# Patient Record
Sex: Female | Born: 1950 | Race: Black or African American | Hispanic: No | Marital: Single | Smoking: Never smoker
Health system: Southern US, Community
[De-identification: ages and names within clinical notes are randomized; demographics above are authoritative.]

## PROBLEM LIST (undated history)

## (undated) DIAGNOSIS — E785 Hyperlipidemia, unspecified: Secondary | ICD-10-CM

## (undated) DIAGNOSIS — M6281 Muscle weakness (generalized): Secondary | ICD-10-CM

## (undated) DIAGNOSIS — I69991 Dysphagia following unspecified cerebrovascular disease: Secondary | ICD-10-CM

## (undated) DIAGNOSIS — R1312 Dysphagia, oropharyngeal phase: Secondary | ICD-10-CM

## (undated) DIAGNOSIS — I10 Essential (primary) hypertension: Secondary | ICD-10-CM

## (undated) DIAGNOSIS — R41841 Cognitive communication deficit: Secondary | ICD-10-CM

## (undated) DIAGNOSIS — G8191 Hemiplegia, unspecified affecting right dominant side: Secondary | ICD-10-CM

## (undated) DIAGNOSIS — Z853 Personal history of malignant neoplasm of breast: Secondary | ICD-10-CM

## (undated) DIAGNOSIS — R2689 Other abnormalities of gait and mobility: Secondary | ICD-10-CM

## (undated) DIAGNOSIS — I639 Cerebral infarction, unspecified: Secondary | ICD-10-CM

---

## 2021-05-25 ENCOUNTER — Other Ambulatory Visit: Payer: Self-pay

## 2021-05-25 ENCOUNTER — Inpatient Hospital Stay (HOSPITAL_COMMUNITY): Payer: Medicare HMO

## 2021-05-25 ENCOUNTER — Inpatient Hospital Stay (HOSPITAL_COMMUNITY)
Admission: EM | Admit: 2021-05-25 | Discharge: 2021-05-27 | DRG: 065 | Disposition: A | Discharge status: Skilled Nursing Facility | Payer: Medicare HMO | Attending: Internal Medicine | Admitting: Internal Medicine

## 2021-05-25 ENCOUNTER — Emergency Department (HOSPITAL_COMMUNITY): Payer: Medicare HMO

## 2021-05-25 ENCOUNTER — Other Ambulatory Visit (HOSPITAL_COMMUNITY): Payer: Medicare HMO

## 2021-05-25 ENCOUNTER — Encounter: Payer: Self-pay | Admitting: Internal Medicine

## 2021-05-25 ENCOUNTER — Encounter (HOSPITAL_COMMUNITY): Payer: Self-pay | Admitting: Internal Medicine

## 2021-05-25 DIAGNOSIS — R29713 NIHSS score 13: Secondary | ICD-10-CM | POA: Diagnosis present

## 2021-05-25 DIAGNOSIS — Z20822 Contact with and (suspected) exposure to covid-19: Secondary | ICD-10-CM | POA: Diagnosis present

## 2021-05-25 DIAGNOSIS — E785 Hyperlipidemia, unspecified: Secondary | ICD-10-CM | POA: Diagnosis present

## 2021-05-25 DIAGNOSIS — R471 Dysarthria and anarthria: Secondary | ICD-10-CM | POA: Diagnosis present

## 2021-05-25 DIAGNOSIS — I69351 Hemiplegia and hemiparesis following cerebral infarction affecting right dominant side: Secondary | ICD-10-CM

## 2021-05-25 DIAGNOSIS — I69320 Aphasia following cerebral infarction: Secondary | ICD-10-CM

## 2021-05-25 DIAGNOSIS — Z79899 Other long term (current) drug therapy: Secondary | ICD-10-CM | POA: Diagnosis not present

## 2021-05-25 DIAGNOSIS — Z853 Personal history of malignant neoplasm of breast: Secondary | ICD-10-CM

## 2021-05-25 DIAGNOSIS — R29810 Facial weakness: Secondary | ICD-10-CM | POA: Diagnosis present

## 2021-05-25 DIAGNOSIS — R1310 Dysphagia, unspecified: Secondary | ICD-10-CM | POA: Diagnosis present

## 2021-05-25 DIAGNOSIS — G9340 Encephalopathy, unspecified: Secondary | ICD-10-CM | POA: Diagnosis present

## 2021-05-25 DIAGNOSIS — I639 Cerebral infarction, unspecified: Secondary | ICD-10-CM | POA: Diagnosis present

## 2021-05-25 DIAGNOSIS — I63511 Cerebral infarction due to unspecified occlusion or stenosis of right middle cerebral artery: Secondary | ICD-10-CM | POA: Diagnosis present

## 2021-05-25 DIAGNOSIS — Z7902 Long term (current) use of antithrombotics/antiplatelets: Secondary | ICD-10-CM

## 2021-05-25 DIAGNOSIS — I6389 Other cerebral infarction: Secondary | ICD-10-CM | POA: Diagnosis not present

## 2021-05-25 DIAGNOSIS — R569 Unspecified convulsions: Secondary | ICD-10-CM | POA: Diagnosis present

## 2021-05-25 DIAGNOSIS — R258 Other abnormal involuntary movements: Secondary | ICD-10-CM | POA: Diagnosis present

## 2021-05-25 DIAGNOSIS — I10 Essential (primary) hypertension: Secondary | ICD-10-CM | POA: Insufficient documentation

## 2021-05-25 DIAGNOSIS — Z888 Allergy status to other drugs, medicaments and biological substances status: Secondary | ICD-10-CM | POA: Diagnosis not present

## 2021-05-25 HISTORY — DX: Hyperlipidemia, unspecified: E78.5

## 2021-05-25 HISTORY — DX: Cerebral infarction, unspecified: I63.9

## 2021-05-25 HISTORY — DX: Essential (primary) hypertension: I10

## 2021-05-25 LAB — POCT I-STAT, CHEM 8
BUN: 11 mg/dL (ref 8–23)
Calcium, Ion: 1.17 mmol/L (ref 1.15–1.40)
Chloride: 108 mmol/L (ref 98–111)
Creatinine, Ser: 0.7 mg/dL (ref 0.44–1.00)
Glucose, Bld: 117 mg/dL — ABNORMAL HIGH (ref 70–99)
HCT: 43 % (ref 36.0–46.0)
Hemoglobin: 14.6 g/dL (ref 12.0–15.0)
Potassium: 4 mmol/L (ref 3.5–5.1)
Sodium: 144 mmol/L (ref 135–145)
TCO2: 26 mmol/L (ref 22–32)

## 2021-05-25 LAB — COMPREHENSIVE METABOLIC PANEL
ALT: 20 U/L (ref 0–44)
AST: 22 U/L (ref 15–41)
Albumin: 3.4 g/dL — ABNORMAL LOW (ref 3.5–5.0)
Alkaline Phosphatase: 89 U/L (ref 38–126)
Anion gap: 10 (ref 5–15)
BUN: 10 mg/dL (ref 8–23)
CO2: 24 mmol/L (ref 22–32)
Calcium: 9.5 mg/dL (ref 8.9–10.3)
Chloride: 109 mmol/L (ref 98–111)
Creatinine, Ser: 0.77 mg/dL (ref 0.44–1.00)
GFR, Estimated: >60 mL/min (ref >60)
Glucose, Bld: 116 mg/dL — ABNORMAL HIGH (ref 70–99)
Potassium: 4.1 mmol/L (ref 3.5–5.1)
Sodium: 143 mmol/L (ref 135–145)
Total Bilirubin: 0.3 mg/dL (ref 0.3–1.2)
Total Protein: 6.8 g/dL (ref 6.5–8.1)

## 2021-05-25 LAB — CBC
HCT: 42.4 % (ref 36.0–46.0)
Hemoglobin: 12.7 g/dL (ref 12.0–15.0)
MCH: 28 pg (ref 26.0–34.0)
MCHC: 30 g/dL (ref 30.0–36.0)
MCV: 93.6 fL (ref 80.0–100.0)
Platelets: 280 10*3/uL (ref 150–400)
RBC: 4.53 MIL/uL (ref 3.87–5.11)
RDW: 14.4 % (ref 11.5–15.5)
WBC: 6.6 10*3/uL (ref 4.0–10.5)
nRBC: 0 % (ref 0.0–0.2)

## 2021-05-25 LAB — DIFFERENTIAL
Abs Immature Granulocytes: 0.01 10*3/uL (ref 0.00–0.07)
Basophils Absolute: 0 10*3/uL (ref 0.0–0.1)
Basophils Relative: 1 %
Eosinophils Absolute: 0.1 10*3/uL (ref 0.0–0.5)
Eosinophils Relative: 2 %
Immature Granulocytes: 0 %
Lymphocytes Relative: 36 %
Lymphs Abs: 2.4 10*3/uL (ref 0.7–4.0)
Monocytes Absolute: 0.5 10*3/uL (ref 0.1–1.0)
Monocytes Relative: 7 %
Neutro Abs: 3.6 10*3/uL (ref 1.7–7.7)
Neutrophils Relative %: 54 %

## 2021-05-25 LAB — PROTIME-INR
INR: 1.1 (ref 0.8–1.2)
Prothrombin Time: 13.8 seconds (ref 11.4–15.2)

## 2021-05-25 LAB — RESP PANEL BY RT-PCR (FLU A&B, COVID) ARPGX2
Influenza A by PCR: NEGATIVE
Influenza B by PCR: NEGATIVE
SARS Coronavirus 2 by RT PCR: NEGATIVE

## 2021-05-25 LAB — ETHANOL: Alcohol, Ethyl (B): <10 mg/dL (ref <10)

## 2021-05-25 LAB — CBG MONITORING, ED: Glucose-Capillary: 121 mg/dL — ABNORMAL HIGH (ref 70–99)

## 2021-05-25 LAB — APTT: aPTT: 26 seconds (ref 24–36)

## 2021-05-25 IMAGING — MR MR HEAD W/O CM
4 series · 48 of 48 positions shown · non-contrast
Comparison: Noncontrast head CT performed earlier today [DATE].
Brain MRI [DATE]. CT angiogram head/neck [DATE].

CLINICAL DATA: Neuro deficit, acute, stroke suspected. Additional
history provided: Patient reports worsening right-sided weakness and
aphasia.

EXAM:
MRI HEAD WITHOUT CONTRAST
TECHNIQUE: Multiplanar, multiecho pulse sequences of the brain and surrounding
structures were obtained without intravenous contrast.

[Series 4: DWI · axial · 3.0mm · 1.09mm/px · z∈[-71,+78]mm · 18 of 102 slices shown (1 of 4)]
[im 1/102]
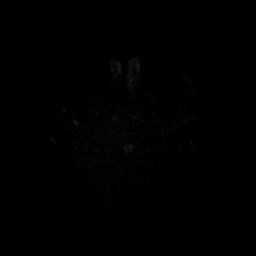
[im 6/102]
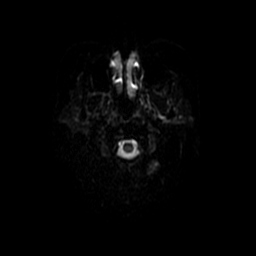
[im 12/102]
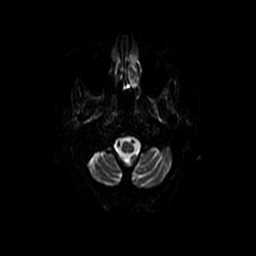
[im 18/102]
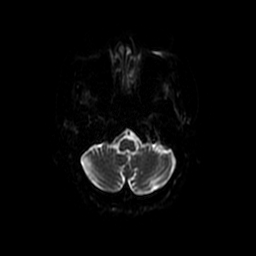
[im 24/102]
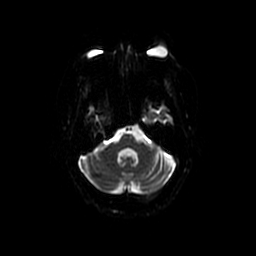
[im 30/102]
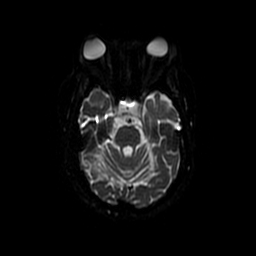
[im 36/102]
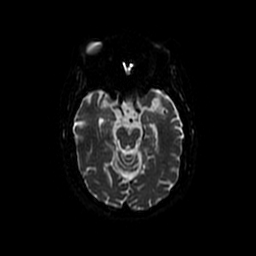
[im 42/102]
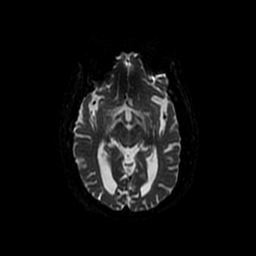
[im 48/102]
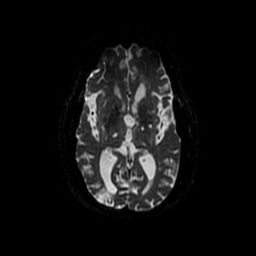
[im 54/102]
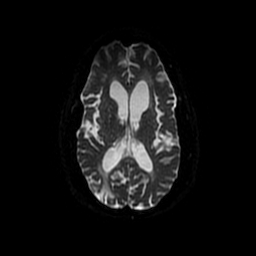
[im 60/102]
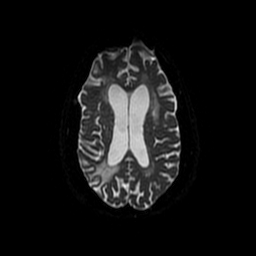
[im 66/102]
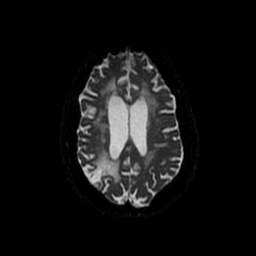
[im 72/102]
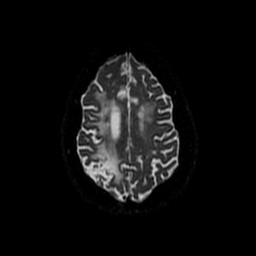
[im 78/102]
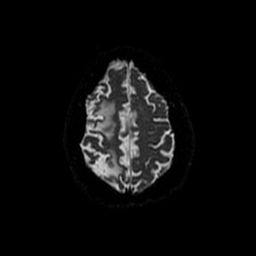
[im 84/102]
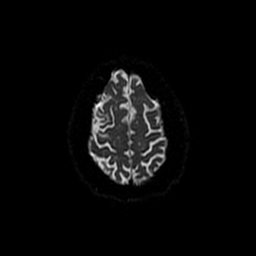
[im 90/102]
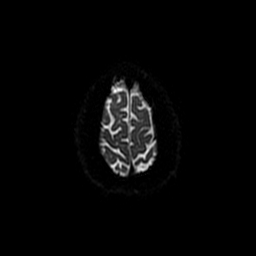
[im 96/102]
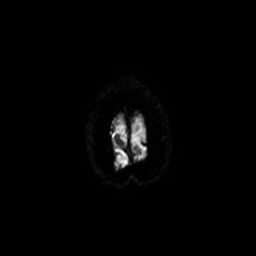
[im 102/102]
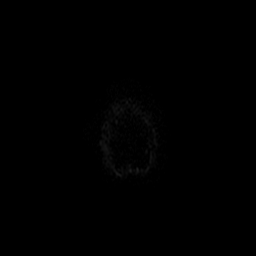

[Series 10: DWI · coronal · 5.0mm · 1.09mm/px · 14 of 80 slices shown (2 of 4)]
[im 1/80]
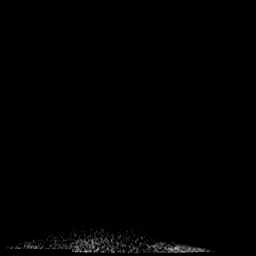
[im 7/80]
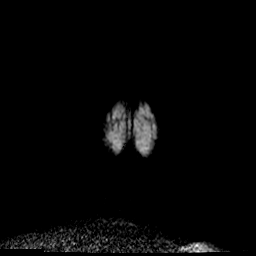
[im 13/80]
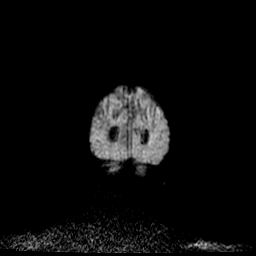
[im 19/80]
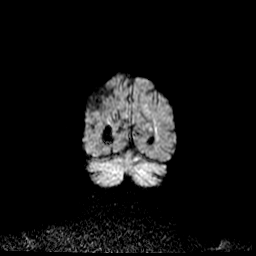
[im 25/80]
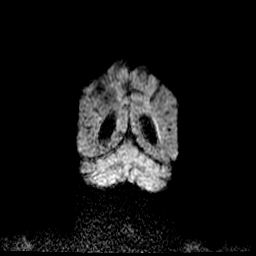
[im 31/80]
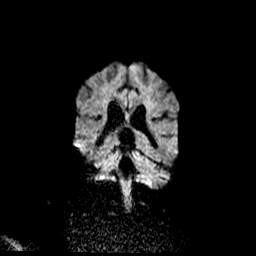
[im 37/80]
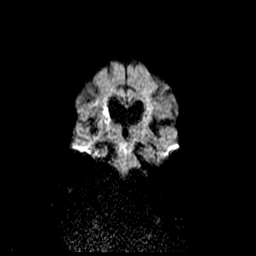
[im 43/80]
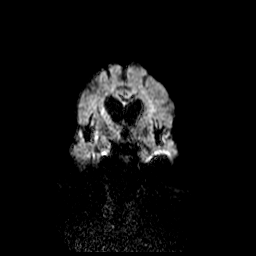
[im 49/80]
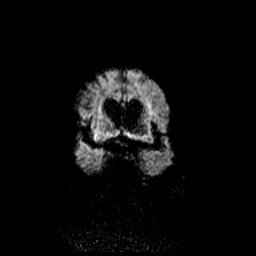
[im 55/80]
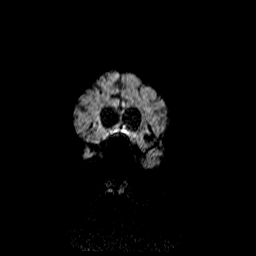
[im 61/80]
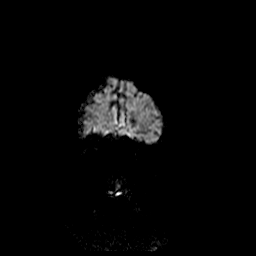
[im 67/80]
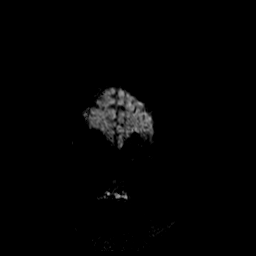
[im 73/80]
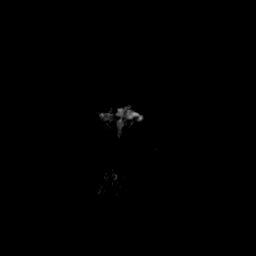
[im 80/80]
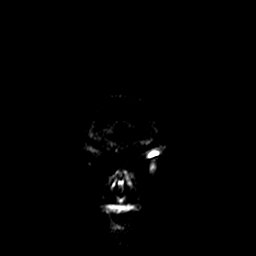

[Series 400: DWI · axial · 3.0mm · 1.09mm/px · z∈[-71,+78]mm · 9 of 51 slices shown (3 of 4)]
[im 1/51]
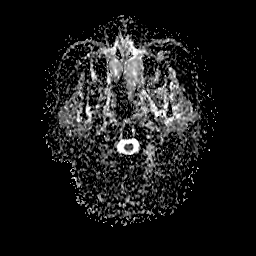
[im 7/51]
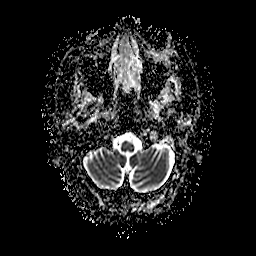
[im 13/51]
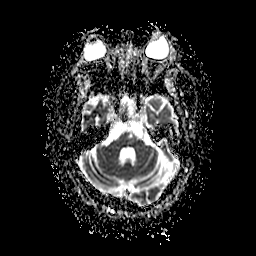
[im 19/51]
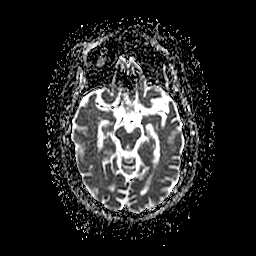
[im 26/51]
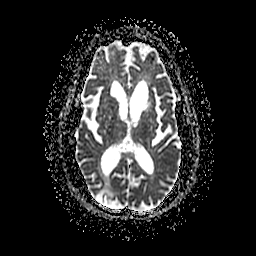
[im 32/51]
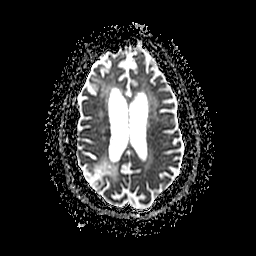
[im 38/51]
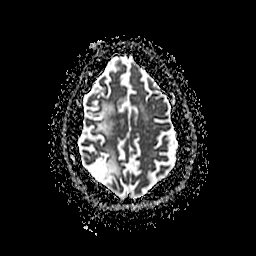
[im 44/51]
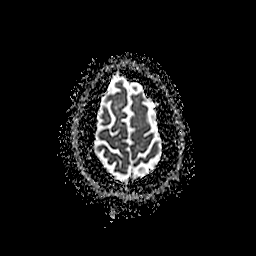
[im 51/51]
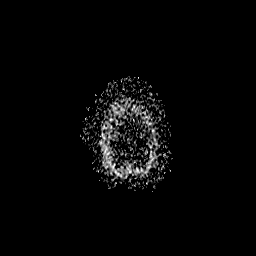

[Series 1000: DWI · coronal · 5.0mm · 1.09mm/px · 7 of 40 slices shown (4 of 4)]
[im 1/40]
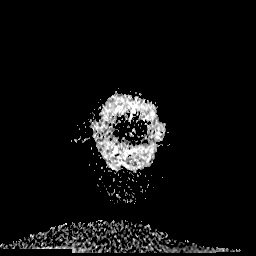
[im 7/40]
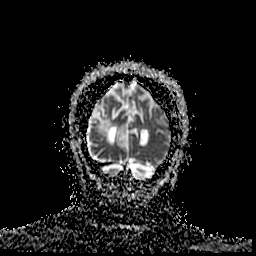
[im 14/40]
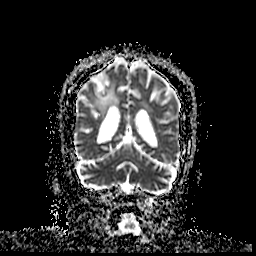
[im 20/40]
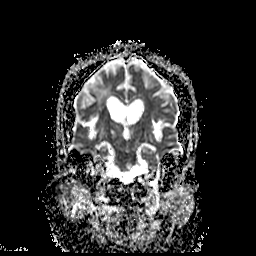
[im 27/40]
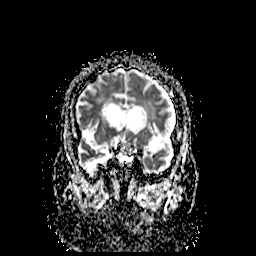
[im 33/40]
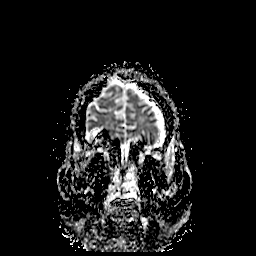
[im 40/40]
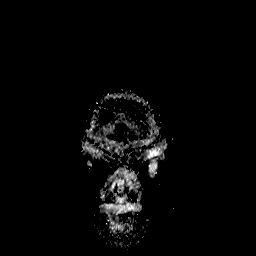

[48 of 48 positions shown; findings below may reference images not displayed]

FINDINGS: The patient was unable to tolerate the full examination. As a
result, only axial and coronal diffusion-weighted sequences (and the
corresponding ADC sequences) could be obtained. There is moderate
motion degradation of the coronal diffusion-weighted sequence. 2.0 x
0.8 x 1.8 cm focus of restricted diffusion within the right corona
radiata compatible with acute infarction.

No acute infarct is identified elsewhere within the brain.
IMPRESSION: The patient was unable to tolerate the full examination. As a
result, only diffusion-weighted imaging could be obtained. The
acquired coronal diffusion-weighted sequence is moderately motion
degraded.

2.0 x 0.8 x 1.8 cm acute infarct within the right corona radiata.

No acute infarct identified elsewhere within the brain.

## 2021-05-25 IMAGING — MR MR MRA NECK W/O CM
3 series · 19 of 48 positions shown · non-contrast
Comparison: CT angiogram neck [DATE].

EXAM:
MRA NECK WITHOUT CONTRAST
TECHNIQUE: Multiplanar and multiecho pulse sequences of the neck were obtained
without intravenous contrast. Angiographic images of the neck were
obtained using MRA technique without and with intravenous contrast.

[Series 8: ax (id) · axial · 2.8mm · 0.47mm/px · z∈[-258,-74]mm · 12 of 140 slices shown]
[im 1/140]
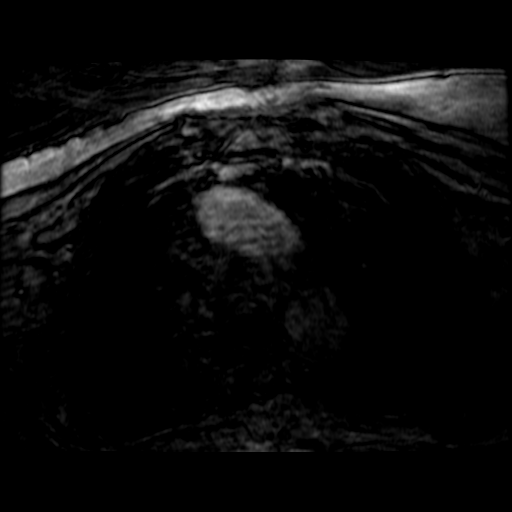
[im 7/140]
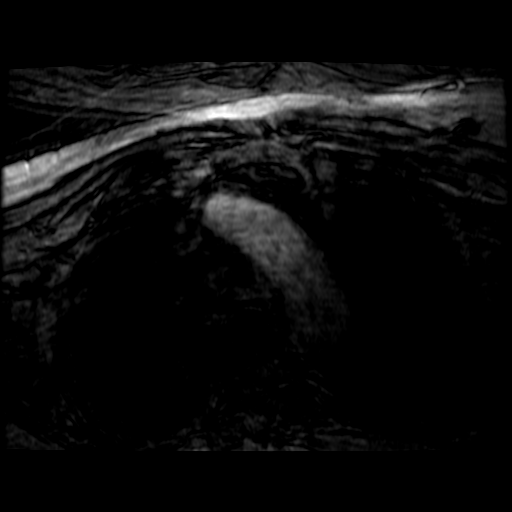
[im 21/140]
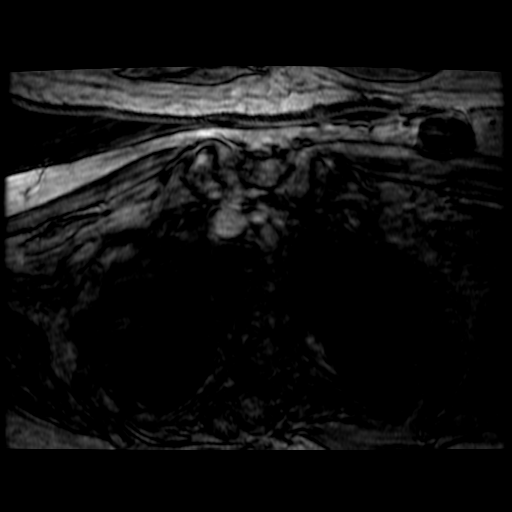
[im 25/140]
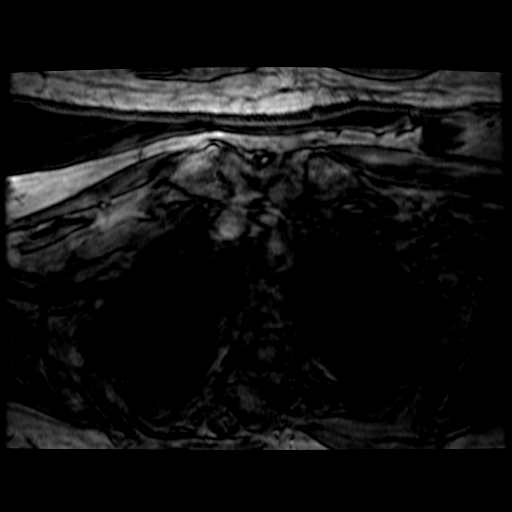
[im 42/140]
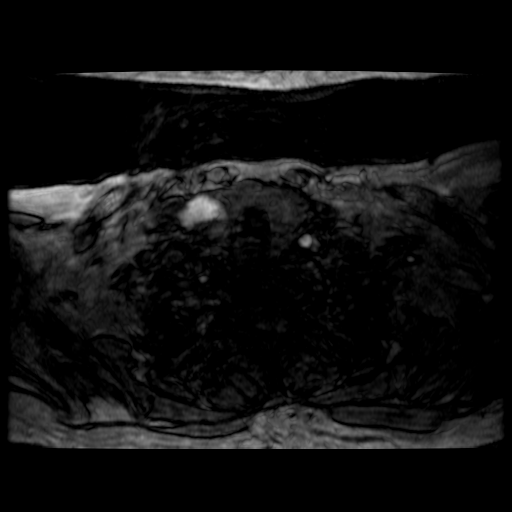
[im 60/140]
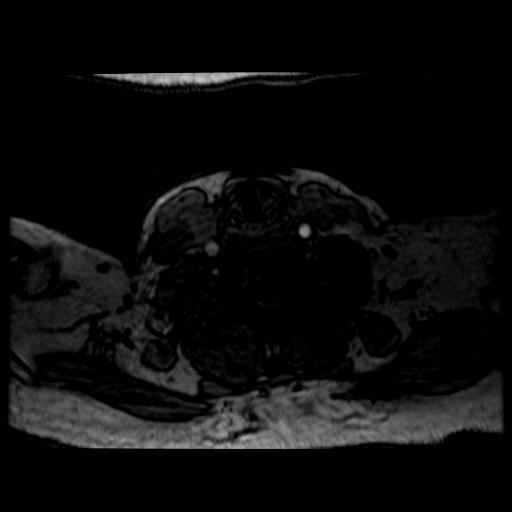
[im 70/140]
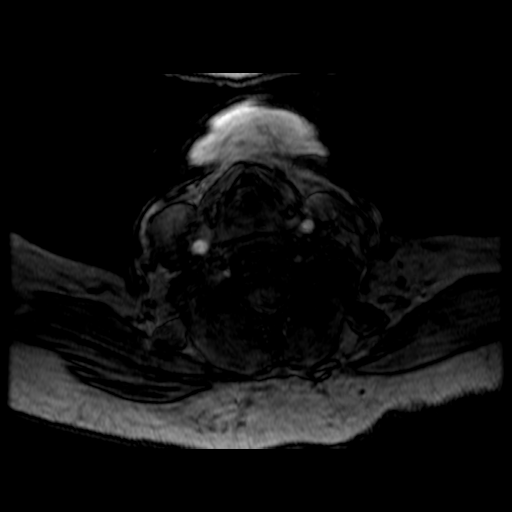
[im 80/140]
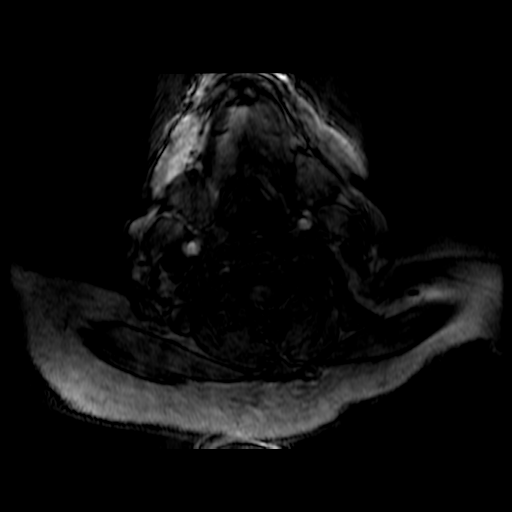
[im 98/140]
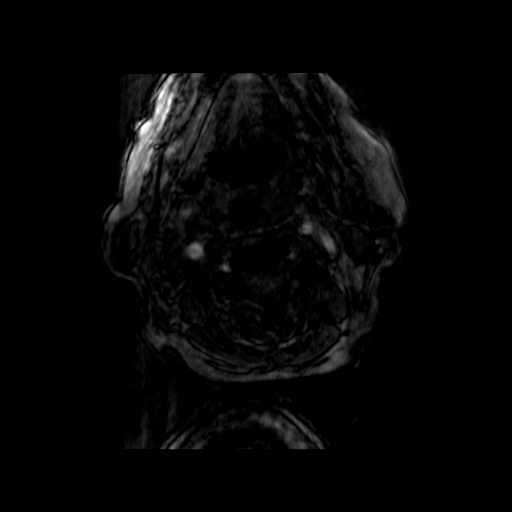
[im 115/140]
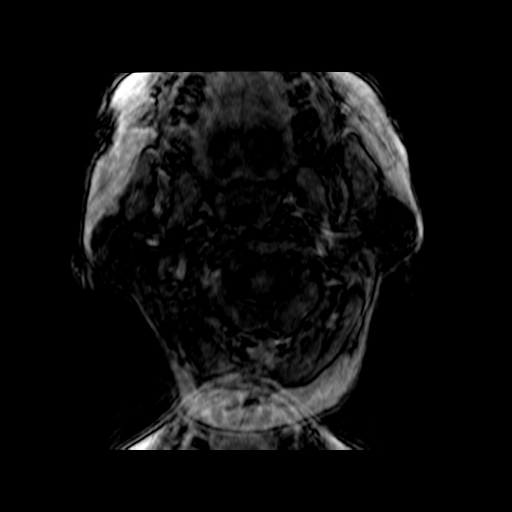
[im 119/140]
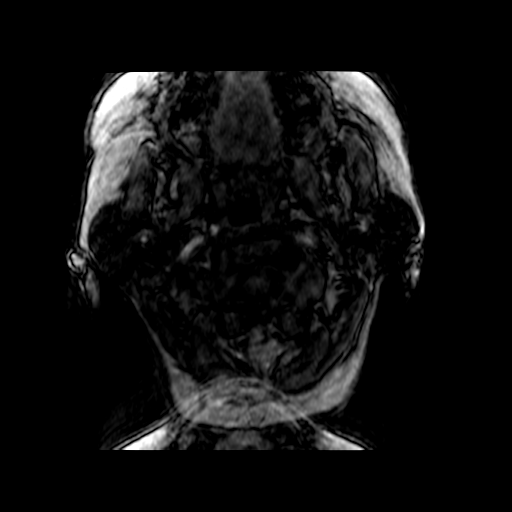
[im 133/140]
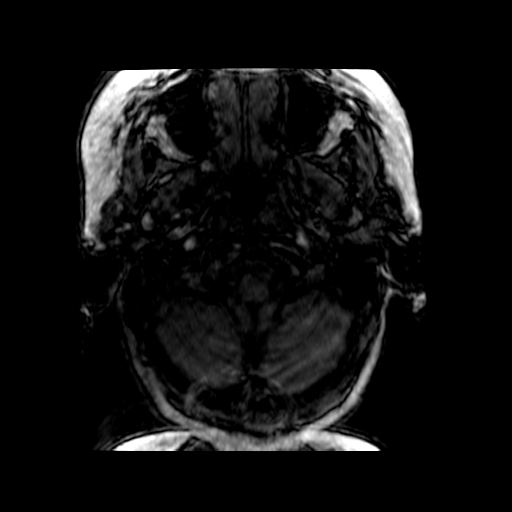

[Series 800: col:ax (id) · axial · 2.8mm · 0.47mm/px · 1 of 1 slices shown]
[im 1/1]
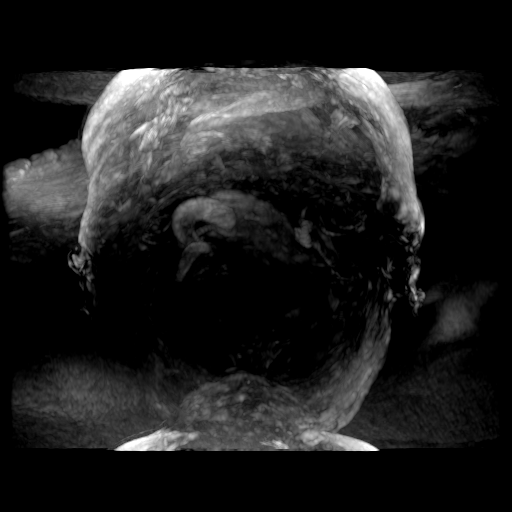

[Series 801: pjn:ax (id) · sagittal · 2.8mm · 0.47mm/px · 6 of 19 slices shown]
[im 1/19]
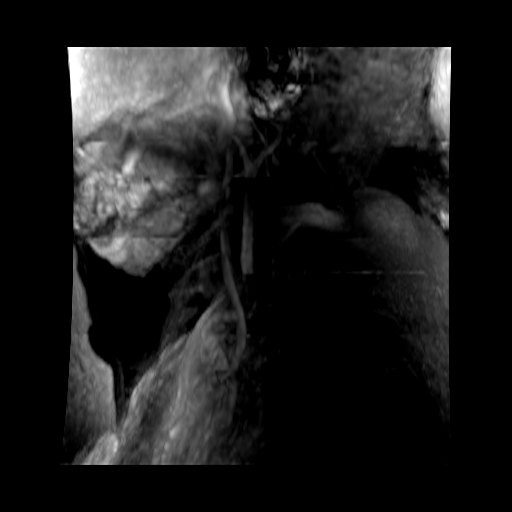
[im 4/19]
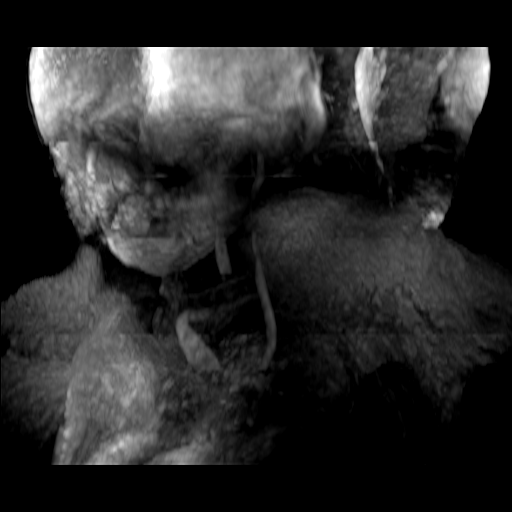
[im 8/19]
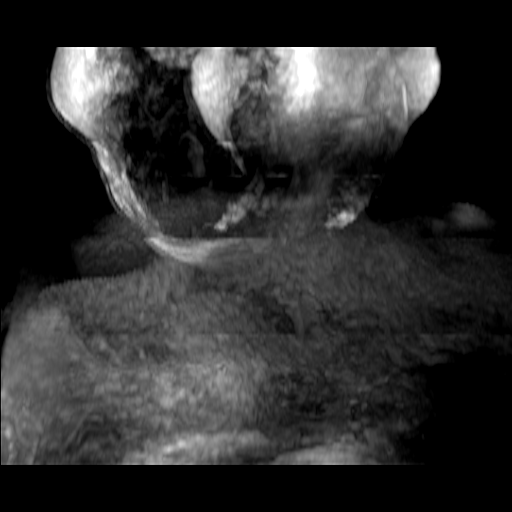
[im 11/19]
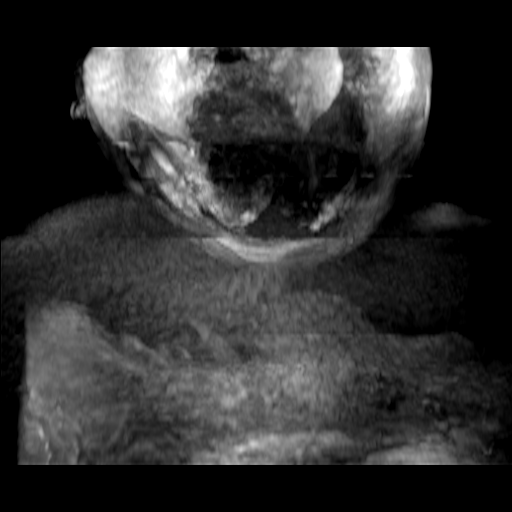
[im 15/19]
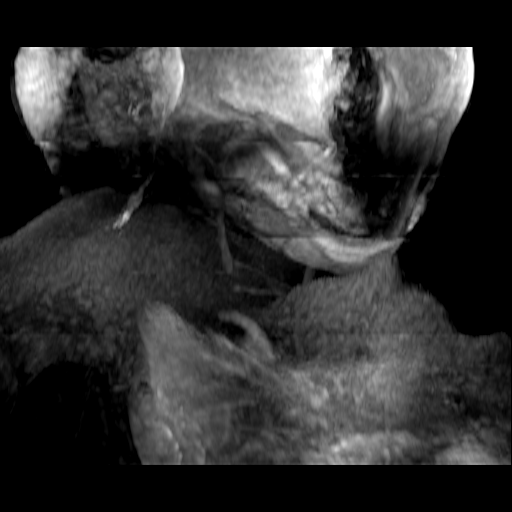
[im 19/19]
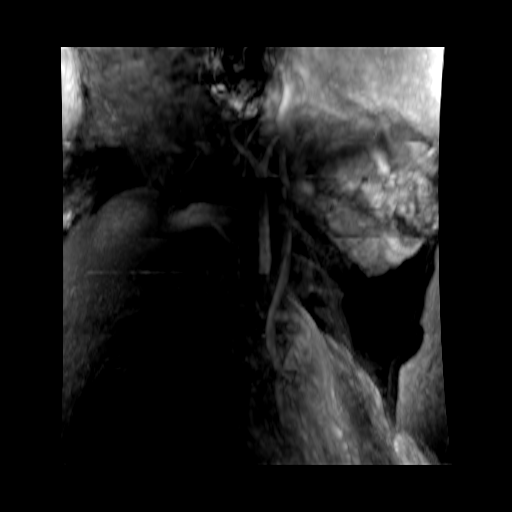

[19 of 48 positions shown; findings below may reference images not displayed]

FINDINGS: Examination significantly limited by moderate to severe motion
degradation and noncontrast technique. The common carotid and
internal carotid arteries are patent within the neck. Within
described limitations, no definite hemodynamically significant
stenosis of the common carotid or internal carotid arteries within
the neck is identified.

The vertebral arteries are patent within the neck with antegrade
flow bilaterally. Motion degradation significantly limits evaluation
for stenoses within these vessels. The right vertebral artery is
dominant.
IMPRESSION: Examination significantly limited by moderate to severe motion
degradation and non-contrast technique.

The common carotid and internal carotid arteries are patent within
the neck. Within described limitations, no definite hemodynamically
significant stenosis of the common carotid or internal carotid
arteries is identified within the neck.

The vertebral arteries are patent within the neck with antegrade
flow bilaterally. Motion degradation significantly limits evaluation
for stenoses within these vessels.

## 2021-05-25 IMAGING — CT CT HEAD CODE STROKE
4 series · 16 of 47 positions shown, 18 images · non-contrast
Comparison: None.

CLINICAL DATA: Code stroke.  Comment

EXAM:
CT HEAD WITHOUT CONTRAST
TECHNIQUE: Contiguous axial images were obtained from the base of the skull
through the vertex without intravenous contrast.

[Series 3: head 5.0 st · axial · 0.48mm/px · z∈[-76,+44]mm · 7 of 33 slices shown, 9 images]
[im 5/33  brain]
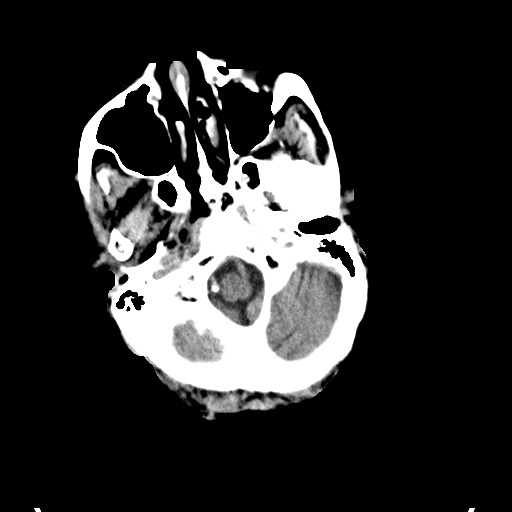
[im 5/33  bone]
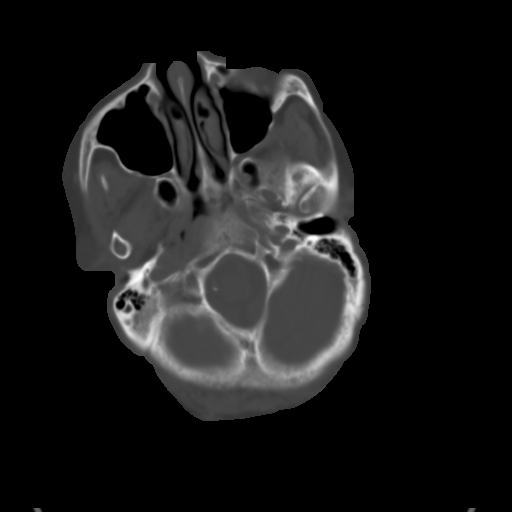
[im 9/33  brain]
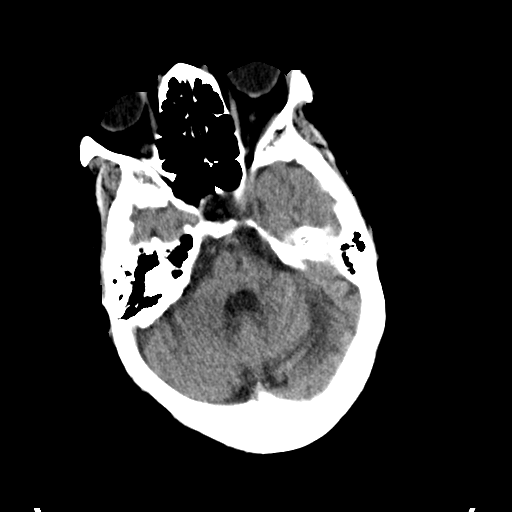
[im 13/33  brain]
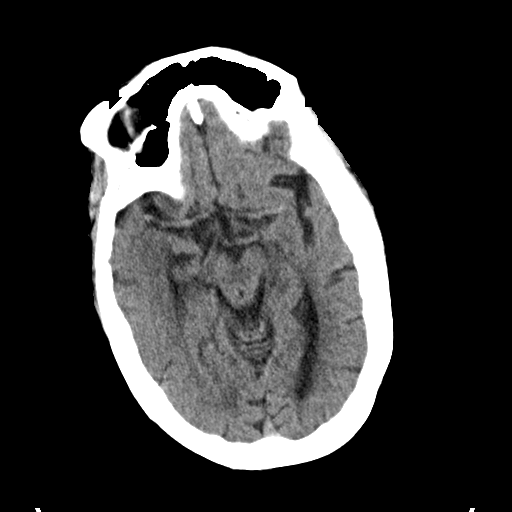
[im 17/33  brain]
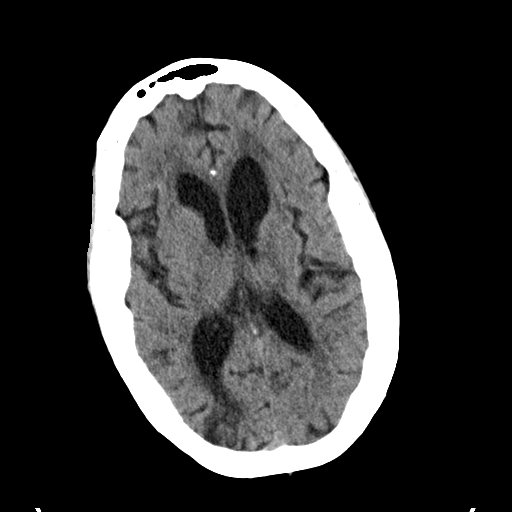
[im 21/33  brain]
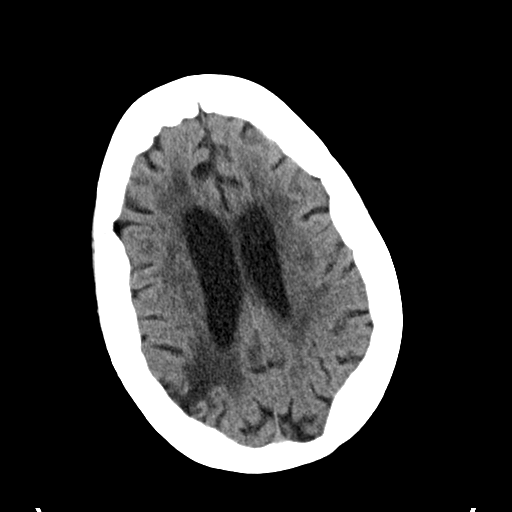
[im 21/33  bone]
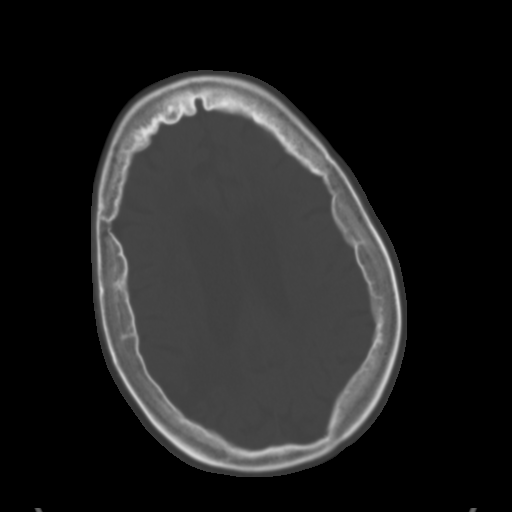
[im 25/33  brain]
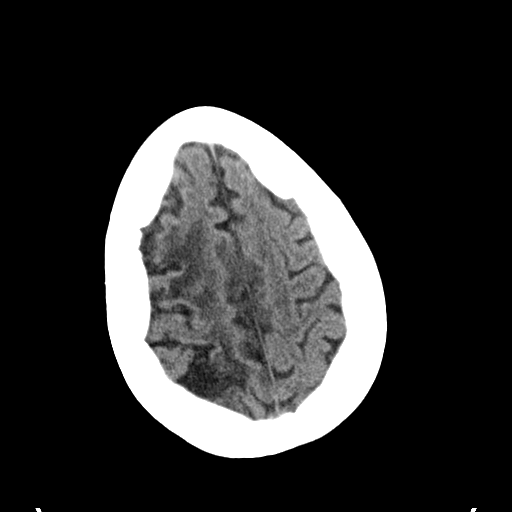
[im 29/33  brain]
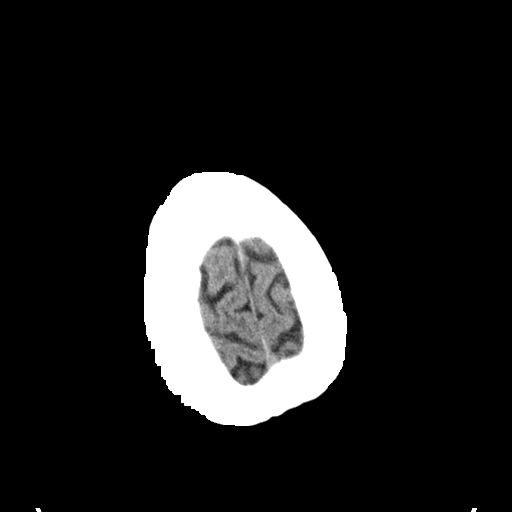

[Series 4: head 2.0 bone · axial · 0.48mm/px · z∈[-80,-48]mm · 3 of 82 slices shown]
[im 9/82  bone]
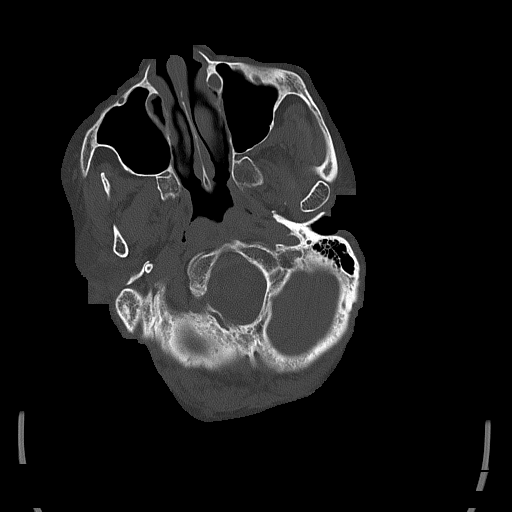
[im 17/82  bone]
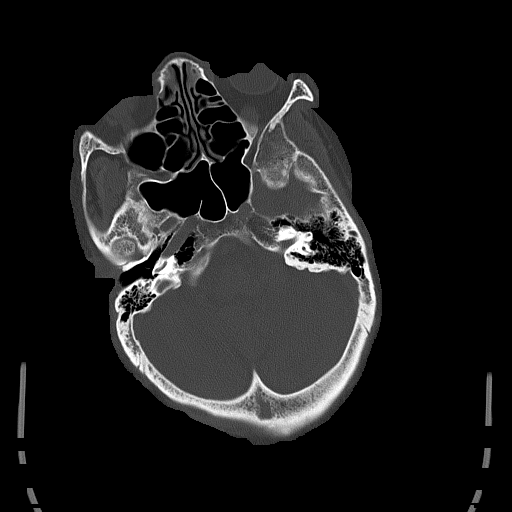
[im 25/82  bone]
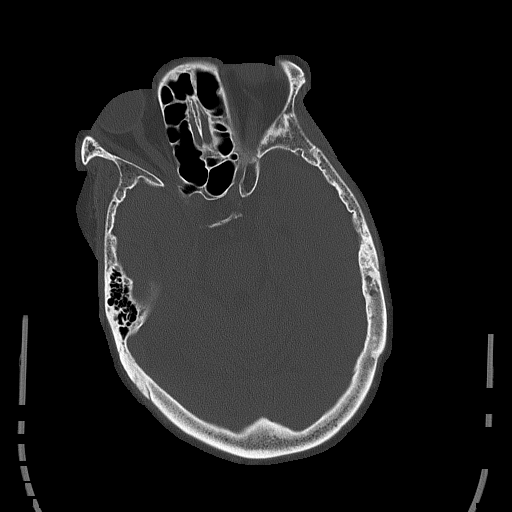

[Series 5: head 3.0 cor st · coronal · 0.35mm/px · 3 of 79 slices shown]
[im 27/79  brain]
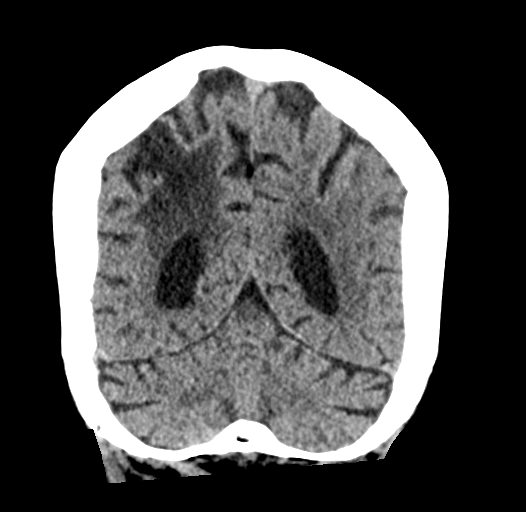
[im 35/79  brain]
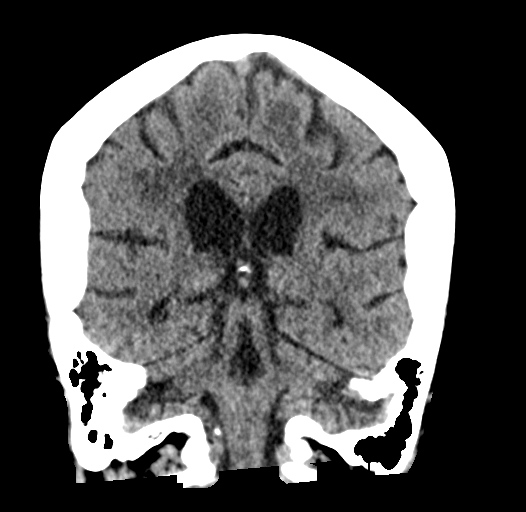
[im 44/79  brain]
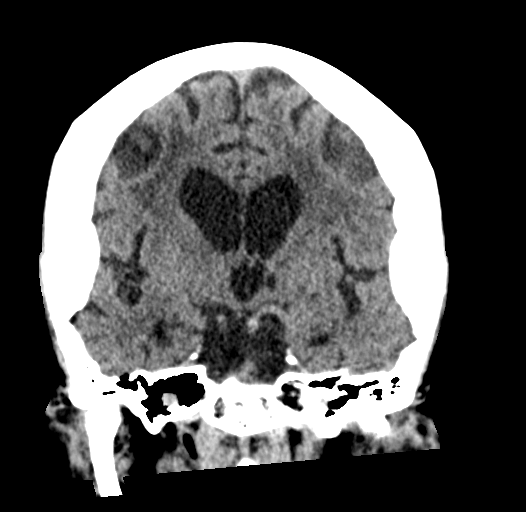

[Series 6: head 3.0 sag st · sagittal · 0.34mm/px · 3 of 67 slices shown]
[im 23/67  brain]
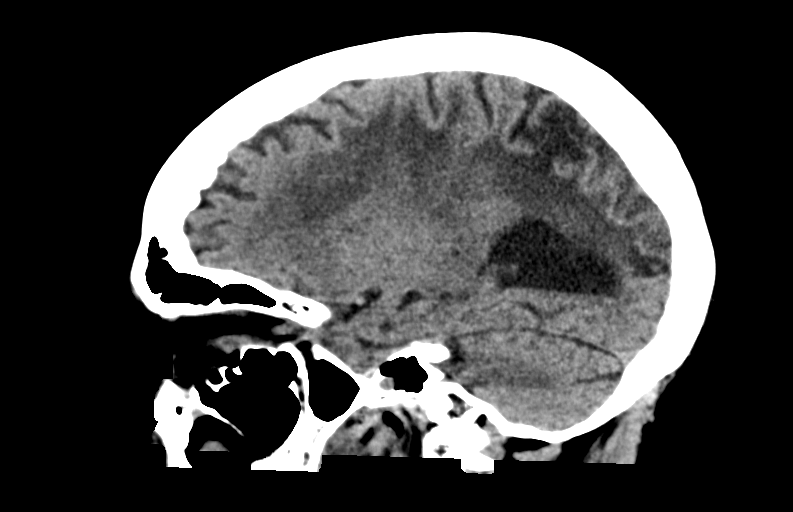
[im 34/67  brain]
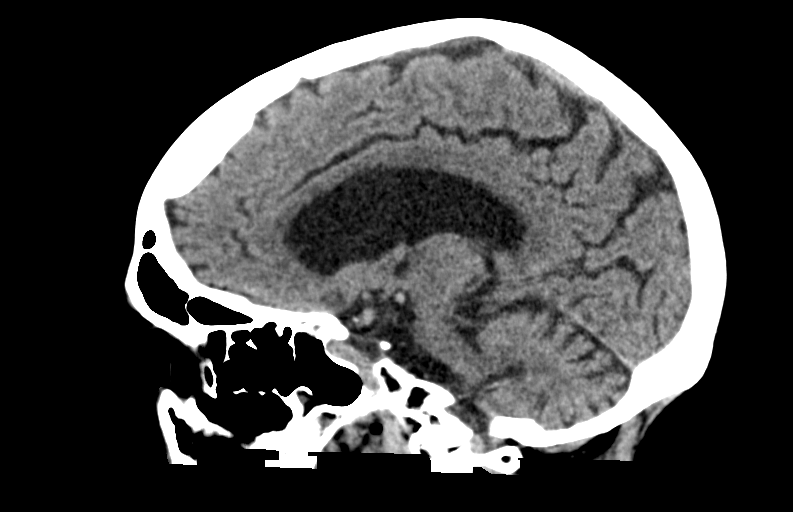
[im 45/67  brain]
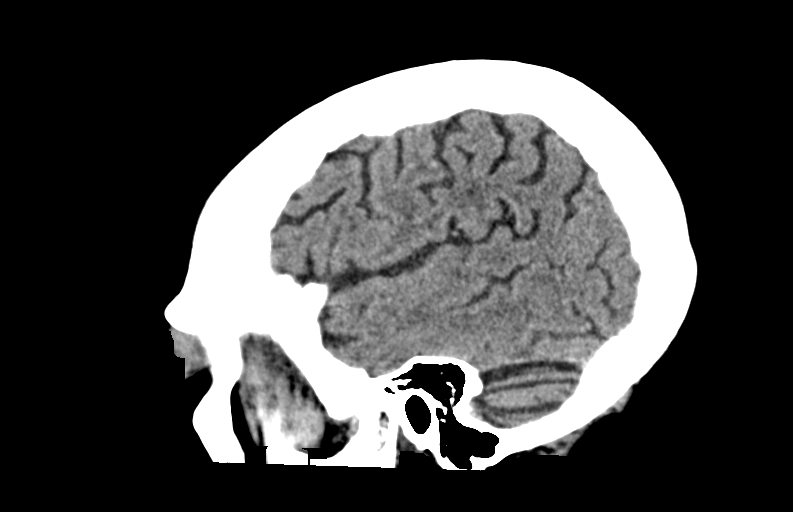

[16 of 47 positions shown; findings below may reference images not displayed]

FINDINGS: Brain: No evidence of acute large vascular territory infarction,
hemorrhage, hydrocephalus, extra-axial collection or mass
lesion/mass effect. Remote lacunar infarcts involving bilateral
thalami and basal ganglia, cerebellum, pons, and corona radiata.
Additional remote infarcts in the right MCA territory. Advanced
chronic microvascular ischemic disease. Moderate atrophy with ex
vacuo ventricular dilation.

Vascular: No hyperdense vessel identified.

Skull: No acute fracture.

Sinuses/Orbits: Clear sinuses.  Unremarkable orbits.

Other: No mastoid effusions.

ASPECTS ([place]ber[place] Stroke Program Early CT Score) total score (0-10
with 10 being normal): 10.
IMPRESSION: 1. No evidence of acute large vascular territory infarct or acute
hemorrhage. ASPECTS is 10.
2. Multiple mode infarcts and advanced chronic microvascular
ischemic disease.

Code stroke imaging results were communicated on [DATE] at [DATE] to provider Dr. WILKENS via [DATE] p.m.

## 2021-05-25 IMAGING — DX DG CHEST 1V PORT
1 series · 1 of 1 positions shown · non-contrast
Comparison: None.

CLINICAL DATA: Shortness of breath.

EXAM:
PORTABLE CHEST 1 VIEW

[chest]
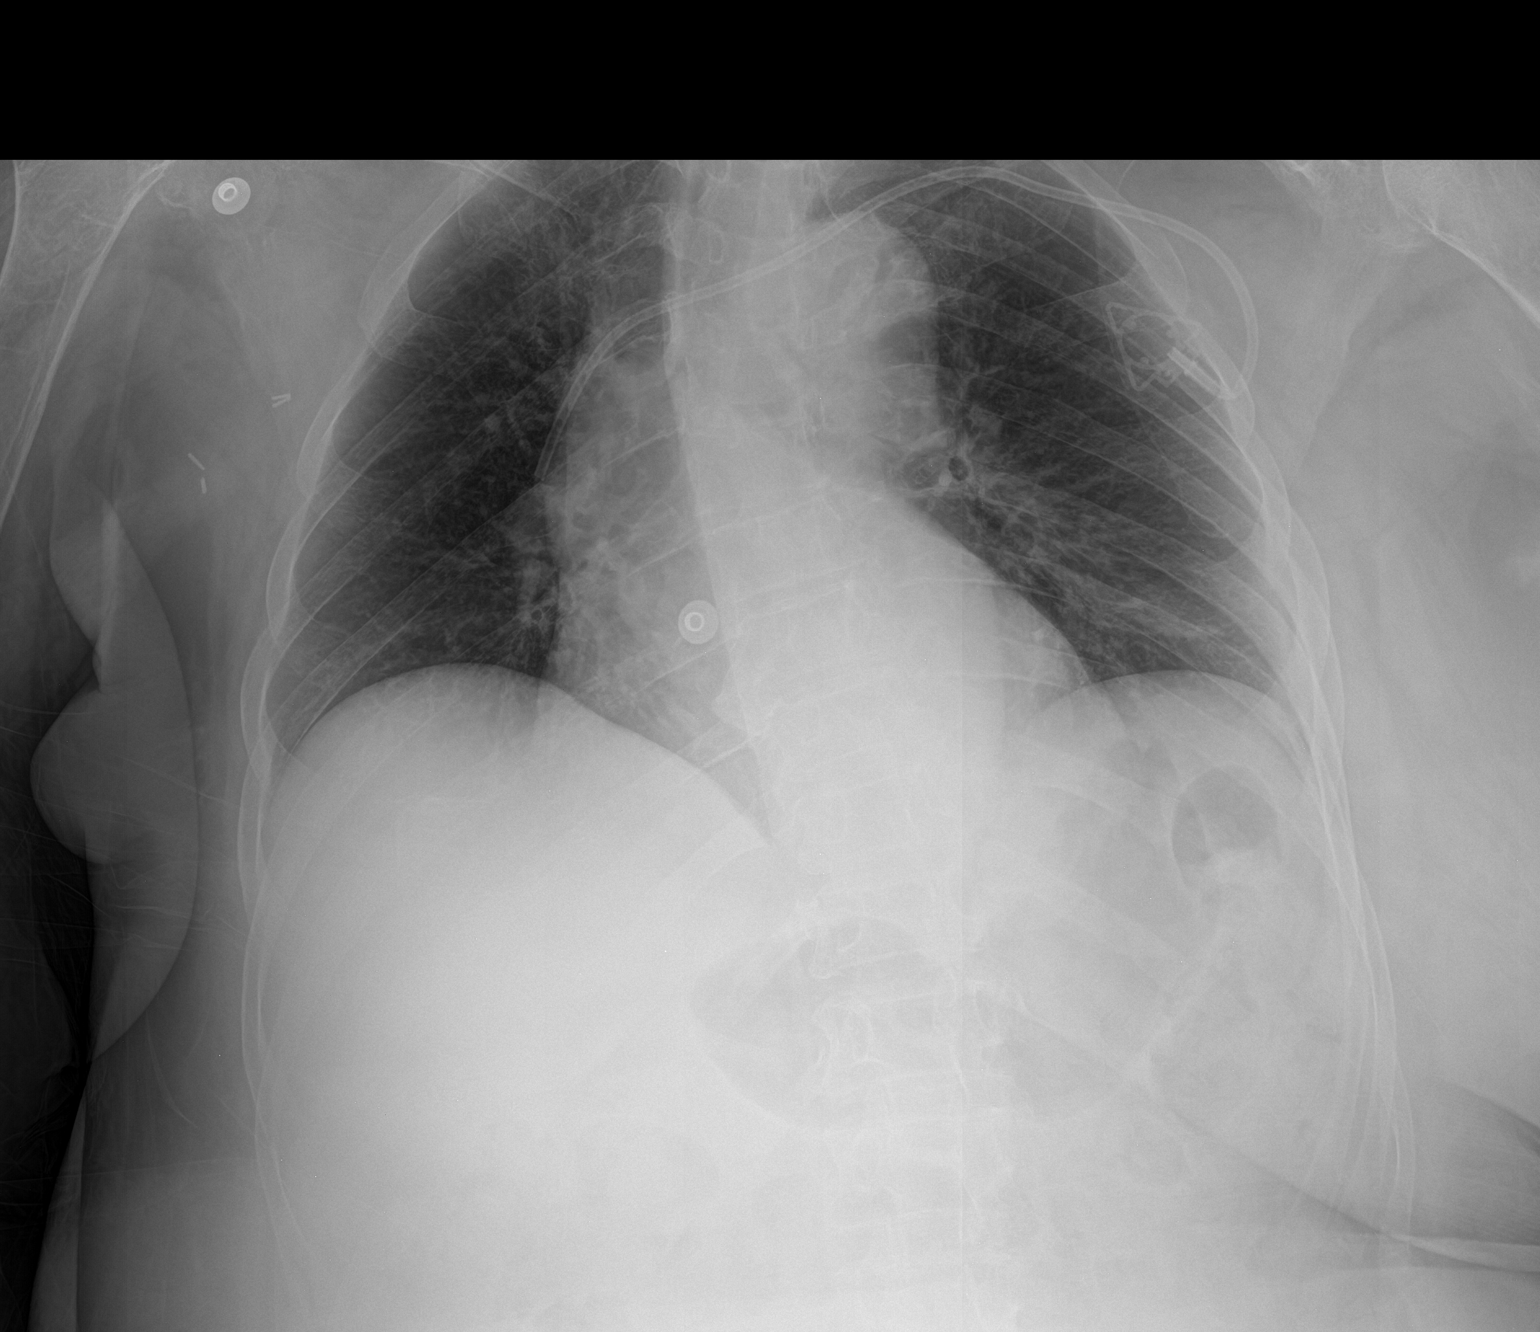

[1 of 1 positions shown; findings below may reference images not displayed]

FINDINGS: Left chest wall port a catheter noted with tip in the projection of
the SVC. Normal heart size. No pleural effusion or interstitial
edema. No airspace consolidation. Scoliosis deformity identified
within the thoracic spine, convex towards the right.
IMPRESSION: No acute cardiopulmonary abnormalities.

## 2021-05-25 IMAGING — MR MR MRA HEAD W/O CM
3 series · 12 of 48 positions shown · non-contrast
Comparison: Brain MRI [DATE]. CT angiogram head/neck
[DATE].

CLINICAL DATA: Neuro deficit, acute, stroke suspected. Additional
history provided: Worsening right-sided weakness and aphasia.

EXAM:
MRA HEAD WITHOUT CONTRAST
TECHNIQUE: Angiographic images of the Circle of Willis were acquired using MRA
technique without intravenous contrast.

[Series 3: (id) mt fs · axial · 1.4mm · 0.43mm/px · z∈[-70,+5]mm · 10 of 136 slices shown]
[im 7/136]
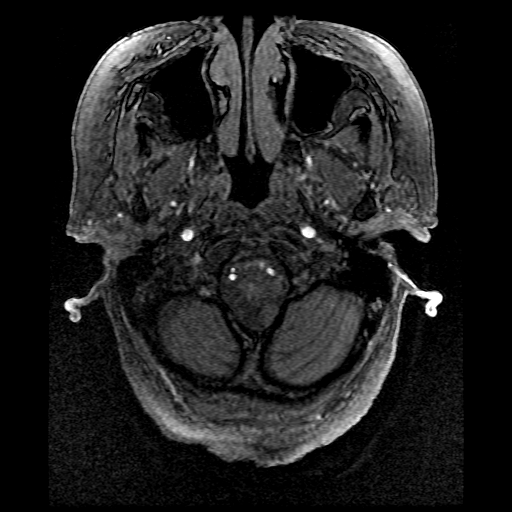
[im 22/136]
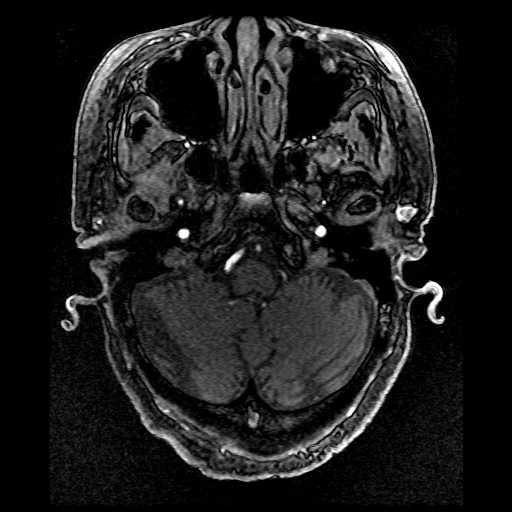
[im 25/136]
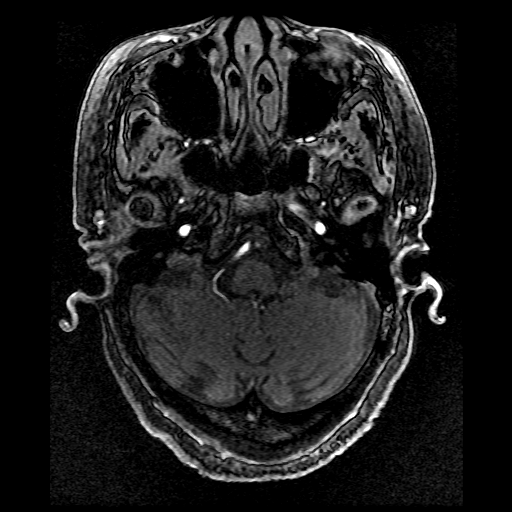
[im 43/136]
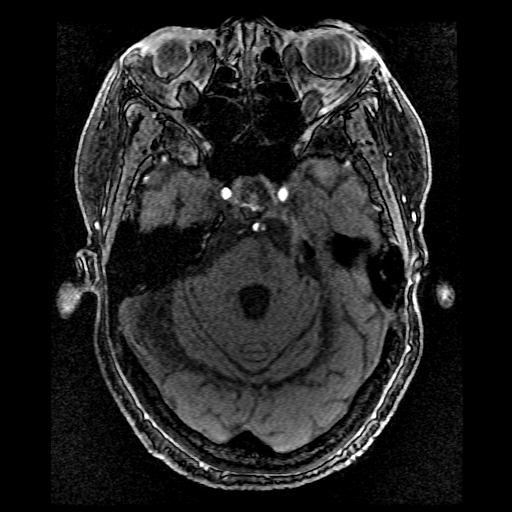
[im 61/136]
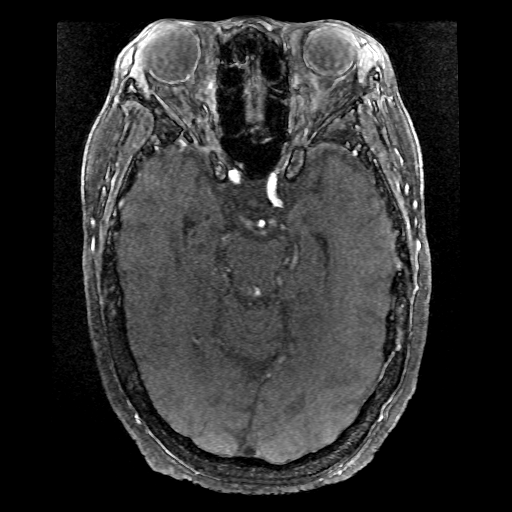
[im 70/136]
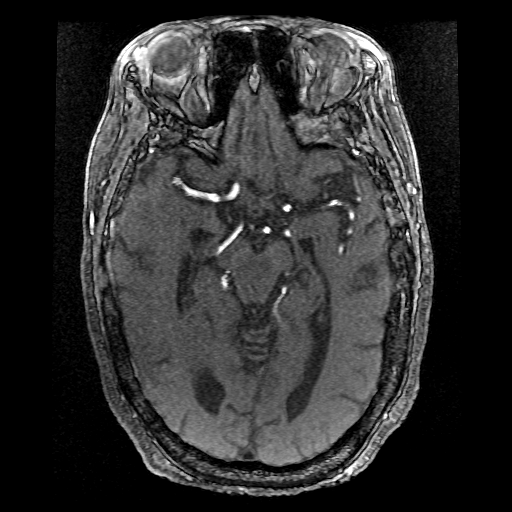
[im 76/136]
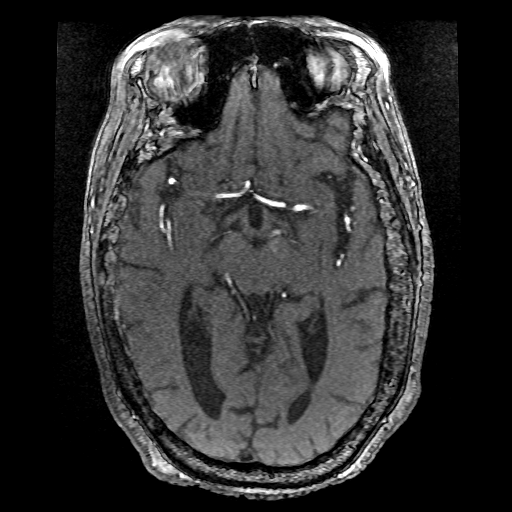
[im 94/136]
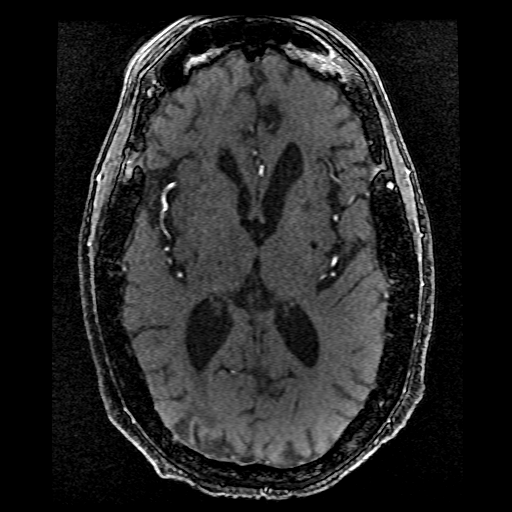
[im 112/136]
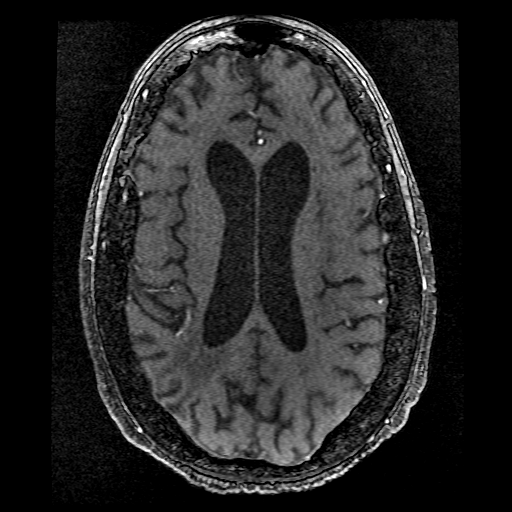
[im 115/136]
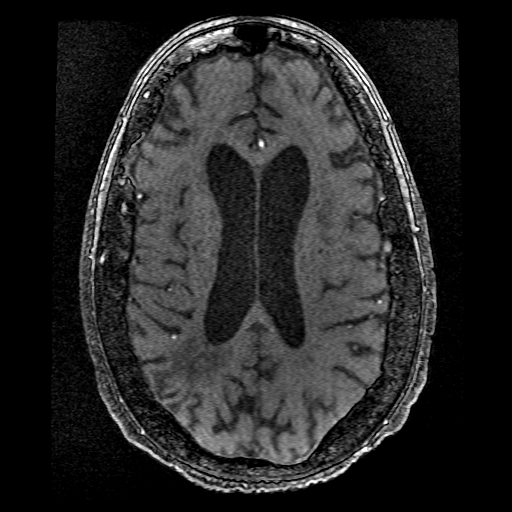

[Series 304: processed images · axial · 1.4mm · 0.21mm/px · 1 of 1 slices shown (1 of 2)]
[im 1/1]
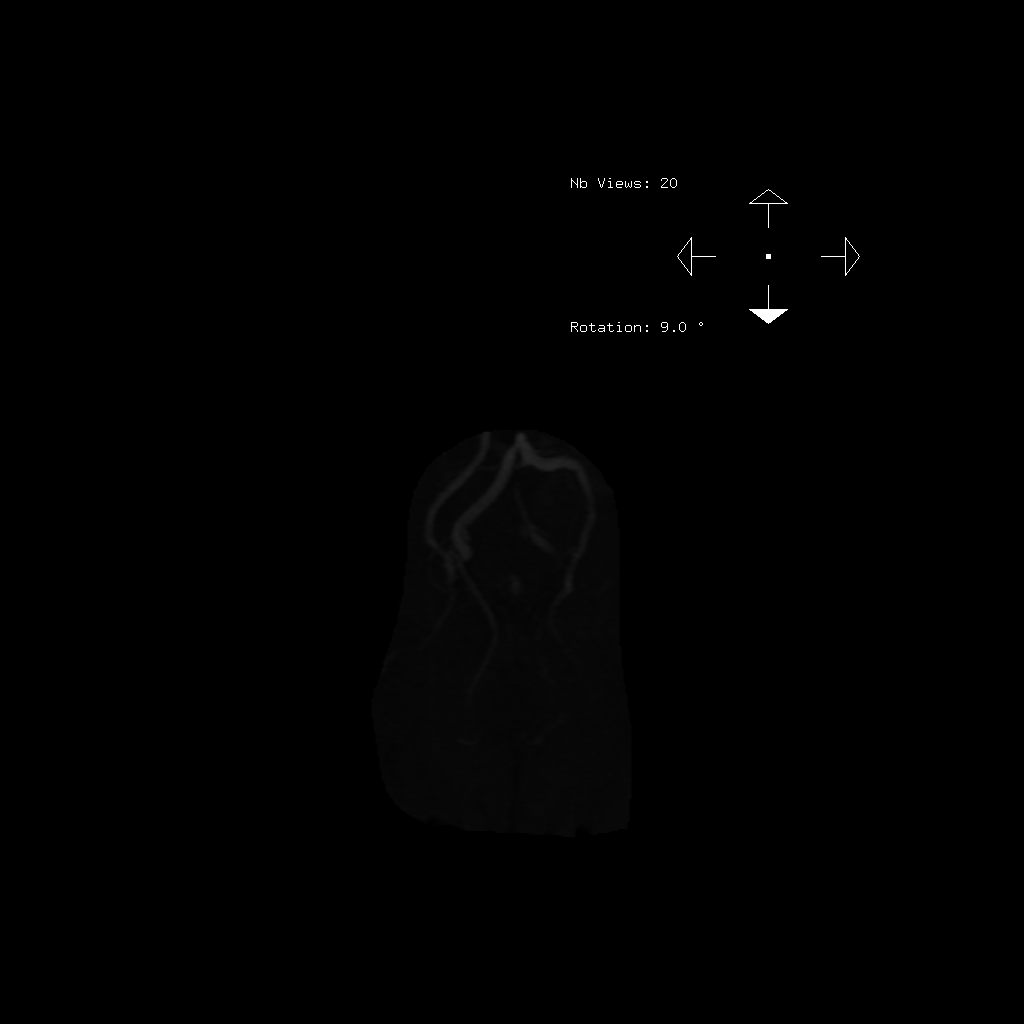

[Series 306: processed images · axial · 1.4mm · 0.21mm/px · 1 of 1 slices shown (2 of 2)]
[im 1/1]
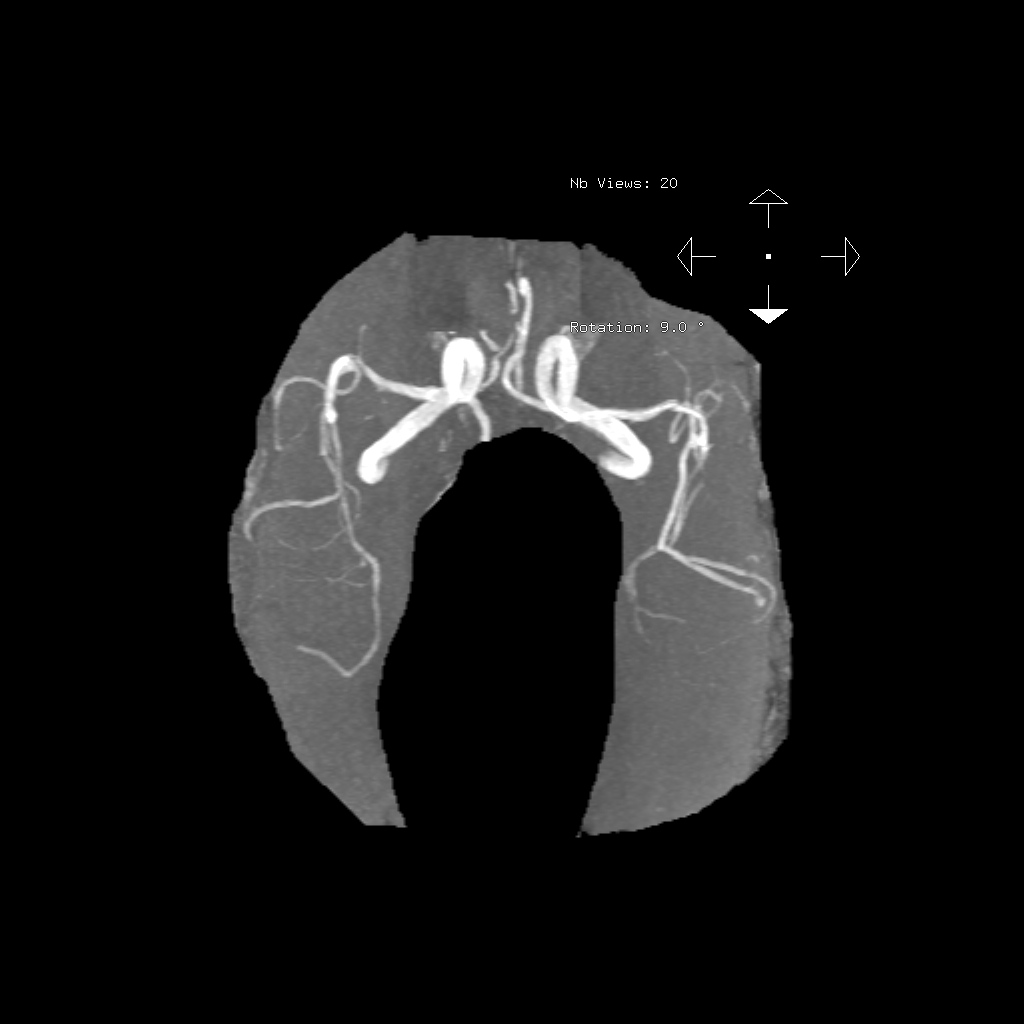

[12 of 48 positions shown; findings below may reference images not displayed]

FINDINGS: Anterior circulation:

The intracranial internal carotid arteries are patent. The M1 middle
cerebral arteries are patent. Progressive severe stenosis within the
distal M1 right middle cerebral artery. Atherosclerotic irregularity
of the M2 and more distal middle cerebral arteries bilaterally.
However, no M2 proximal branch occlusion or high-grade proximal M2
stenosis is identified. Redemonstrated multifocal severe stenoses
within the A2 and A3 right anterior cerebral artery. Flow related
enhancement is poorly appreciated within the distal anterior
cerebral artery. On the prior CTA head of [DATE], only minimal
thready enhancement of the distal right ACA was appreciated. The
left anterior cerebral artery is patent. New severe stenosis versus
artifact within the A3/A4 left anterior cerebral artery (series 307,
image 2).

Posterior circulation:

The dominant intracranial right vertebral artery is patent without
significant stenosis. The non dominant intracranial left vertebral
artery is irregular and poorly delineated beyond the left PICA
origin. This is at least partially related to developmentally small
vessel size. There may be superimposed high-grade stenosis. The
basilar artery is patent. The posterior cerebral arteries are
patent. Redemonstrated high-grade stenosis within the P3 right
posterior cerebral artery. Known high-grade stenosis within the P4
right PCA better appreciated on the prior CTA. The right posterior
cerebral artery is fetal in origin. The left posterior communicating
artery is hypoplastic or absent.

Anatomic variants: As described
IMPRESSION: Intracranial atherosclerotic disease with multifocal stenoses, most
notably as follows.

Progressive severe stenosis within the distal M1 right middle
cerebral artery.

Redemonstrated multifocal severe stenoses within the A2 and A3 right
anterior cerebral artery. Flow related enhancement is poorly
appreciated within the right anterior cerebral artery distal to
this. On the prior CTA of [DATE], this portion of the right
anterior cerebral artery demonstrated only minimal thready
enhancement.

New severe stenosis versus artifact within the A3/A4 left anterior
cerebral artery.

The non dominant intracranial left vertebral artery is irregular and
poorly delineated beyond the left PICA origin. This is at least
partially related to developmentally small vessel size. There may be
superimposed high-grade stenosis.

Known high-grade stenoses within the P3 and P4 right posterior
cerebral artery.

## 2021-05-25 MED ORDER — IRBESARTAN 150 MG PO TABS
150.0000 mg | ORAL_TABLET | Freq: Every day | ORAL | Status: DC
  Administered 2021-05-26 – 2021-05-27 (×2): 150 mg via ORAL
  Filled 2021-05-25 (×2): qty 1

## 2021-05-25 MED ORDER — SENNOSIDES-DOCUSATE SODIUM 8.6-50 MG PO TABS: 1.0000 | ORAL_TABLET | Freq: Every evening | ORAL | Status: DC | PRN

## 2021-05-25 MED ORDER — ATORVASTATIN CALCIUM 40 MG PO TABS
40.0000 mg | ORAL_TABLET | Freq: Every day | ORAL | Status: DC
  Administered 2021-05-25 – 2021-05-26 (×2): 40 mg via ORAL
  Filled 2021-05-25 (×2): qty 1

## 2021-05-25 MED ORDER — ACETAMINOPHEN 650 MG RE SUPP: 650.0000 mg | RECTAL | Status: DC | PRN

## 2021-05-25 MED ORDER — LEVETIRACETAM 500 MG PO TABS
500.0000 mg | ORAL_TABLET | Freq: Two times a day (BID) | ORAL | Status: DC
  Administered 2021-05-25 – 2021-05-27 (×4): 500 mg via ORAL
  Filled 2021-05-25 (×4): qty 1

## 2021-05-25 MED ORDER — ASPIRIN EC 81 MG PO TBEC
81.0000 mg | DELAYED_RELEASE_TABLET | Freq: Every day | ORAL | Status: DC
  Administered 2021-05-26 – 2021-05-27 (×2): 81 mg via ORAL
  Filled 2021-05-25 (×2): qty 1

## 2021-05-25 MED ORDER — ENOXAPARIN SODIUM 40 MG/0.4ML IJ SOSY
40.0000 mg | PREFILLED_SYRINGE | INTRAMUSCULAR | Status: DC
  Administered 2021-05-25 – 2021-05-26 (×2): 40 mg via SUBCUTANEOUS
  Filled 2021-05-25 (×2): qty 0.4

## 2021-05-25 MED ORDER — STROKE: EARLY STAGES OF RECOVERY BOOK: Freq: Once | Status: AC

## 2021-05-25 MED ORDER — MIRTAZAPINE 15 MG PO TABS
7.5000 mg | ORAL_TABLET | Freq: Every day | ORAL | Status: DC
  Administered 2021-05-25 – 2021-05-26 (×2): 7.5 mg via ORAL
  Filled 2021-05-25 (×2): qty 1

## 2021-05-25 MED ORDER — ACETAMINOPHEN 160 MG/5ML PO SOLN: 650.0000 mg | ORAL | Status: DC | PRN

## 2021-05-25 MED ORDER — CLOPIDOGREL BISULFATE 75 MG PO TABS
75.0000 mg | ORAL_TABLET | Freq: Every day | ORAL | Status: DC
  Administered 2021-05-26 – 2021-05-27 (×2): 75 mg via ORAL
  Filled 2021-05-25 (×2): qty 1

## 2021-05-25 MED ORDER — ACETAMINOPHEN 325 MG PO TABS: 650.0000 mg | ORAL_TABLET | ORAL | Status: DC | PRN

## 2021-05-25 NOTE — ED Notes (Signed)
Attempted to call report x 1  

## 2021-05-25 NOTE — H&P (Signed)
History and Physical    Brandi Stewart PZW:[id number] DOB: September 21, 1951 DOA: 05/25/2021  PCP: Pcp, No Consultants:  neurology: Dr. Arletha Grippe  Patient coming from:  [facility]  Chief Complaint: right sided weakness, right facial droop and acute worsening aphasia.   HPI: Brandi Stewart is a 70 y.o. 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 She also felt dizzy. EMS was called. Son is here to help with history.  He states his moms symptoms have improved but not back to baseline. She is right handed. History difficult from patient due to aphasia/dysarthria.   Son states that with stroke In [place] 2021 possibly had seizure and was put on medication. Per notes seizure could not be ruled out and keppra was started. EEG wtihout evidence of seizure. Seen at [facility] for previous strokes.   ED Course: code stroke intiated. Bp: 127/105, HR: 66, afebrile, RR: 16, oxygen: 98% on room air. No other pertinent labs. CTH with no acute infarct. Outside of tpa window. Neurology consulted. Asked to admit for stroke rule out  and management of medical conditions.   Review of Systems: As per HPI; otherwise review of systems reviewed and negative.   Ambulatory Status: wheelchair     Past Medical History:  Diagnosis Date   CVA (cerebral vascular accident) (HCC)    Dyslipidemia    Essential hypertension     History reviewed. No pertinent surgical history.  Social History   Socioeconomic History   Marital status: Single    Spouse name: Not on file   Number of children: Not on file   Years of education: Not on file   Highest education level: Not on file  Occupational History   Occupation: retired  Tobacco Use   Smoking status: Never   Smokeless tobacco: Never  Substance and Sexual Activity    Alcohol use: Not Currently   Drug use: Not Currently   Sexual activity: Not on file  Other Topics Concern   Not on file  Social History Narrative   Not on file   Social Determinants of Health   Financial Resource Strain: Not on file  Food Insecurity: Not on file  Transportation Needs: Not on file  Physical Activity: Not on file  Stress: Not on file  Social Connections: Not on file  Intimate Partner Violence: Not on file    Not on File  History reviewed. No pertinent family history.  Prior to Admission medications   Medication Sig Start Date End Date Taking? Authorizing Provider  atorvastatin (LIPITOR) 40 MG tablet Take 40 mg by mouth daily as needed. 05/17/21   [provider]  clopidogrel (PLAVIX) 75 MG tablet Take 75 mg by mouth daily. 05/17/21   [provider]  levETIRAcetam (KEPPRA) 500 MG tablet Take 500 mg by mouth 2 (two) times daily. 05/09/21   [provider]  mirtazapine (REMERON) 7.5 MG tablet Take 7.5 mg by mouth at bedtime. 05/12/21   [provider]  telmisartan (MICARDIS) 40 MG tablet Take 40 mg by mouth daily. 05/22/21   [provider]    Physical Exam: Vitals:   05/25/21 1502 05/25/21 1505 05/25/21 1545 05/25/21 1800  BP: (!) 127/105  (!) 150/86 (!) 160/92  Pulse: 66  67 62  Resp: 16  14 16   Temp: 97.7 F (36.5  C)   99.4 F (37.4 C)  TempSrc: Tympanic   Oral  SpO2: 98%  99% 100%  Weight:  77.1 kg    Height:  5\' 6"  (1.676 m)       General:  Appears calm and comfortable and is in NAD Eyes:  PERRL, EOMI, normal lids, iris ENT:  grossly normal hearing, lips & tongue, mmm; appropriate dentition Neck:  no LAD, masses or thyromegaly; no carotid bruits Cardiovascular:  RRR, no m/r/g. Trace pedal edema.  Respiratory:   CTA bilaterally with no wheezes/rales/rhonchi.  Normal respiratory effort. Abdomen:  soft, NT, ND, NABS Back:   normal alignment, no CVAT Skin:  no rash or induration seen on limited  exam Musculoskeletal:  left upper and lower extremity with good tone and strength 4/5. Right upper and lower extremity with no strength 0/5. Can not move either. Slightly increased tone compared to left. DTR wnl. Right hand slightly contracted. no bony abnormality Lower extremity:  Limited foot exam with no ulcerations.  Thready-1+ distal pulses. Psychiatric:  grossly normal mood and affect aphasic speech with some dysarthria.  Neurologic:  CN 2-6 grossly intact. CN VII right sided facial droop, tongue with slight right sided deviation.  Right shoulder with minimal elevation compared to left. sensation intact . Can follow directions. Not oriented to place or date. Knows her son. Good laryngeal elevation with swallow.     Radiological Exams on Admission: Independently reviewed - see discussion in A/P where applicable  MR ANGIO HEAD WO CONTRAST  Result Date: 05/25/2021 CLINICAL DATA:  Neuro deficit, acute, stroke suspected. Additional history provided: Worsening right-sided weakness and aphasia. EXAM: MRA HEAD WITHOUT CONTRAST TECHNIQUE: Angiographic images of the Circle of Willis were acquired using MRA technique without intravenous contrast. COMPARISON:  Brain MRI 01/11/2021. CT angiogram head/neck 10/10/2020. FINDINGS: Anterior circulation: The intracranial internal carotid arteries are patent. The M1 middle cerebral arteries are patent. Progressive severe stenosis within the distal M1 right middle cerebral artery. Atherosclerotic irregularity of the M2 and more distal middle cerebral arteries bilaterally. However, no M2 proximal branch occlusion or high-grade proximal M2 stenosis is identified. Redemonstrated multifocal severe stenoses within the A2 and A3 right anterior cerebral artery. Flow related enhancement is poorly appreciated within the distal anterior cerebral artery. On the prior CTA head of 10/10/2020, only minimal thready enhancement of the distal right ACA was appreciated. The left  anterior cerebral artery is patent. New severe stenosis versus artifact within the A3/A4 left anterior cerebral artery (series 307, image 2). Posterior circulation: The dominant intracranial right vertebral artery is patent without significant stenosis. The non dominant intracranial left vertebral artery is irregular and poorly delineated beyond the left PICA origin. This is at least partially related to developmentally small vessel size. There may be superimposed high-grade stenosis. The basilar artery is patent. The posterior cerebral arteries are patent. Redemonstrated high-grade stenosis within the P3 right posterior cerebral artery. Known high-grade stenosis within the P4 right PCA better appreciated on the prior CTA. The right posterior cerebral artery is fetal in origin. The left posterior communicating artery is hypoplastic or absent. Anatomic variants: As described IMPRESSION: Intracranial atherosclerotic disease with multifocal stenoses, most notably as follows. Progressive severe stenosis within the distal M1 right middle cerebral artery. Redemonstrated multifocal severe stenoses within the A2 and A3 right anterior cerebral artery. Flow related enhancement is poorly appreciated within the right anterior cerebral artery distal to this. On the prior CTA of 10/10/2020, this portion of the right anterior cerebral artery demonstrated only minimal  thready enhancement. New severe stenosis versus artifact within the A3/A4 left anterior cerebral artery. The non dominant intracranial left vertebral artery is irregular and poorly delineated beyond the left PICA origin. This is at least partially related to developmentally small vessel size. There may be superimposed high-grade stenosis. 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 Electronically Signed   By: Jackey LogeKyle  Golden DO   On: 05/25/2021 17:49   MR ANGIO NECK WO CONTRAST  Result Date: 05/25/2021 EXAM: MRA NECK WITHOUT  CONTRAST TECHNIQUE: Multiplanar and multiecho pulse sequences of the neck were obtained without intravenous contrast. Angiographic images of the neck were obtained using MRA technique without and with intravenous contrast. COMPARISON:  CT angiogram neck 10/10/2020. FINDINGS: Examination significantly limited by moderate to severe motion degradation and noncontrast technique. The common carotid and internal carotid arteries are patent within the neck. Within described limitations, no definite hemodynamically significant stenosis of the common carotid or internal carotid arteries within the neck is identified. The vertebral arteries are patent within the neck with antegrade flow bilaterally. Motion degradation significantly limits evaluation for stenoses within these vessels. The right vertebral artery is dominant. IMPRESSION: Examination significantly limited by moderate to severe motion degradation and non-contrast technique. The common carotid and internal carotid arteries are patent within the neck. Within described limitations, no definite hemodynamically significant stenosis of the common carotid or internal carotid arteries is identified within the neck. The vertebral arteries are patent within the neck with antegrade flow bilaterally. Motion degradation significantly limits evaluation for stenoses within these vessels. Electronically Signed   By: Jackey LogeKyle  Golden DO   On: 05/25/2021 18:09   MR BRAIN WO CONTRAST  Result Date: 05/25/2021 CLINICAL DATA:  Neuro deficit, acute, stroke suspected. Additional history provided: Patient reports worsening right-sided weakness and aphasia. EXAM: MRI HEAD WITHOUT CONTRAST TECHNIQUE: Multiplanar, multiecho pulse sequences of the brain and surrounding structures were obtained without intravenous contrast. COMPARISON:  Noncontrast head CT performed earlier today 05/25/2021. Brain MRI 01/11/2021. CT angiogram head/neck 10/10/2020. FINDINGS: The patient was unable to tolerate  the full examination. As a result, only axial and coronal diffusion-weighted sequences (and the corresponding ADC sequences) could be obtained. There is moderate motion degradation of the coronal diffusion-weighted sequence. 2.0 x 0.8 x 1.8 cm focus of restricted diffusion within the right corona radiata compatible with acute infarction. No acute infarct is identified elsewhere within the brain. IMPRESSION: The patient was unable to tolerate the full examination. As a result, only diffusion-weighted imaging could be obtained. The acquired coronal diffusion-weighted sequence is moderately motion degraded. 2.0 x 0.8 x 1.8 cm acute infarct within the right corona radiata. No acute infarct identified elsewhere within the brain. Electronically Signed   By: Jackey LogeKyle  Golden DO   On: 05/25/2021 17:33   DG Chest Portable 1 View  Result Date: 05/25/2021 CLINICAL DATA:  Shortness of breath. EXAM: PORTABLE CHEST 1 VIEW COMPARISON:  None. FINDINGS: Left chest wall port a catheter noted with tip in the projection of the SVC. Normal heart size. No pleural effusion or interstitial edema. No airspace consolidation. Scoliosis deformity identified within the thoracic spine, convex towards the right. IMPRESSION: No acute cardiopulmonary abnormalities. Electronically Signed   By: Signa Kellaylor  Stroud M.D.   On: 05/25/2021 18:42   CT HEAD CODE STROKE WO CONTRAST  Result Date: 05/25/2021 CLINICAL DATA:  Code stroke.  Comment EXAM: CT HEAD WITHOUT CONTRAST TECHNIQUE: Contiguous axial images were obtained from the base of the skull through the vertex without intravenous  contrast. COMPARISON:  None. FINDINGS: Brain: No evidence of acute large vascular territory infarction, hemorrhage, hydrocephalus, extra-axial collection or mass lesion/mass effect. 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 Additional remote infarcts in the right MCA territory. Advanced chronic microvascular  ischemic disease. Moderate atrophy with ex vacuo ventricular dilation. Vascular: No hyperdense vessel identified. Skull: No acute fracture. Sinuses/Orbits: Clear sinuses.  Unremarkable orbits. Other: No mastoid effusions. ASPECTS [facility] Stroke Program Early CT Score) total score (0-10 with 10 being normal): 10. IMPRESSION: 1. No evidence of acute large vascular territory infarct or acute hemorrhage. ASPECTS is 10. 2. 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 Code stroke imaging results were communicated on 05/25/2021 at 2:38 pm to provider Dr. Pearlean Brownie via 2:37 p.m. Electronically Signed   By: Feliberto Harts MD   On: 05/25/2021 14:41    EKG: Independently reviewed.  NSR with rate 74; nonspecific ST changes with no evidence of acute ischemia   Labs on Admission: I have personally reviewed the available labs and imaging studies at the time of the admission.  Pertinent labs:  Covid negative  CTH: no acute infarct and MRI brain poorly tolerated with limited views but no acute infarct.     Assessment/Plan Principal Problem: Right hemiparesis and exacerbation of aphasia  MRI patient could not really tolerate.  -MRA with possible new stenosis and could be cause of symptoms.  Neurology following and recommended routine EEG follow up on above findings.  -echo, carotid dopplers ordered -infectious work up with UA/culture, blood cultures pending -a1c/lipid panel pending -tele -pt/ot/st eval -continue plavix/asa  CVA (cerebrovascular accident) (HCC) -hx of past multiple CVAs -continue plavix/ASA  -continued her keppra  -tele -modify risk factors.     Essential hypertension -continue home medication tomorrow. Allow for permissive HTN first 24 hours.    Dyslipidemia -continue home meds if passes swallow screen -lipid panel pending      Body mass index is 27.44 kg/m.    Level of care: Telemetry Medical DVT prophylaxis:  Lovenox  Code Status:  Full  - confirmed with patient/ Family Communication: son at bedside: ryan Hiebert  Disposition Plan:  The patient is from: SNF   Anticipated d/c is to: SNF s/p stroke work up  [facility] called: Neurology in ER-Dr. Pearlean Brownie   Admission status:  inpatient    Orland Mustard MD [facility]   How to contact the [facility] Attending or Consulting provider 7A - 7P or covering provider during after hours 7P -7A, for this patient?  Check the care team in [facility] and look for a) attending/consulting [facility] provider listed and b) the [facility] team listed Log into [email or web address] and use [place]'s universal password to access. If you do not have the password, please contact the hospital operator. Locate the [facility] provider you are looking for under [facility] and page to a number that you can be directly reached. If you still have difficulty reaching the provider, please page the [facility] (Director on Call) for the Hospitalists listed on amion for assistance.   05/25/2021, 7:23 PM

## 2021-05-25 NOTE — ED Notes (Signed)
Patient transported to MRI 

## 2021-05-25 NOTE — Consult Note (Signed)
Neurology Consultation  Reason for Consult: Acute worsening of residual stroke deficits: R facial droop, R weakness, and aphasia- Code stroke Referring Physician: Dr. Dalene Seltzer  CC: Acute worsening aphasia, right-sided weakness, and right facial droop   History is obtained from: EMS, Chart Review, Patient  HPI: Brandi Stewart is a 70 y.o. 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 Dysphagia, essential hypertension, hyperlipidemia, and malignant neoplasm of her left breast s/p mastectomy who presented to the ED today from her assisted living facility for evaluation of acute worsening of her aphasia and right-sided weakness. Staff reported to EMS that she was at her baseline functional status at 10:00 this morning and was later found by staff to have a pronounced right-sided facial droop, right hemiparesis, and aphasia with drooling from the right mouth that was significantly worse than her baseline deficits. EMS states that her aphasia and dysarthria on arrival to the facility had made her speech unintelligible and that her speech and mouth droop had improved prior to arrival at [facility]. She was brought in as a Code Stroke for further neurology evaluation.   Per chart review, Brandi Stewart has been seen at [facility] for multiple previous strokes. She was seen in November 2021 with an acute left thalamocapsular junction and left basal ganglia infarct and imaging at that time showed a chronic right MCA territory infarct. She was noted at that time to have right sided weakness from a previous stroke and remains on clopidogrel and aspirin daily for stroke prophylaxis. EEGs were obtained during November 2021 hospitalization without evidence of seizures or epileptiform discharges but due to the fact that seizures could not be ruled out, she was placed on Keppra for seizure prophylaxis. She is mainly wheelchair bound at baseline per outpatient neurology note in  February 2022, despite patient stating she walks at her facility without assistive device.   LKW: 10:00 tpa given?: no, outside of time window IR Thrombectomy? No, MRS of 4, presentation most consistent with recrudescence of stroke with acute worsening of residual deficits.  Modified Rankin Scale: 4-Needs assistance to walk and tend to bodily needs  ROS: Unable to obtain a complete ROS due to aphasia.   Past Medical History Multiple strokes with residual right-sided weakness and aphasia Dysphagia Essential Hypertension Hyperlipidemia Malignant breast cancer L breast s/p mastectomy  No family history on file.  Social History:   has no history on file for tobacco use, alcohol use, and drug use.  Medications No current facility-administered medications for this encounter. No current outpatient medications on file.  Exam: Current vital signs: BP (!) 150/86   Pulse 67   Temp 97.7 F (36.5 C) (Tympanic)   Resp 14   Ht 5\' 6"  (1.676 m)   Wt 77.1 kg   SpO2 99%   BMI 27.44 kg/m  Vital signs in last 24 hours:   GENERAL: Awake, alert, in no acute distress Psych: Affect appropriate for situation, patient is calm and cooperative with examination Head: Normocephalic and atraumatic EENT: No OP obstruction, dry mucous membranes, normal conjunctivae LUNGS: Normal respiratory effort. Non-labored breathing. SpO2 99% on room air. CV: Regular rate on telemetry, extremities well perfused ABDOMEN - Soft, non-tender, non-distended Ext: warm, without obvious deformity  NEURO:  Mental Status: Awake, alert, and oriented to self. She is unable to correctly state the month and incorrectly states that she is 70 years old.  Speech has moderate expressive aphasia and at times speech is unintelligible with dysarthria.  She  is able to comprehend simple midline and one-step commands well Naming remains intact, fluency impaired by aphasia, and comprehension intact. No neglect is noted.  Cranial  Nerves:  II: PERRL 3 mm/brisk. Visual fields full.  III, IV, VI: EOMI without ptosis V: Sensation is intact to light touch and symmetrical to face.  VII: Face is asymmetric resting and smiling with mild right mouth droop.   VIII: Hearing is intact to voice IX, X: Palate elevation is symmetric. Hypophonic with minimal speech.  XI: Shoulder shrug intact with left elevation greater than right.  XII: Tongue protrudes midline without fasciculations.   Motor: Right upper and lower extremity weakness noted; right upper extremity with no effort against gravity, right lower extremity with minimal effort against gravity. Left upper extremity with 4/5 strength and antigravity movement and vertical drift on assessment. Left lower extremity able to elevate against gravity without vertical drift on assessment.  Increased tone present on the right without contracture, bulk is normal.  Sustained clonus is noted at the right ankle.  Tone is increased on the right compared to the left Sensation: Sensation to light touch intact on bilateral upper and lower extremities but with decreased sensation reported to the right upper and lower extremity.  Coordination: Unable to assess on the right due to diffuse weakness.  DTRs: 3+ bilateral patellae, continuous clonus of the right ankle with dorsiflexion. 2+ and symmetric biceps.  Platar: Downgoing on the right, mute on the left Gait: Deferred  NIHSS: 1a Level of Conscious.: 0 1b LOC Questions: 2 1c LOC Commands: 0 2 Best Gaze: 0 3 Visual: 0 4 Facial Palsy: 1 5a Motor Arm - left: 1 5b Motor Arm - Right: 3 6a Motor Leg - Left: 0 6b Motor Leg - Right: 2 7 Limb Ataxia: 0 8 Sensory: 1 9 Best Language: 2 10 Dysarthria: 1 11 Extinct. and Inatten.: 0 TOTAL: 13   Labs I have reviewed labs in epic and the results pertinent to this consultation are: CBC    Component Value Date/Time   WBC 6.6 05/25/2021 1414   RBC 4.53 05/25/2021 1414   HGB 14.6 05/25/2021 1425    HCT 43.0 05/25/2021 1425   PLT 280 05/25/2021 1414   MCV 93.6 05/25/2021 1414   MCH 28.0 05/25/2021 1414   MCHC 30.0 05/25/2021 1414   RDW 14.4 05/25/2021 1414   LYMPHSABS 2.4 05/25/2021 1414   MONOABS 0.5 05/25/2021 1414   EOSABS 0.1 05/25/2021 1414   BASOSABS 0.0 05/25/2021 1414   CMP     Component Value Date/Time   NA 144 05/25/2021 1425   K 4.0 05/25/2021 1425   CL 108 05/25/2021 1425   CO2 24 05/25/2021 1414   GLUCOSE 117 (H) 05/25/2021 1425   BUN 11 05/25/2021 1425   CREATININE 0.70 05/25/2021 1425   CALCIUM 9.5 05/25/2021 1414   PROT 6.8 05/25/2021 1414   ALBUMIN 3.4 (L) 05/25/2021 1414   AST 22 05/25/2021 1414   ALT 20 05/25/2021 1414   ALKPHOS 89 05/25/2021 1414   BILITOT 0.3 05/25/2021 1414   GFRNONAA >60 05/25/2021 1414   Lipid Panel  No results found for: CHOL, TRIG, HDL, CHOLHDL, VLDL, LDLCALC, LDLDIRECT  No results found for: HGBA1C  Imaging I have reviewed the images obtained:  CT-scan of the brain 05/25/2021: 1. No evidence of acute large vascular territory infarct or acute hemorrhage. ASPECTS is 10. 2. 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  MRI / MRA examination of the brain pending  Assessment: 70 y.o. female with PMHx of multiple strokes with residual aphasia and right hemiparesis, seizures, breast cancer s/p mastectomy, HTN, and HLD who presented as a Code Stroke for acute worsening of right-sided weakness and aphasia at her nursing facility. - Examination revealed patient with severe expressive aphasia, dysarthria, and right-sided weakness.  - CT imaging completed without evidence of acute large vascular territory infarct or hemorrhage.  - tPA not given due to patient out of thrombolytic therapy time window. - Presentation felt to be most consistent with recrudescence of previous stroke symptoms. Etiology possibly toxic / metabolic / infectious derangements versus seizure with post-ictal state. Other DDx  includes new infarct in the same territory. MRI / MRA brain imaging pending for further evaluation. EEG pending to evaluate for possible seizure etiology of recrudescence.   Impression: Exacerbation of recent stroke deficits of aphasia and right hemiparesis etiology unclear Remote multifocal strokes Possible history of seizures Right hemiparesis from previous stroke Aphasia from previous stroke    Recommendations: - MRI brain and MRA head and neck - Routine EEG - Infectious work up: UA, chest x-ray, blood cultures  - HgbA1c, fasting lipid panel - Frequent neuro checks - Echocardiogram - Carotid dopplers - Prophylactic therapy- Antiplatelet med: Continue ASA and clopidogrel - Risk factor modification - Telemetry monitoring - PT consult, OT consult, Speech consult - Stroke team to follow  Lanae Boast, AGAC-NP [facility] Pager: [phone]) [phone]  I have personally obtained history,examined this patient, reviewed notes, independently viewed imaging studies, participated in medical decision making and plan of care.ROS completed by me personally and pertinent positives fully documented  I have made any additions or clarifications directly to the above note. Agree with note above.  Patient presented with sudden onset of worsening of baseline aphasia and right hemiparesis which may represent worsening of old deficits versus new stroke.  Patient is not a candidate for thrombolysis due to delay in arrival as thrombectomy given poor baseline functioning.  Admit to the medical team for further work-up with MRI scan MRAs, work-up for toxic metabolic infectious etiologies.  Discussed with Dr. Dalene Seltzer ER MD.  Delia Heady, MD Medical Director [facility] Pager: [phone] 05/25/2021 5:32 PM

## 2021-05-25 NOTE — Progress Notes (Signed)
Brandi Stewart GHW:[id number] DOB: 10-30-1951 DOA: 05/25/2021  PCP: Pcp, No Consultants:  None Patient coming from:  [facility]; NOK: [place], [place], [phone]  Chief Complaint: stroke symptoms  HPI: Brandi Stewart is a 70 y.o. female with medical history significant of HTN; HLD; and CVA in 10/2020 presenting with concern for recurrent CVA.      ED Course: CVA or recrudescence.  Seen by neurology as Code Stroke.  Not TPA candidate.  Very dysarthric with worsened R-sided deficits, ?improved during transport but not back to baseline.   I was paged to see this patient.  However, after I called back and went to see the patient, I was physically unable to see her because she was in MRI from 1550 until now.  I attempted to see her in MRI but she has been moved upstairs to 3W and they are still in the process of cleaning her room.  Admission will be deferred at this time.  Patient Placement RN is aware.      Jonah Blue MD [facility]   How to contact the [facility] Attending or Consulting provider 7A - 7P or covering provider during after hours 7P -7A, for this patient?  Check the care team in [facility] and look for a) attending/consulting [facility] provider listed and b) the [facility] team listed Log into [email or web address] and use [place]'s universal password to access. If you do not have the password, please contact the hospital operator. Locate the [facility] provider you are looking for under [facility] and page to a number that you can be directly reached. If you still have difficulty reaching the provider, please page the [facility] (Director on Call) for the Hospitalists listed on amion for assistance.   05/25/2021, 3:57 PM

## 2021-05-25 NOTE — ED Provider Notes (Signed)
[facility] Provider Note   CSN: [id number] Arrival date & time: 05/25/21  1434  An emergency department physician performed an initial assessment on this suspected stroke patient at 1410.  History Chief Complaint  Patient presents with   Code Stroke    Brandi LevyBrenton Stewart is a 70 y.o. female.  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  History limited by acuity of condition. NO known recent illness per son.  Per EMS, she was LNW at 10AM, at 1245 provider came in to assess and noticed complete right sided paralysis and garbled speech.  EMS reports her symptoms are improving en route. Pt reports she is still not at baseline.   Past Medical History:  Diagnosis Date   CVA (cerebral vascular accident) [facility](HCC)    Dyslipidemia    Essential hypertension     Patient Active Problem List   Diagnosis Date Noted   Essential hypertension 05/25/2021   Dyslipidemia 05/25/2021   Acute CVA (cerebrovascular accident) (HCC) 05/25/2021     OB History   No obstetric history on file.     History reviewed. No pertinent family history.  Social History   Tobacco Use   Smoking status: Never   Smokeless tobacco: Never  Substance Use Topics   Alcohol use: Not Currently   Drug use: Not Currently    Home Medications Prior to Admission medications   Medication Sig Start Date End Date Taking? Authorizing Provider  albuterol (VENTOLIN HFA) 108 (90 Base) MCG/ACT inhaler Inhale 2 puffs into the lungs 2 (two) times daily as needed for wheezing or shortness of breath. 10/26/20  Yes [provider]  atorvastatin (LIPITOR) 40 MG tablet Take 40 mg by mouth at bedtime. 05/17/21  Yes [provider]  clopidogrel (PLAVIX) 75 MG tablet Take 75 mg by  mouth daily. 05/17/21  Yes [provider]  hydrALAZINE (APRESOLINE) 25 MG tablet Take 25 mg by mouth See admin instructions. Take 25 mg by mouth every six hours AS NEEDED/AS DIRECTED 04/03/21  Yes [provider]  levETIRAcetam (KEPPRA) 500 MG tablet Take 500 mg by mouth 2 (two) times daily. 05/09/21  Yes [provider]  melatonin 3 MG TABS tablet Take 3 mg by mouth at bedtime.   Yes [provider]  mirtazapine (REMERON) 7.5 MG tablet Take 7.5 mg by mouth at bedtime. 05/12/21  Yes [provider]  [facility] powder Apply 1 application topically See admin instructions. Apply three times a day to affected areas for yeast 04/21/21  Yes [provider]  telmisartan (MICARDIS) 40 MG tablet Take 40 mg by mouth daily. 05/22/21  Yes [provider]    Allergies    Statins  Review of Systems   Review of Systems  Unable to perform ROS: Acuity of condition   Physical Exam Updated Vital Signs BP (!) 160/92 (BP Location: Left Arm)   Pulse 62   Temp 99.4 F (37.4 C) (Oral)   Resp 16   Ht 5\' 6"  (1.676 m)   Wt 77.1 kg   SpO2 100%   BMI 27.44 kg/m   Physical Exam Vitals and nursing note reviewed.  Constitutional:      General: She is not in acute distress.    Appearance: Normal appearance. She is not ill-appearing, toxic-appearing or diaphoretic.  HENT:  Head: Normocephalic.  Eyes:     Conjunctiva/sclera: Conjunctivae normal.  Cardiovascular:     Rate and Rhythm: Normal rate and regular rhythm.     Pulses: Normal pulses.  Pulmonary:     Effort: Pulmonary effort is normal. No respiratory distress.  Musculoskeletal:        General: No deformity or signs of injury.     Cervical back: No rigidity.  Skin:    General: Skin is warm and dry.     Coloration: Skin is not jaundiced or pale.  Neurological:     General: No focal deficit present.     Mental Status: She is alert.     Comments: Bilateral LE weakness, right side worse, right  ssided UE weakness Facial droop right      ED Results / Procedures / Treatments   Labs (all labs ordered are listed, but only abnormal results are displayed) Labs Reviewed  COMPREHENSIVE METABOLIC PANEL - Abnormal; Notable for the following components:      Result Value   Glucose, Bld 116 (*)    Albumin 3.4 (*)    All other components within normal limits  CBG MONITORING, ED - Abnormal; Notable for the following components:   Glucose-Capillary 121 (*)    All other components within normal limits  POCT I-STAT, CHEM 8 - Abnormal; Notable for the following components:   Glucose, Bld 117 (*)    All other components within normal limits  RESP PANEL BY RT-PCR (FLU A&B, COVID) ARPGX2  CULTURE, BLOOD (ROUTINE X 2)  CULTURE, BLOOD (ROUTINE X 2)  URINE CULTURE  ETHANOL  PROTIME-INR  APTT  CBC  DIFFERENTIAL  RAPID URINE DRUG SCREEN, HOSP PERFORMED  URINALYSIS, ROUTINE W REFLEX MICROSCOPIC  HEMOGLOBIN A1C  LIPID PANEL  I-STAT CHEM 8, ED    EKG None  Radiology MR ANGIO HEAD WO CONTRAST  Result Date: 05/25/2021 CLINICAL DATA:  Neuro deficit, acute, stroke suspected. Additional history provided: Worsening right-sided weakness and aphasia. EXAM: MRA HEAD WITHOUT CONTRAST TECHNIQUE: Angiographic images of the Circle of Willis were acquired using MRA technique without intravenous contrast. COMPARISON:  Brain MRI 01/11/2021. CT angiogram head/neck 10/10/2020. FINDINGS: Anterior circulation: The intracranial internal carotid arteries are patent. The M1 middle cerebral arteries are patent. Progressive severe stenosis within the distal M1 right middle cerebral artery. Atherosclerotic irregularity of the M2 and more distal middle cerebral arteries bilaterally. However, no M2 proximal branch occlusion or high-grade proximal M2 stenosis is identified. Redemonstrated multifocal severe stenoses within the A2 and A3 right anterior cerebral artery. Flow related enhancement is poorly appreciated within  the distal anterior cerebral artery. On the prior CTA head of 10/10/2020, only minimal thready enhancement of the distal right ACA was appreciated. The left anterior cerebral artery is patent. New severe stenosis versus artifact within the A3/A4 left anterior cerebral artery (series 307, image 2). Posterior circulation: The dominant intracranial right vertebral artery is patent without significant stenosis. The non dominant intracranial left vertebral artery is irregular and poorly delineated beyond the left PICA origin. This is at least partially related to developmentally small vessel size. There may be superimposed high-grade stenosis. The basilar artery is patent. The posterior cerebral arteries are patent. Redemonstrated high-grade stenosis within the P3 right posterior cerebral artery. Known high-grade stenosis within the P4 right PCA better appreciated on the prior CTA. The right posterior cerebral artery is fetal in origin. The left posterior communicating artery is hypoplastic or absent. Anatomic variants: As described IMPRESSION: Intracranial atherosclerotic disease with multifocal stenoses,  most notably as follows. Progressive severe stenosis within the distal M1 right middle cerebral artery. Redemonstrated multifocal severe stenoses within the A2 and A3 right anterior cerebral artery. Flow related enhancement is poorly appreciated within the right anterior cerebral artery distal to this. On the prior CTA of 10/10/2020, this portion of the right anterior cerebral artery demonstrated only minimal thready enhancement. New severe stenosis versus artifact within the A3/A4 left anterior cerebral artery. The non dominant intracranial left vertebral artery is irregular and poorly delineated beyond the left PICA origin. This is at least partially related to developmentally small vessel size. There may be superimposed high-grade stenosis. Known high-grade stenoses within the P3 and P4 right posterior cerebral  artery. Electronically Signed   By: Jackey Loge DO   On: 05/25/2021 17:49   MR ANGIO NECK WO CONTRAST  Result Date: 05/25/2021 EXAM: MRA NECK WITHOUT CONTRAST TECHNIQUE: Multiplanar and multiecho pulse sequences of the neck were obtained without intravenous contrast. Angiographic images of the neck were obtained using MRA technique without and with intravenous contrast. COMPARISON:  CT angiogram neck 10/10/2020. FINDINGS: Examination significantly limited by moderate to severe motion degradation and noncontrast technique. The common carotid and internal carotid arteries are patent within the neck. Within described limitations, no definite hemodynamically significant stenosis of the common carotid or internal carotid arteries within the neck is identified. The vertebral arteries are patent within the neck with antegrade flow bilaterally. Motion degradation significantly limits evaluation for stenoses within these vessels. The right vertebral artery is dominant. IMPRESSION: Examination significantly limited by moderate to severe motion degradation and non-contrast technique. The common carotid and internal carotid arteries are patent within the neck. Within described limitations, no definite hemodynamically significant stenosis of the common carotid or internal carotid arteries is identified within the neck. The vertebral arteries are patent within the neck with antegrade flow bilaterally. Motion degradation significantly limits evaluation for stenoses within these vessels. Electronically Signed   By: Jackey Loge DO   On: 05/25/2021 18:09   MR BRAIN WO CONTRAST  Result Date: 05/25/2021 CLINICAL DATA:  Neuro deficit, acute, stroke suspected. Additional history provided: Patient reports worsening right-sided weakness and aphasia. EXAM: MRI HEAD WITHOUT CONTRAST TECHNIQUE: Multiplanar, multiecho pulse sequences of the brain and surrounding structures were obtained without intravenous contrast. COMPARISON:   Noncontrast head CT performed earlier today 05/25/2021. Brain MRI 01/11/2021. CT angiogram head/neck 10/10/2020. FINDINGS: The patient was unable to tolerate the full examination. As a result, only axial and coronal diffusion-weighted sequences (and the corresponding ADC sequences) could be obtained. There is moderate motion degradation of the coronal diffusion-weighted sequence. 2.0 x 0.8 x 1.8 cm focus of restricted diffusion within the right corona radiata compatible with acute infarction. No acute infarct is identified elsewhere within the brain. IMPRESSION: The patient was unable to tolerate the full examination. As a result, only diffusion-weighted imaging could be obtained. The acquired coronal diffusion-weighted sequence is moderately motion degraded. 2.0 x 0.8 x 1.8 cm acute infarct within the right corona radiata. No acute infarct identified elsewhere within the brain. Electronically Signed   By: Jackey Loge DO   On: 05/25/2021 17:33   DG Chest Portable 1 View  Result Date: 05/25/2021 CLINICAL DATA:  Shortness of breath. EXAM: PORTABLE CHEST 1 VIEW COMPARISON:  None. FINDINGS: Left chest wall port a catheter noted with tip in the projection of the SVC. Normal heart size. No pleural effusion or interstitial edema. No airspace consolidation. Scoliosis deformity identified within the thoracic spine, convex towards the right.  IMPRESSION: No acute cardiopulmonary abnormalities. Electronically Signed   By: Signa Kell M.D.   On: 05/25/2021 18:42   CT HEAD CODE STROKE WO CONTRAST  Result Date: 05/25/2021 CLINICAL DATA:  Code stroke.  Comment EXAM: CT HEAD WITHOUT CONTRAST TECHNIQUE: Contiguous axial images were obtained from the base of the skull through the vertex without intravenous contrast. COMPARISON:  None. FINDINGS: Brain: No evidence of acute large vascular territory infarction, hemorrhage, hydrocephalus, extra-axial collection or mass lesion/mass effect. Remote lacunar infarcts involving  bilateral thalami and basal ganglia, cerebellum, pons, and corona radiata. Additional remote infarcts in the right MCA territory. Advanced chronic microvascular ischemic disease. Moderate atrophy with ex vacuo ventricular dilation. Vascular: No hyperdense vessel identified. Skull: No acute fracture. Sinuses/Orbits: Clear sinuses.  Unremarkable orbits. Other: No mastoid effusions. ASPECTS [facility] Stroke Program Early CT Score) total score (0-10 with 10 being normal): 10. IMPRESSION: 1. No evidence of acute large vascular territory infarct or acute hemorrhage. ASPECTS is 10. 2. 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 Code stroke imaging results were communicated on 05/25/2021 at 2:38 pm to provider Dr. Pearlean Brownie via 2:37 p.m. Electronically Signed   By: Feliberto Harts MD   On: 05/25/2021 14:41    Procedures Procedures   Medications Ordered in ED Medications  acetaminophen (TYLENOL) tablet 650 mg (has no administration in time range)    Or  acetaminophen (TYLENOL) 160 MG/5ML solution 650 mg (has no administration in time range)    Or  acetaminophen (TYLENOL) suppository 650 mg (has no administration in time range)  enoxaparin (LOVENOX) injection 40 mg (40 mg Subcutaneous Given 05/25/21 2223)  senna-docusate (Senokot-S) tablet 1 tablet (has no administration in time range)  atorvastatin (LIPITOR) tablet 40 mg (40 mg Oral Given 05/25/21 2226)  mirtazapine (REMERON) tablet 7.5 mg (7.5 mg Oral Given 05/25/21 2226)  clopidogrel (PLAVIX) tablet 75 mg (has no administration in time range)  levETIRAcetam (KEPPRA) tablet 500 mg (500 mg Oral Given 05/25/21 2225)  irbesartan (AVAPRO) tablet 150 mg (has no administration in time range)  aspirin EC tablet 81 mg (has no administration in time range)   stroke: mapping our early stages of recovery book ( Does not apply Given 05/25/21 2227)    ED Course  I have reviewed the triage vital signs and the nursing notes.  Pertinent labs  & imaging results that were available during my care of the patient were reviewed by me and considered in my medical decision making (see chart for details).    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  DDx includes recrudesence of prior stroke, acute CVA, TIA, seizure. Not intervention canddiate, out of tPA window with improving symptoms on arrival. Plan on admission for further evaluation, MRI pending.     Final Clinical Impression(s) / ED Diagnoses Final diagnoses:  Cerebrovascular accident (CVA), unspecified mechanism [facility])    Rx / DC Orders ED Discharge Orders     None        Alvira Monday, MD 05/25/21 2341

## 2021-05-25 NOTE — ED Triage Notes (Signed)
Patient presents to the ED via [facility] for possible code stroke.  Patient was last seen normal at 10:00 this morning.  At 12:45pm the provider came in to assess the patient and noticed complete ride sided paralysis and garbled speech. Patient has had a previous stroke in November along with a seizure.    BP: 150/60 HR:70 CBG 104

## 2021-05-25 NOTE — Code Documentation (Addendum)
Stroke Response Nurse Documentation Code Documentation  Brandi Stewart is a 70 y.o. female arriving to [facility] ED via [facility] on 05/25/2021 with past medical hx of TIA, CVA, Dysphagia, HLP. Code stroke was activated by EMS. Patient from [facility] where she was LKW at 1000 and now complaining of worsneing right sided weakness and aphasia. Staff reported they found her at 1230 with right sided worsening and inability to communicate. At baseline, pt has right arm and leg weakness with some mild aphasia. On aspirin 325 mg daily and clopidogrel 75 mg daily.   Stroke team at the bedside on patient arrival. Labs drawn and patient cleared for CT by Dr. Dalene Seltzer. Patient to CT with team. NIHSS 15, see documentation for details and code stroke times. Patient with disoriented, right arm weakness, bilateral leg weakness, right decreased sensation, Expressive aphasia , and dysarthria  on exam. The following imaging was completed: CT, CTA head and neck. Patient is not a candidate for tPA due to  being outside window.   Care/Plan: q 2 MNIHSS/VS. Bedside handoff with ED RN Marisue Ivan.    Lucila Maine  Stroke Response RN

## 2021-05-26 ENCOUNTER — Other Ambulatory Visit (HOSPITAL_COMMUNITY): Payer: Medicare HMO

## 2021-05-26 ENCOUNTER — Inpatient Hospital Stay (HOSPITAL_COMMUNITY): Payer: Medicare HMO

## 2021-05-26 ENCOUNTER — Encounter (HOSPITAL_COMMUNITY): Payer: Medicare HMO

## 2021-05-26 DIAGNOSIS — I6389 Other cerebral infarction: Secondary | ICD-10-CM

## 2021-05-26 DIAGNOSIS — I10 Essential (primary) hypertension: Secondary | ICD-10-CM

## 2021-05-26 DIAGNOSIS — E785 Hyperlipidemia, unspecified: Secondary | ICD-10-CM

## 2021-05-26 LAB — BLOOD CULTURE ID PANEL (REFLEXED) - BCID2

## 2021-05-26 LAB — HEMOGLOBIN A1C
Hgb A1c MFr Bld: 6.2 % — ABNORMAL HIGH (ref 4.8–5.6)
Mean Plasma Glucose: 131.24 mg/dL

## 2021-05-26 LAB — URINALYSIS, ROUTINE W REFLEX MICROSCOPIC
Bilirubin Urine: NEGATIVE
Glucose, UA: NEGATIVE mg/dL
Hgb urine dipstick: NEGATIVE
Ketones, ur: 5 mg/dL — AB
Nitrite: POSITIVE — AB
Protein, ur: NEGATIVE mg/dL
Specific Gravity, Urine: 1.013 (ref 1.005–1.030)
pH: 7 (ref 5.0–8.0)

## 2021-05-26 LAB — RAPID URINE DRUG SCREEN, HOSP PERFORMED
Amphetamines: NOT DETECTED
Barbiturates: NOT DETECTED
Benzodiazepines: NOT DETECTED
Cocaine: NOT DETECTED
Opiates: NOT DETECTED
Tetrahydrocannabinol: NOT DETECTED

## 2021-05-26 LAB — ECHOCARDIOGRAM COMPLETE
Area-P 1/2: 2.19 cm2
Calc EF: 74.1 %
Height: 66 in
S' Lateral: 2.4 cm
Single Plane A2C EF: 71.6 %
Single Plane A4C EF: 75.3 %
Weight: 2720 oz

## 2021-05-26 MED ORDER — PERFLUTREN LIPID MICROSPHERE
1.0000 mL | INTRAVENOUS | Status: AC | PRN
  Administered 2021-05-26: 3 mL via INTRAVENOUS
  Filled 2021-05-26: qty 10

## 2021-05-26 NOTE — Procedures (Signed)
Patient Name: Brandi Stewart  MRN: [id number]  Epilepsy Attending: Charlsie Quest  Referring Physician/Provider: Lanae Boast, NP Date: 05/26/2021 Duration: 22.13 mins  Patient history: 70 year old female presented with right hemiparesis and worsening aphasia.  EEG to evaluate for seizures.  Level of alertness: Awake, asleep  AEDs during EEG study: LEV  Technical aspects: This EEG study was done with scalp electrodes positioned according to the 10-20 International system of electrode placement. Electrical activity was acquired at a sampling rate of 500Hz  and reviewed with a high frequency filter of 70Hz  and a low frequency filter of 1Hz . EEG data were recorded continuously and digitally stored.   Description: The posterior dominant rhythm consists of 8-9 Hz activity of moderate voltage (25-35 uV) seen predominantly in posterior head regions, symmetric and reactive to eye opening and eye closing. Sleep was characterized by vertex waves, sleep spindles (12 to 14 Hz), maximal frontocentral region. EEG showed intermittent generalized and maximal left frontotemporal region 3 to 6 Hz theta-delta slowing. Hyperventilation and photic stimulation were not performed.     ABNORMALITY - Intermittent slow, generalized and maximal left frontotemporal region  IMPRESSION: This study is suggestive of nonspecific cortical dysfunction in left frontotemporal region.  Additionally there is mild diffuse encephalopathy, nonspecific etiology.  No seizures or definite epileptiform discharges were seen throughout the recording.  Zorion Nims 

## 2021-05-26 NOTE — Progress Notes (Signed)
EEG complete - results pending 

## 2021-05-26 NOTE — Plan of Care (Signed)
  Problem: Education: Goal: Knowledge of disease or condition will improve Outcome: Progressing Goal: Knowledge of secondary prevention will improve Outcome: Progressing Goal: Knowledge of patient specific risk factors addressed and post discharge goals established will improve Outcome: Progressing Goal: Individualized Educational Video(s) Outcome: Progressing   Problem: Coping: Goal: Will verbalize positive feelings about self Outcome: Progressing Goal: Will identify appropriate support needs Outcome: Progressing   Problem: Ischemic Stroke/TIA Tissue Perfusion: Goal: Complications of ischemic stroke/TIA will be minimized Outcome: Progressing   

## 2021-05-26 NOTE — Progress Notes (Signed)
  Echocardiogram 2D Echocardiogram with definity has been performed.  Leta Jungling M 05/26/2021, 11:53 AM

## 2021-05-26 NOTE — NC FL2 (Signed)
[place] MEDICAID [id number] LEVEL OF CARE SCREENING TOOL     IDENTIFICATION  Patient Name: Brandi Stewart Birthdate: 08-28-1951 Sex: female Admission Date (Current Location): 05/25/2021  [facility] and [place] Number:  Producer, television/film/video and Address:  [facility] [place], [address] [place], [place] [address]      Provider Number: [id number]  Attending Physician Name and Address:  Marzetta Board*  Relative Name and Phone Number:  Sheika Coutts, [phone]    Current Level of Care: Hospital Recommended Level of Care: Skilled Nursing Facility Prior Approval Number:    Date Approved/Denied:   PASRR Number: [id number]  Discharge Plan: SNF    Current Diagnoses: Patient Active Problem List   Diagnosis Date Noted   Essential hypertension 05/25/2021   Dyslipidemia 05/25/2021   Acute CVA (cerebrovascular accident) (HCC) 05/25/2021    Orientation RESPIRATION BLADDER Height & Weight     Self  Normal Incontinent, External catheter Weight: 170 lb (77.1 kg) Height:  5\' 6"  (167.6 cm)  BEHAVIORAL SYMPTOMS/MOOD NEUROLOGICAL BOWEL NUTRITION STATUS      Incontinent Diet (See DC summary)  AMBULATORY STATUS COMMUNICATION OF NEEDS Skin   Extensive Assist Verbally Skin abrasions (Bilateral Leg Ecchymosis)                       Personal Care Assistance Level of Assistance  Bathing, Feeding, Dressing Bathing Assistance: Maximum assistance Feeding assistance: Limited assistance Dressing Assistance: Maximum assistance     Functional Limitations Info  Sight, Hearing, Speech Sight Info: Adequate Hearing Info: Adequate Speech Info: Adequate    SPECIAL CARE FACTORS FREQUENCY  PT (By licensed PT), OT (By licensed OT)     PT Frequency: 5x week OT Frequency: 5x week            Contractures Contractures Info: Not present    Additional Factors Info  Code Status, Allergies Code Status Info: Full Allergies Info: Statins           Current  Medications (05/26/2021):  This is the current hospital active medication list Current Facility-Administered Medications  Medication Dose Route Frequency Provider Last Rate Last Admin   acetaminophen (TYLENOL) tablet 650 mg  650 mg Oral Q4H PRN 05/28/2021, MD       Or   acetaminophen (TYLENOL) 160 MG/5ML solution 650 mg  650 mg Per Tube Q4H PRN Orland Mustard, MD       Or   acetaminophen (TYLENOL) suppository 650 mg  650 mg Rectal Q4H PRN Orland Mustard, MD       aspirin EC tablet 81 mg  81 mg Oral Daily Orland Mustard, MD   81 mg at 05/26/21 1104   atorvastatin (LIPITOR) tablet 40 mg  40 mg Oral QHS 05/28/21, MD   40 mg at 05/25/21 2226   clopidogrel (PLAVIX) tablet 75 mg  75 mg Oral Daily 2227, MD   75 mg at 05/26/21 1103   enoxaparin (LOVENOX) injection 40 mg  40 mg Subcutaneous Q24H 05/28/21, MD   40 mg at 05/25/21 2223   irbesartan (AVAPRO) tablet 150 mg  150 mg Oral Daily 2224, MD   150 mg at 05/26/21 1103   levETIRAcetam (KEPPRA) tablet 500 mg  500 mg Oral BID 05/28/21, MD   500 mg at 05/26/21 1103   mirtazapine (REMERON) tablet 7.5 mg  7.5 mg Oral QHS 05/28/21, MD   7.5 mg at 05/25/21 2226   perflutren lipid microspheres (DEFINITY) IV  suspension  1-10 mL Intravenous PRN Orland Mustard, MD   3 mL at 05/26/21 1040   senna-docusate (Senokot-S) tablet 1 tablet  1 tablet Oral QHS PRN Orland Mustard, MD         Discharge Medications: Please see discharge summary for a list of discharge medications.  Relevant Imaging Results:  Relevant Lab Results:   Additional Information SS# [id number]

## 2021-05-26 NOTE — Evaluation (Signed)
Physical Therapy Evaluation Patient Details Name: Brandi Stewart MRN: [id number] DOB: 1951/07/30 Today's Date: 05/26/2021   History of Present Illness  70 y/o female presented to ED on 6/23 with R sided facial droop, R hemiparesis and weakness, and aphasia. CT head (-) for acute infarct or hemorrhage. MRI limited due to patient tolerance but found acute infarct within R corona radiata. 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 Patient reports using w/c and RW for mobility. Patient presents with generalized weakness (R>L), impaired balance, decreased activity tolerance, impaired sensation, impaired cognition, and dysarthria/aphasia. Patient currently functioning at [facility] for bed mobility and transfers. Patient with no active movement noted in R LE this session. Patient following simple commands with increased time to process. Patient will benefit from skilled PT services during acute stay to address listed deficits. Recommend SNF following discharge to maximize functional mobility and safety.     Follow Up Recommendations SNF    Equipment Recommendations  None recommended by PT    Recommendations for Other Services       Precautions / Restrictions Precautions Precautions: Fall Precaution Comments: R hemi, aphasia/dysarthria at baseline Restrictions Weight Bearing Restrictions: No      Mobility  Bed Mobility Overal bed mobility: Needs Assistance Bed Mobility: Rolling;Supine to Sit;Sit to Supine Rolling: Min assist;Mod assist   Supine to sit: Max assist;+2 for physical assistance;+2 for safety/equipment Sit to supine: Max assist;+2 for physical assistance;+2 for safety/equipment   General bed mobility comments: minA to roll towards R, modA to roll towards L. MaxA+2 for advancing R LE on/off bed and trunk elevation. No movement noted in R LE    Transfers Overall transfer level:  Needs assistance Equipment used: 2 person hand held assist Transfers: Sit to/from Stand Sit to Stand: Total assist;Max assist;+2 physical assistance;+2 safety/equipment         General transfer comment: totalA+2 for initial sit to stand with no activation on R side. MaxA+2 for second attempt with activation on L side but none on R  Ambulation/Gait             General Gait Details: unable  Stairs            Wheelchair Mobility    Modified Rankin (Stroke Patients Only) Modified Rankin (Stroke Patients Only) Pre-Morbid Rankin Score: Moderately severe disability Modified Rankin: Severe disability     Balance Overall balance assessment: Needs assistance Sitting-balance support: Single extremity supported;Feet supported Sitting balance-Leahy Scale: Fair Sitting balance - Comments: close supervision for sitting EOB   Standing balance support: Bilateral upper extremity supported Standing balance-Leahy Scale: Zero Standing balance comment: max-totalA+2 to maintain standing with B UE support                             Pertinent Vitals/Pain Pain Assessment: Faces Faces Pain Scale: No hurt Pain Intervention(s): Monitored during session    Home Living Family/patient expects to be discharged to:: Skilled nursing facility                      Prior Function Level of Independence: Needs assistance   Gait / Transfers Assistance Needed: reports using w/c and RW  ADL's / Homemaking Assistance Needed: reports needing assistance for bathing at times        Hand Dominance        Extremity/Trunk Assessment  Upper Extremity Assessment Upper Extremity Assessment: Defer to OT evaluation    Lower Extremity Assessment Lower Extremity Assessment: RLE deficits/detail;LLE deficits/detail RLE Deficits / Details: 0/5, patient reports feeling light touch on R LE LLE Deficits / Details: grossly 2/5    Cervical / Trunk Assessment Cervical / Trunk  Assessment: Kyphotic  Communication   Communication: Expressive difficulties  Cognition Arousal/Alertness: Awake/alert Behavior During Therapy: WFL for tasks assessed/performed Overall Cognitive Status: Impaired/Different from baseline Area of Impairment: Orientation;Attention;Memory;Following commands;Awareness;Problem solving                 Orientation Level: Disoriented to;Place;Situation;Time Current Attention Level: Sustained Memory: Decreased short-term memory Following Commands: Follows one step commands consistently;Follows one step commands with increased time   Awareness: Emergent Problem Solving: Slow processing;Requires verbal cues General Comments: Following simple commands with increased time to process. Patient with emerging awareness with asking "how long?" when told to stand. A&Ox1.      General Comments      Exercises     Assessment/Plan    PT Assessment Patient needs continued PT services  PT Problem List Decreased strength;Decreased range of motion;Decreased balance;Decreased mobility;Decreased activity tolerance;Decreased coordination;Decreased safety awareness;Impaired sensation       PT Treatment Interventions DME instruction;Gait training;Functional mobility training;Therapeutic activities;Therapeutic exercise;Balance training;Neuromuscular re-education;Patient/family education;Wheelchair mobility training    PT Goals (Current goals can be found in the Care Plan section)  Acute Rehab PT Goals Patient Stated Goal: did not state PT Goal Formulation: With patient Time For Goal Achievement: 06/08/2021    Frequency Min 3X/week   Barriers to discharge        Co-evaluation PT/OT/SLP Co-Evaluation/Treatment: Yes Reason for Co-Treatment: For patient/therapist safety;To address functional/ADL transfers PT goals addressed during session: Mobility/safety with mobility;Balance         AM-PAC PT "6 Clicks" Mobility  Outcome Measure Help needed  turning from your back to your side while in a flat bed without using bedrails?: A Little Help needed moving from lying on your back to sitting on the side of a flat bed without using bedrails?: Total Help needed moving to and from a bed to a chair (including a wheelchair)?: Total Help needed standing up from a chair using your arms (e.g., wheelchair or bedside chair)?: Total Help needed to walk in hospital room?: Total Help needed climbing 3-5 steps with a railing? : Total 6 Click Score: 8    End of Session Equipment Utilized During Treatment: Gait belt Activity Tolerance: Patient tolerated treatment well Patient left: in bed;with call bell/phone within reach;with bed alarm set Nurse Communication: Mobility status PT Visit Diagnosis: Unsteadiness on feet (R26.81);Muscle weakness (generalized) (M62.81);Difficulty in walking, not elsewhere classified (R26.2);Other symptoms and signs involving the nervous system (R29.898);Hemiplegia and hemiparesis Hemiplegia - Right/Left: Right Hemiplegia - caused by: Cerebral infarction (chronic)    Time: 7943-2761 PT Time Calculation (min) (ACUTE ONLY): 30 min   Charges:   PT Evaluation $PT Eval Moderate Complexity: 1 Mod PT Treatments $Therapeutic Activity: 8-22 mins        Hisham Provence A. Dan Humphreys PT, DPT Acute Rehabilitation Services Pager [phone] Office [phone]   Viviann Spare 05/26/2021, 9:55 AM

## 2021-05-26 NOTE — Evaluation (Signed)
Occupational Therapy Evaluation Patient Details Name: Brandi Stewart MRN: [id number] DOB: 1951-06-15 Today's Date: 05/26/2021    History of Present Illness 70 y/o female presented to ED on 6/23 with R sided facial droop, R hemiparesis, R weakness, and aphasia. CT head (-) for acute infarct or hemorrhage. MRI limited due to patient tolerance but found acute infarct within R corona radiata. 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 Pt potentially a poor historian. Currently, pt performing UB ADL with mod A; LB ADL with max A and bed mobility with Min A +2 for physical assistance. Pt performing sit<>stand transfer with total-Max A + 2 for safety and verbal cues for sequencing transfer. Pt presenting with decreased cognition, strength, verbal expression, and balance. Recommend continued OT at SNF and will follow acutely to optimize independence in ADLs.     Follow Up Recommendations  SNF    Equipment Recommendations  None recommended by OT    Recommendations for Other Services Speech consult     Precautions / Restrictions Precautions Precautions: Fall Precaution Comments: R hemi, aphasia/dysarthria at baseline Restrictions Weight Bearing Restrictions: No      Mobility Bed Mobility Overal bed mobility: Needs Assistance Bed Mobility: Rolling;Supine to Sit;Sit to Supine Rolling: Min assist;Mod assist   Supine to sit: Max assist;+2 for physical assistance;+2 for safety/equipment Sit to supine: Max assist;+2 for physical assistance;+2 for safety/equipment   General bed mobility comments: minA to roll towards R, modA to roll towards L. MaxA+2 for advancing R LE on/off bed and trunk elevation. No active movement noted in RLE or RUE    Transfers Overall transfer level: Needs assistance Equipment used: 2 person hand held  assist Transfers: Sit to/from Stand Sit to Stand: Total assist;Max assist;+2 physical assistance;+2 safety/equipment         General transfer comment: totalA+2 for initial sit to stand with no activation on R side. MaxA+2 for second attempt with activation on L side but none on R    Balance Overall balance assessment: Needs assistance Sitting-balance support: Single extremity supported;Feet supported Sitting balance-Leahy Scale: Fair Sitting balance - Comments: Min Guard A for sitting EOB   Standing balance support: Bilateral upper extremity supported Standing balance-Leahy Scale: Zero Standing balance comment: max-totalA+2 to maintain standing with B UE support                           ADL either performed or assessed with clinical judgement   ADL Overall ADL's : Needs assistance/impaired Eating/Feeding: Sitting;Moderate assistance Eating/Feeding Details (indicate cue type and reason): Mod A due to poor bilateral coordination Grooming: Wash/dry face;Min guard;Sitting Grooming Details (indicate cue type and reason): Pt perfroming face washing with min guard for safety. Upper Body Bathing: Moderate assistance;Bed level   Lower Body Bathing: Maximal assistance;Bed level;+2 for physical assistance;+2 for safety/equipment   Upper Body Dressing : Moderate assistance;Sitting   Lower Body Dressing: Bed level;Maximal assistance;+2 for safety/equipment;+2 for physical assistance Lower Body Dressing Details (indicate cue type and reason): pt unable to don socks sitting EOB. Pt attempting, but cannot safely reach feet at this time. Toilet Transfer: Total assistance;Squat-pivot;BSC;+2 for physical assistance;+2 for safety/equipment;Cueing for sequencing   Toileting- Clothing Manipulation and Hygiene: Bed level;Maximal assistance;+2 for physical assistance;+2 for safety/equipment       Functional mobility during ADLs: Total assistance;Maximal  assistance;+2 for physical  assistance;+2 for safety/equipment (sit to stand transfer) General ADL Comments: Pt requiring max A for LB ADLs due to inability to reach feet. Pt unable to don socks; performing face washing sitting EOB with min guard A for safety     Vision   Additional Comments: attenting more to L side upon entry, but no apparent difficulty with vision. Looking to right to see therapist speaking.     Perception     Praxis      Pertinent Vitals/Pain Pain Assessment: Faces Faces Pain Scale: No hurt Pain Intervention(s): Monitored during session     Hand Dominance     Extremity/Trunk Assessment Upper Extremity Assessment Upper Extremity Assessment: Generalized weakness;RUE deficits/detail RUE Deficits / Details: Pt with slight movement of digits of RUE. Unable to perfrom flexion and extension of digits. Thumb in adduction. no active gross motor movement of RUE   Lower Extremity Assessment Lower Extremity Assessment: Defer to PT evaluation RLE Deficits / Details: 0/5, patient reports feeling light touch on R LE LLE Deficits / Details: grossly 2/5   Cervical / Trunk Assessment Cervical / Trunk Assessment: Kyphotic   Communication Communication Communication: Expressive difficulties   Cognition Arousal/Alertness: Awake/alert Behavior During Therapy: WFL for tasks assessed/performed Overall Cognitive Status: No family/caregiver present to determine baseline cognitive functioning Area of Impairment: Orientation;Attention;Memory;Following commands;Awareness;Problem solving;Safety/judgement                 Orientation Level: Disoriented to;Place;Situation;Time Current Attention Level: Sustained Memory: Decreased short-term memory Following Commands: Follows one step commands consistently;Follows one step commands with increased time Safety/Judgement: Decreased awareness of safety;Decreased awareness of deficits Awareness: Emergent Problem Solving: Slow processing;Requires verbal  cues;Difficulty sequencing General Comments: Following simple commands with increased time to process. Patient with emerging awareness with asking "how long?" when told to stand. Pt oriented to self but not place, situation, or time. Pt with improved altertness sitting EOB, attenting more to RUE. By end of session, pt using LUE to move RUE. Requiring verbal cues to sequence rolling in bed and standing.   General Comments  Pt with difficulty with verbal expression, but pleasant and willing to attempt verbalizations.    Exercises     Shoulder Instructions      Home Living Family/patient expects to be discharged to:: Skilled nursing facility                                        Prior Functioning/Environment Level of Independence: Needs assistance  Gait / Transfers Assistance Needed: reports using w/c and RW ADL's / Homemaking Assistance Needed: reports needing assistance for bathing at times   Comments: Unsure if patient is poor historian.        OT Problem List: Decreased strength;Decreased range of motion;Decreased activity tolerance;Impaired balance (sitting and/or standing);Decreased cognition;Decreased safety awareness      OT Treatment/Interventions: Self-care/ADL training;Therapeutic exercise;DME and/or AE instruction;Neuromuscular education;Therapeutic activities;Manual therapy;Cognitive remediation/compensation;Patient/family education    OT Goals(Current goals can be found in the care plan section) Acute Rehab OT Goals Patient Stated Goal: did not state OT Goal Formulation: With patient Time For Goal Achievement: 06/16/2021 Potential to Achieve Goals: Good  OT Frequency: Min 2X/week   Barriers to D/C:            Co-evaluation PT/OT/SLP Co-Evaluation/Treatment: Yes Reason for Co-Treatment: For patient/therapist safety;To address functional/ADL transfers PT goals addressed during session: Mobility/safety with mobility OT goals addressed  during  session: ADL's and self-care      AM-PAC OT "6 Clicks" Daily Activity     Outcome Measure Help from another person eating meals?: A Lot Help from another person taking care of personal grooming?: A Little Help from another person toileting, which includes using toliet, bedpan, or urinal?: A Lot Help from another person bathing (including washing, rinsing, drying)?: A Lot Help from another person to put on and taking off regular upper body clothing?: A Lot Help from another person to put on and taking off regular lower body clothing?: A Lot 6 Click Score: 13   End of Session Equipment Utilized During Treatment: Gait belt Nurse Communication: Mobility status  Activity Tolerance: Patient tolerated treatment well Patient left: in bed;with call bell/phone within reach;with bed alarm set  OT Visit Diagnosis: Unsteadiness on feet (R26.81);Muscle weakness (generalized) (M62.81);Hemiplegia and hemiparesis;Other symptoms and signs involving cognitive function Hemiplegia - Right/Left: Right Hemiplegia - caused by: Cerebral infarction                Time: 6301-6010 OT Time Calculation (min): 29 min Charges:  OT General Charges $OT Visit: 1 Visit OT Evaluation $OT Eval Moderate Complexity: 1 [address] OTDS   Ladene Artist 05/26/2021, 11:17 AM

## 2021-05-26 NOTE — Progress Notes (Signed)
PHARMACY - PHYSICIAN COMMUNICATION CRITICAL VALUE ALERT - BLOOD CULTURE IDENTIFICATION (BCID)  Brandi Stewart is an 69 y.o. female who presented to [facility] on 05/25/2021 with a chief complaint of CVA  Assessment:  1/3 BC with Strep species, no signs of infection, likely contaminant Name of physician (or Provider) Contacted: Kirke Shaggy  Current antibiotics:none  Changes to prescribed antibiotics recommended:  No changes  Results for orders placed or performed during the hospital encounter of 05/25/21  Blood Culture ID Panel (Reflexed) (Collected: 05/25/2021  8:57 PM)  Result Value Ref Range   Enterococcus faecalis NOT DETECTED NOT DETECTED   Enterococcus Faecium NOT DETECTED NOT DETECTED   Listeria monocytogenes NOT DETECTED NOT DETECTED   Staphylococcus species NOT DETECTED NOT DETECTED   Staphylococcus aureus (BCID) NOT DETECTED NOT DETECTED   Staphylococcus epidermidis NOT DETECTED NOT DETECTED   Staphylococcus lugdunensis NOT DETECTED NOT DETECTED   Streptococcus species DETECTED (A) NOT DETECTED   Streptococcus agalactiae NOT DETECTED NOT DETECTED   Streptococcus pneumoniae NOT DETECTED NOT DETECTED   Streptococcus pyogenes NOT DETECTED NOT DETECTED   A.calcoaceticus-baumannii NOT DETECTED NOT DETECTED   Bacteroides fragilis NOT DETECTED NOT DETECTED   Enterobacterales NOT DETECTED NOT DETECTED   Enterobacter cloacae complex NOT DETECTED NOT DETECTED   Escherichia coli NOT DETECTED NOT DETECTED   Klebsiella aerogenes NOT DETECTED NOT DETECTED   Klebsiella oxytoca NOT DETECTED NOT DETECTED   Klebsiella pneumoniae NOT DETECTED NOT DETECTED   Proteus species NOT DETECTED NOT DETECTED   Salmonella species NOT DETECTED NOT DETECTED   Serratia marcescens NOT DETECTED NOT DETECTED   Haemophilus influenzae NOT DETECTED NOT DETECTED   Neisseria meningitidis NOT DETECTED NOT DETECTED   Pseudomonas aeruginosa NOT DETECTED NOT DETECTED   Stenotrophomonas maltophilia NOT DETECTED NOT  DETECTED   Candida albicans NOT DETECTED NOT DETECTED   Candida auris NOT DETECTED NOT DETECTED   Candida glabrata NOT DETECTED NOT DETECTED   Candida krusei NOT DETECTED NOT DETECTED   Candida parapsilosis NOT DETECTED NOT DETECTED   Candida tropicalis NOT DETECTED NOT DETECTED   Cryptococcus neoformans/gattii NOT DETECTED NOT DETECTED    Talbert Cage Poteet 05/26/2021  11:12 PM

## 2021-05-26 NOTE — Progress Notes (Addendum)
STROKE TEAM PROGRESS NOTE   INTERVAL HISTORY The Echo tech is at [facility] doing Echo. The pt still has aphasia and right side weakness. Difficult to tell on exam what may be new given extensive underlying debility and prev strokes. EEG reviewed and did not show seizure focus. Mri shows new stroke in right corona radiata but there is no corresponding left-sided weakness and appears to be a silent stroke.  Vitals:   05/26/21 0000 05/26/21 0200 05/26/21 0400 05/26/21 0600  BP: (!) 146/83 (!) 159/73 (!) 152/87 (!) 148/74  Pulse: 85 76 73 67  Resp: 15 17 18 16   Temp: 97.9 F (36.6 C)  (!) 97.5 F (36.4 C)   TempSrc: Temporal  Temporal   SpO2: 99% 98% 100% 99%  Weight:      Height:       CBC:  Recent Labs  Lab 05/25/21 1414 05/25/21 1425  WBC 6.6  --   NEUTROABS 3.6  --   HGB 12.7 14.6  HCT 42.4 43.0  MCV 93.6  --   PLT 280  --    Basic Metabolic Panel:  Recent Labs  Lab 05/25/21 1414 05/25/21 1425  NA 143 144  K 4.1 4.0  CL 109 108  CO2 24  --   GLUCOSE 116* 117*  BUN 10 11  CREATININE 0.77 0.70  CALCIUM 9.5  --    Lipid Panel: No results for input(s): CHOL, TRIG, HDL, CHOLHDL, VLDL, LDLCALC in the last 168 hours. HgbA1c:  Recent Labs  Lab 05/25/21 1414  HGBA1C 6.2*   Urine Drug Screen: No results for input(s): LABOPIA, COCAINSCRNUR, LABBENZ, AMPHETMU, THCU, LABBARB in the last 168 hours.  Alcohol Level  Recent Labs  Lab 05/25/21 1414  ETH <10    IMAGING past 24 hours MR ANGIO HEAD WO CONTRAST  Result Date: 05/25/2021 CLINICAL DATA:  Neuro deficit, acute, stroke suspected. Additional history provided: Worsening right-sided weakness and aphasia. EXAM: MRA HEAD WITHOUT CONTRAST TECHNIQUE: Angiographic images of the Circle of Willis were acquired using MRA technique without intravenous contrast. COMPARISON:  Brain MRI 01/11/2021. CT angiogram head/neck 10/10/2020. FINDINGS: Anterior circulation: The intracranial internal carotid arteries are patent. The M1 middle  cerebral arteries are patent. Progressive severe stenosis within the distal M1 right middle cerebral artery. Atherosclerotic irregularity of the M2 and more distal middle cerebral arteries bilaterally. However, no M2 proximal branch occlusion or high-grade proximal M2 stenosis is identified. Redemonstrated multifocal severe stenoses within the A2 and A3 right anterior cerebral artery. Flow related enhancement is poorly appreciated within the distal anterior cerebral artery. On the prior CTA head of 10/10/2020, only minimal thready enhancement of the distal right ACA was appreciated. The left anterior cerebral artery is patent. New severe stenosis versus artifact within the A3/A4 left anterior cerebral artery (series 307, image 2). Posterior circulation: The dominant intracranial right vertebral artery is patent without significant stenosis. The non dominant intracranial left vertebral artery is irregular and poorly delineated beyond the left PICA origin. This is at least partially related to developmentally small vessel size. There may be superimposed high-grade stenosis. The basilar artery is patent. The posterior cerebral arteries are patent. Redemonstrated high-grade stenosis within the P3 right posterior cerebral artery. Known high-grade stenosis within the P4 right PCA better appreciated on the prior CTA. The right posterior cerebral artery is fetal in origin. The left posterior communicating artery is hypoplastic or absent. Anatomic variants: As described IMPRESSION: Intracranial atherosclerotic disease with multifocal stenoses, most notably as follows. Progressive severe stenosis within  the distal M1 right middle cerebral artery. Redemonstrated multifocal severe stenoses within the A2 and A3 right anterior cerebral artery. Flow related enhancement is poorly appreciated within the right anterior cerebral artery distal to this. On the prior CTA of 10/10/2020, this portion of the right anterior cerebral artery  demonstrated only minimal thready enhancement. New severe stenosis versus artifact within the A3/A4 left anterior cerebral artery. The non dominant intracranial left vertebral artery is irregular and poorly delineated beyond the left PICA origin. This is at least partially related to developmentally small vessel size. There may be superimposed high-grade stenosis. 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 Electronically Signed   By: Jackey Loge DO   On: 05/25/2021 17:49   MR ANGIO NECK WO CONTRAST  Result Date: 05/25/2021 EXAM: MRA NECK WITHOUT CONTRAST TECHNIQUE: Multiplanar and multiecho pulse sequences of the neck were obtained without intravenous contrast. Angiographic images of the neck were obtained using MRA technique without and with intravenous contrast. COMPARISON:  CT angiogram neck 10/10/2020. FINDINGS: Examination significantly limited by moderate to severe motion degradation and noncontrast technique. The common carotid and internal carotid arteries are patent within the neck. Within described limitations, no definite hemodynamically significant stenosis of the common carotid or internal carotid arteries within the neck is identified. The vertebral arteries are patent within the neck with antegrade flow bilaterally. Motion degradation significantly limits evaluation for stenoses within these vessels. The right vertebral artery is dominant. IMPRESSION: Examination significantly limited by moderate to severe motion degradation and non-contrast technique. The common carotid and internal carotid arteries are patent within the neck. Within described limitations, no definite hemodynamically significant stenosis of the common carotid or internal carotid arteries is identified within the neck. The vertebral arteries are patent within the neck with antegrade flow bilaterally. Motion degradation significantly limits evaluation for stenoses within these vessels.  Electronically Signed   By: Jackey Loge DO   On: 05/25/2021 18:09   MR BRAIN WO CONTRAST  Result Date: 05/25/2021 CLINICAL DATA:  Neuro deficit, acute, stroke suspected. Additional history provided: Patient reports worsening right-sided weakness and aphasia. EXAM: MRI HEAD WITHOUT CONTRAST TECHNIQUE: Multiplanar, multiecho pulse sequences of the brain and surrounding structures were obtained without intravenous contrast. COMPARISON:  Noncontrast head CT performed earlier today 05/25/2021. Brain MRI 01/11/2021. CT angiogram head/neck 10/10/2020. FINDINGS: The patient was unable to tolerate the full examination. As a result, only axial and coronal diffusion-weighted sequences (and the corresponding ADC sequences) could be obtained. There is moderate motion degradation of the coronal diffusion-weighted sequence. 2.0 x 0.8 x 1.8 cm focus of restricted diffusion within the right corona radiata compatible with acute infarction. No acute infarct is identified elsewhere within the brain. IMPRESSION: The patient was unable to tolerate the full examination. As a result, only diffusion-weighted imaging could be obtained. The acquired coronal diffusion-weighted sequence is moderately motion degraded. 2.0 x 0.8 x 1.8 cm acute infarct within the right corona radiata. No acute infarct identified elsewhere within the brain. Electronically Signed   By: Jackey Loge DO   On: 05/25/2021 17:33   DG Chest Portable 1 View  Result Date: 05/25/2021 CLINICAL DATA:  Shortness of breath. EXAM: PORTABLE CHEST 1 VIEW COMPARISON:  None. FINDINGS: Left chest wall port a catheter noted with tip in the projection of the SVC. Normal heart size. No pleural effusion or interstitial edema. No airspace consolidation. Scoliosis deformity identified within the thoracic spine, convex towards the right. IMPRESSION: No acute cardiopulmonary abnormalities. Electronically Signed  By: Signa Kell M.D.   On: 05/25/2021 18:42   EEG adult  Result  Date: 05/26/2021 Charlsie Quest, MD     05/26/2021  1:32 PM Patient Name: Tran Randle MRN: [id number] Epilepsy Attending: Charlsie Quest Referring Physician/Provider: Lanae Boast, NP Date: 05/26/2021 Duration: 22.13 mins Patient history: 70 year old female presented with right hemiparesis and worsening aphasia.  EEG to evaluate for seizures. Level of alertness: Awake, asleep AEDs during EEG study: LEV Technical aspects: This EEG study was done with scalp electrodes positioned according to the 10-20 International system of electrode placement. Electrical activity was acquired at a sampling rate of  and reviewed with a high frequency filter of  and a low frequency filter of . EEG data were recorded continuously and digitally stored. Description: The posterior dominant rhythm consists of 8-9 Hz activity of moderate voltage (25-35 uV) seen predominantly in posterior head regions, symmetric and reactive to eye opening and eye closing. Sleep was characterized by vertex waves, sleep spindles (12 to 14 Hz), maximal frontocentral region. EEG showed intermittent generalized and maximal left frontotemporal region 3 to 6 Hz theta-delta slowing. Hyperventilation and photic stimulation were not performed.   ABNORMALITY - Intermittent slow, generalized and maximal left frontotemporal region IMPRESSION: This study is suggestive of nonspecific cortical dysfunction in left frontotemporal region.  Additionally there is mild diffuse encephalopathy, nonspecific etiology.  No seizures or definite epileptiform discharges were seen throughout the recording. Charlsie Quest   ECHOCARDIOGRAM COMPLETE  Result Date: 05/26/2021    ECHOCARDIOGRAM REPORT   Patient Name:   LEYANI GARGUS Date of Exam: 05/26/2021 Medical Rec #:  [id number]     Height:       66.0 in Accession #:    [id number]    Weight:       170.0 lb Date of Birth:  03-31-1951     BSA:          1.866 m Patient Age:    69 years      BP:           148/74 mmHg  Patient Gender: F             HR:           67 bpm. Exam Location:  Inpatient Procedure: 2D Echo, Cardiac Doppler, Color Doppler and Intracardiac            Opacification Agent Indications:    Stroke  History:        Patient has no prior history of Echocardiogram examinations.                 Stroke; Risk Factors:Hypertension and Dyslipidemia. Past history                 of breast cancer/.  Sonographer:    Leta Jungling RDCS Referring Phys: [id number] ALLISON WOLFE IMPRESSIONS  1. Left ventricular ejection fraction, by estimation, is 55 to 60%. The left ventricle has normal function. The left ventricle has no regional wall motion abnormalities. There is mild left ventricular hypertrophy. Left ventricular diastolic parameters are consistent with Grade I diastolic dysfunction (impaired relaxation). Elevated left atrial pressure.  2. Right ventricular systolic function is normal. The right ventricular size is normal.  3. The mitral valve is normal in structure. Trivial mitral valve regurgitation. No evidence of mitral stenosis.  4. The aortic valve is calcified. Aortic valve regurgitation is not visualized. No aortic stenosis is present. FINDINGS  Left Ventricle: Left ventricular ejection fraction, by estimation, is  55 to 60%. The left ventricle has normal function. The left ventricle has no regional wall motion abnormalities. Definity contrast agent was given IV to delineate the left ventricular  endocardial borders. The left ventricular internal cavity size was normal in size. There is mild left ventricular hypertrophy. Left ventricular diastolic parameters are consistent with Grade I diastolic dysfunction (impaired relaxation). Elevated left atrial pressure. Right Ventricle: The right ventricular size is normal. Right ventricular systolic function is normal. Left Atrium: Left atrial size was normal in size. Right Atrium: Right atrial size was normal in size. Pericardium: There is no evidence of pericardial effusion.  Mitral Valve: The mitral valve is normal in structure. Mild mitral annular calcification. Trivial mitral valve regurgitation. No evidence of mitral valve stenosis. Tricuspid Valve: The tricuspid valve is normal in structure. Tricuspid valve regurgitation is trivial. No evidence of tricuspid stenosis. Aortic Valve: The aortic valve is calcified. Aortic valve regurgitation is not visualized. No aortic stenosis is present. Pulmonic Valve: The pulmonic valve was not well visualized. Pulmonic valve regurgitation is not visualized. No evidence of pulmonic stenosis. Aorta: The aortic root is normal in size and structure. Venous: The inferior vena cava was not well visualized. IAS/Shunts: The interatrial septum was not well visualized.  LEFT VENTRICLE PLAX 2D LVIDd:         3.30 cm     Diastology LVIDs:         2.40 cm     LV e' medial:    4.25 cm/s LV PW:         1.00 cm     LV E/e' medial:  14.8 LV IVS:        1.30 cm     LV e' lateral:   6.65 cm/s LVOT diam:     2.20 cm     LV E/e' lateral: 9.4 LV SV:         75 LV SV Index:   40 LVOT Area:     3.80 cm  LV Volumes (MOD) LV vol d, MOD A2C: 62.3 ml LV vol d, MOD A4C: 66.2 ml LV vol s, MOD A2C: 17.7 ml LV vol s, MOD A4C: 16.4 ml LV SV MOD A2C:     44.6 ml LV SV MOD A4C:     66.2 ml LV SV MOD BP:      49.2 ml RIGHT VENTRICLE RV S prime:     11.20 cm/s LEFT ATRIUM             Index       RIGHT ATRIUM          Index LA diam:        2.20 cm 1.18 cm/m  RA Area:     7.57 cm LA Vol (A2C):   29.6 ml 15.86 ml/m RA Volume:   10.90 ml 5.84 ml/m LA Vol (A4C):   30.0 ml 16.07 ml/m LA Biplane Vol: 31.8 ml 17.04 ml/m  AORTIC VALVE LVOT Vmax:   81.30 cm/s LVOT Vmean:  63.400 cm/s LVOT VTI:    0.196 m  AORTA Ao Root diam: 2.70 cm MITRAL VALVE               TRICUSPID VALVE MV Area (PHT): 2.19 cm    TR Peak grad:   16.8 mmHg MV Decel Time: 346 msec    TR Vmax:        205.00 cm/s MV E velocity: 62.80 cm/s MV A velocity: 87.30 cm/s  SHUNTS MV E/A ratio:  0.72  Systemic VTI:  0.20  m                            Systemic Diam: 2.20 cm Olga Millers MD Electronically signed by Olga Millers MD Signature Date/Time: 05/26/2021/12:28:32 PM    Final    CT HEAD CODE STROKE WO CONTRAST  Result Date: 05/25/2021 CLINICAL DATA:  Code stroke.  Comment EXAM: CT HEAD WITHOUT CONTRAST TECHNIQUE: Contiguous axial images were obtained from the base of the skull through the vertex without intravenous contrast. COMPARISON:  None. FINDINGS: Brain: No evidence of acute large vascular territory infarction, hemorrhage, hydrocephalus, extra-axial collection or mass lesion/mass effect. 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 Additional remote infarcts in the right MCA territory. Advanced chronic microvascular ischemic disease. Moderate atrophy with ex vacuo ventricular dilation. Vascular: No hyperdense vessel identified. Skull: No acute fracture. Sinuses/Orbits: Clear sinuses.  Unremarkable orbits. Other: No mastoid effusions. ASPECTS [facility] Stroke Program Early CT Score) total score (0-10 with 10 being normal): 10. IMPRESSION: 1. No evidence of acute large vascular territory infarct or acute hemorrhage. ASPECTS is 10. 2. 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 Code stroke imaging results were communicated on 05/25/2021 at 2:38 pm to provider Dr. Pearlean Brownie via 2:37 p.m. Electronically Signed   By: Feliberto Harts MD   On: 05/25/2021 14:41    PHYSICAL EXAM General: Frail elderly African-American lady. No acute distress. Eyes: No scleral injection Head: Normocephalic.  Cardiovascular: Normal rate and regular rhythm.  Respiratory: Effort normal and breath sounds normal to anterior ascultation GI: Soft.  No distension. There is no tenderness.  Skin: WDI  Neurological Examination Mental Status: Awake, alert, and oriented to self. She is unable to correctly state the month and incorrectly states that she  is 70 years old. Speech has moderate expressive aphasia and at times speech is unintelligible with moderate dysarthria.  She is able to comprehend simple midline and one-step commands well Naming remains intact, fluency impaired by aphasia, and comprehension intact. No neglect is noted.  Cranial Nerves: II: PERRL 3 mm/brisk. Visual fields full. III, IV, VI: EOMI without ptosis V: Sensation is intact to light touch and symmetrical to face. VII: Face is asymmetric resting and smiling with mild right mouth droop.   VIII: Hearing is intact to voice IX, X: Palate elevation is symmetric. Hypophonic with minimal speech.  XI: Shoulder shrug intact with left elevation greater than right.  XII: Tongue protrudes midline without fasciculations.   Motor: Right upper and lower extremity weakness noted; right upper extremity with no effort against gravity, right lower extremity with minimal effort against gravity. Left upper extremity with 4/5 strength and antigravity movement and vertical drift on assessment. Left lower extremity able to elevate against gravity without vertical drift on assessment. Increased tone present on the right without contracture, bulk is normal.  Sustained clonus is noted at the right ankle.  Tone is increased on the right compared to the left Sensation: Sensation to light touch intact on bilateral upper and lower extremities but with decreased sensation reported to the right upper and lower extremity.  Coordination: Unable to assess on the right due to diffuse weakness.  DTRs: 3+ bilateral patellae, continuous clonus of the right ankle with dorsiflexion. 2+ and symmetric biceps. Platar: Downgoing on the right, mute on the left Gait: Deferred  ASSESSMENT/PLAN Ms. Diyana Starrett is a 70 y.o. 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 tPA not given due to patient out of thrombolytic therapy time window. Presentation felt to be most consistent with recrudescence of previous stroke symptoms. Etiology possibly toxic / metabolic / infectious derangements versus seizure with post-ictal state. Other DDx includes new infarct in the same territory.   Stroke: Right CR (this does not corrlate with presenting symptoms of increased right side weakness/aphasia and thus most likely incidental finding.) She has both diffuse large and small vessel disease.  She has health history of multiple strokes in the past with residual aphasia and hemiparesis documented prior admissions at [facility] Code Stroke CT head No acute abnormality. Small vessel disease. Atrophy. ASPECTS 10.    MRI  Limited exam, DWI shows acute infarct within the right CR MRA  neck: Motion degradation significantly limits evaluation, no severe stenosis noted MRA head: motion degraded, intracranial atherosclerotic disease with multifocal stenoses.  2D Echo 50% EF, mild LVH. No LA dilation LDL No results found for requested labs within last 16109 hours. HgbA1c 6.2 VTE prophylaxis - lovenox    Diet   Diet Heart Room service appropriate? Yes; Fluid consistency: Thin   clopidogrel 75 mg daily prior to admission, now on aspirin 81 mg daily and clopidogrel 75 mg daily for 3 weeks, then back to just plavix daily as at home Therapy recommendations:  pending Disposition:  pending  Hypertension Home meds:  Apresoline, micardis Stable Permissive hypertension (OK if < 220/120) but gradually normalize in 5-7 days Long-term BP goal normotensive  Hyperlipidemia Home meds:  Lipitor , restart LDL No results found for requested labs within last 60454 hours., goal < 70 High intensity statin  Continue statin at discharge  Diabetes type II - no dx HgbA1c 6.2, goal < 7.0 CBGs Recent Labs    05/25/21 1413  GLUCAP  121*    SSI  Other Stroke Risk Factors Advanced Age >/= 26  Obesity, Body mass index is 27.44 kg/m., BMI >/= 30 associated with increased stroke risk, recommend weight loss, diet and exercise as appropriate  Hx stroke/TIA Family hx stroke   Other Active Problems Ddx- exacerbation of recent stroke deficits could be possible seizures- started on Keppra. EEG non specific and no active seizures, but given her extensive strokes, she is at risk for seizures.  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 Agree with note above.  Patient presented with subjective worsening of her baseline aphasia and right hemiparesis.  MRI shows no new left hemispheric infarct or any obvious toxic metabolic infectious etiology for exacerbation of old deficits..  Right brain subcortical infarct which appears to be clinically asymptomatic and unrelated to the presenting complaints.  EEG shows slowing of brain activity but no definite epileptiform features are noted.  Given prior history of multiple strokes she is at risk for stroke seizures and could have had an unwitnessed seizure with postictal state and hence recommend trial of Keppra.  Recommend aspirin and Plavix for 3 weeks followed by Plavix alone and aggressive risk factor modification.  Greater than 50% time during this 35-minute visit was spent in counseling and coordination of care in discussion with care team.  Discussed with Dr. Brown Human.  Stroke team will sign off.  Kindly call for questions  Delia Heady, MD Medical Director [facility] Pager:  [phone] 05/26/2021 3:53 PM  To contact Stroke Continuity provider, please refer to [email or web address]. After hours, contact General Neurology

## 2021-05-26 NOTE — TOC Initial Note (Signed)
Transition of Care [facility]) - Initial/Assessment Note    Patient Details  Name: Brandi Stewart MRN: [id number] Date of Birth: 04/20/1951  Transition of Care [facility]) CM/SW Contact:    Brandi Lenis, LCSW Phone Number: 05/26/2021, 4:13 PM  Clinical Narrative:      CSW spoke with patient's son to confirm plan to return to [facility] upon discharge. Noting per chart review that patient does not appear to be at baseline at this time, has deficits from current stroke. CSW requested authorization through [facility] for SNF upon return to [facility]. CSW confirmed that Brandi Stewart can accept patient back while Berkley Harvey is still pending. CSW to follow.            Expected Discharge Plan: Skilled Nursing Facility Barriers to Discharge: Continued Medical Work up, English as a second language teacher   Patient Goals and CMS Choice Patient states their goals for this hospitalization and ongoing recovery are:: patient unable to participate in goal setting, not fully oriented CMS Medicare.gov Compare Post Acute Care list provided to:: Patient Represenative (must comment) Choice offered to / list presented to : Adult Children  Expected Discharge Plan and Services Expected Discharge Plan: Skilled Nursing Facility     Post Acute Care Choice: Skilled Nursing Facility Living arrangements for the past 2 months: Skilled Nursing Facility                                      Prior Living Arrangements/Services Living arrangements for the past 2 months: Skilled Nursing Facility Lives with:: Facility Resident Patient language and need for interpreter reviewed:: No Do you feel safe going back to the place where you live?: Yes      Need for Family Participation in Patient Care: Yes (Comment) Care giver support system in place?: Yes (comment)   Criminal Activity/Legal Involvement Pertinent to Current Situation/Hospitalization: No - Comment as needed  Activities of Daily Living      Permission Sought/Granted Permission  sought to share information with : Facility Medical sales representative, Family Supports Permission granted to share information with : Yes, Verbal Permission Granted  Share Information with NAME: Brandi Stewart  Permission granted to share info w AGENCY: [facility]  Permission granted to share info w Relationship: Son     Emotional Assessment   Attitude/Demeanor/Rapport: Unable to Assess Affect (typically observed): Unable to Assess Orientation: : Oriented to Self Alcohol / Substance Use: Not Applicable Psych Involvement: No (comment)  Admission diagnosis:  Acute [facility]cerebrovascular accident) [facility] [I63.9] Patient Active Problem List   Diagnosis Date Noted   Essential hypertension 05/25/2021   Dyslipidemia 05/25/2021   Acute CVA (cerebrovascular accident) (HCC) 05/25/2021   PCP:  Brandi Stewart, No Pharmacy:   Brandi Stewart, [facility] - [address] [place] [address] [place] [address] [place] [address] Phone: [phone] Fax: [phone]     Social Determinants of Health (SDOH) Interventions    Readmission Risk Interventions No flowsheet data found.

## 2021-05-26 NOTE — Progress Notes (Signed)
PROGRESS NOTE    Brandi Stewart  PJK:[id number] DOB: [birth year] DOA: 05/25/2021 PCP: Pcp, No   Brief Narrative:   Brandi Stewart is a 70 y.o. 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 She also felt dizzy. EMS was called. Son is here to help with history.  He states his moms symptoms have improved but not back to baseline. She is right handed. History difficult from patient due to aphasia/dysarthria.    Son states that with stroke In [place] 2021 possibly had seizure and was put on medication. Per notes seizure could not be ruled out and keppra was started. EEG wtihout evidence of seizure. Seen at [facility] for previous strokes.    ED Course: code stroke intiated. Bp: 127/105, HR: 66, afebrile, RR: 16, oxygen: 98% on room air. No other pertinent labs. CTH with no acute infarct. Outside of tpa window. Neurology consulted. Asked to admit for stroke rule out  and management of medical conditions.   Assessment & Plan:   Principal Problem:   Acute CVA (cerebrovascular accident) (HCC) Active Problems:   Essential hypertension   Dyslipidemia   Right hemiparesis and exacerbation of aphasia MRI limited but consistent with subcortical infarct Discussed with neuro, rec'd continuing current outpt anticoag regimen -echo reviewed ef 55 grade 1 ddys,  -a1c 6.2 -lipid pending -tele -pt/ot/st eval with recs fro SNF-Pt came from SNF-discussed with SW -continue plavix/asa   CVA (cerebrovascular accident) (HCC) -hx of past multiple CVAs -continue plavix/ASA -continued her keppra -tele -modify risk factors.       Essential hypertension -continue home medication tomorrow. Allow for permissive HTN first 24 hours.  -GRADUALLY TITRATE HOME MEDS BACK IN 148/74 -    Dyslipidemia -continue  home meds, LIPITOR 40 DAILY -lipid panel pending        Body mass index is 27.44 kg/m.  DVT prophylaxis: Lovenox SQ  Code Status: full    Code Status Orders  (From admission, onward)           Start     Ordered   05/25/21 1848  Full code  Continuous        05/25/21 1847           Code Status History     This patient has a current code status but no historical code status.      Family Communication:CALLED SION Disposition Plan:   Likely return to snf tomorrow Consults called: None Admission status: Inpatient   Consultants:  neuro  Procedures:  MR ANGIO HEAD WO CONTRAST  Result Date: 05/25/2021 CLINICAL DATA:  Neuro deficit, acute, stroke suspected. Additional history provided: Worsening right-sided weakness and aphasia. EXAM: MRA HEAD WITHOUT CONTRAST TECHNIQUE: Angiographic images of the Circle of Willis were acquired using MRA technique without intravenous contrast. COMPARISON:  Brain MRI 01/11/2021. CT angiogram head/neck 10/10/2020. FINDINGS: Anterior circulation: The intracranial internal carotid arteries are patent. The M1 middle cerebral arteries are patent. Progressive severe stenosis within the distal M1 right middle cerebral artery. Atherosclerotic irregularity of the M2 and more distal middle cerebral arteries bilaterally. However, no M2 proximal branch occlusion or high-grade proximal M2 stenosis is identified. Redemonstrated multifocal severe stenoses within the A2 and A3 right anterior cerebral artery. Flow related enhancement is poorly appreciated within the distal anterior cerebral artery. On the prior CTA head  of 10/10/2020, only minimal thready enhancement of the distal right ACA was appreciated. The left anterior cerebral artery is patent. New severe stenosis versus artifact within the A3/A4 left anterior cerebral artery (series 307, image 2). Posterior circulation: The dominant intracranial right vertebral artery is patent without significant stenosis.  The non dominant intracranial left vertebral artery is irregular and poorly delineated beyond the left PICA origin. This is at least partially related to developmentally small vessel size. There may be superimposed high-grade stenosis. The basilar artery is patent. The posterior cerebral arteries are patent. Redemonstrated high-grade stenosis within the P3 right posterior cerebral artery. Known high-grade stenosis within the P4 right PCA better appreciated on the prior CTA. The right posterior cerebral artery is fetal in origin. The left posterior communicating artery is hypoplastic or absent. Anatomic variants: As described IMPRESSION: Intracranial atherosclerotic disease with multifocal stenoses, most notably as follows. Progressive severe stenosis within the distal M1 right middle cerebral artery. Redemonstrated multifocal severe stenoses within the A2 and A3 right anterior cerebral artery. Flow related enhancement is poorly appreciated within the right anterior cerebral artery distal to this. On the prior CTA of 10/10/2020, this portion of the right anterior cerebral artery demonstrated only minimal thready enhancement. New severe stenosis versus artifact within the A3/A4 left anterior cerebral artery. The non dominant intracranial left vertebral artery is irregular and poorly delineated beyond the left PICA origin. This is at least partially related to developmentally small vessel size. There may be superimposed high-grade stenosis. 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 Electronically Signed   By: Jackey Loge DO   On: 05/25/2021 17:49   MR ANGIO NECK WO CONTRAST  Result Date: 05/25/2021 EXAM: MRA NECK WITHOUT CONTRAST TECHNIQUE: Multiplanar and multiecho pulse sequences of the neck were obtained without intravenous contrast. Angiographic images of the neck were obtained using MRA technique without and with intravenous contrast. COMPARISON:  CT angiogram neck  10/10/2020. FINDINGS: Examination significantly limited by moderate to severe motion degradation and noncontrast technique. The common carotid and internal carotid arteries are patent within the neck. Within described limitations, no definite hemodynamically significant stenosis of the common carotid or internal carotid arteries within the neck is identified. The vertebral arteries are patent within the neck with antegrade flow bilaterally. Motion degradation significantly limits evaluation for stenoses within these vessels. The right vertebral artery is dominant. IMPRESSION: Examination significantly limited by moderate to severe motion degradation and non-contrast technique. The common carotid and internal carotid arteries are patent within the neck. Within described limitations, no definite hemodynamically significant stenosis of the common carotid or internal carotid arteries is identified within the neck. The vertebral arteries are patent within the neck with antegrade flow bilaterally. Motion degradation significantly limits evaluation for stenoses within these vessels. Electronically Signed   By: Jackey Loge DO   On: 05/25/2021 18:09   MR BRAIN WO CONTRAST  Result Date: 05/25/2021 CLINICAL DATA:  Neuro deficit, acute, stroke suspected. Additional history provided: Patient reports worsening right-sided weakness and aphasia. EXAM: MRI HEAD WITHOUT CONTRAST TECHNIQUE: Multiplanar, multiecho pulse sequences of the brain and surrounding structures were obtained without intravenous contrast. COMPARISON:  Noncontrast head CT performed earlier today 05/25/2021. Brain MRI 01/11/2021. CT angiogram head/neck 10/10/2020. FINDINGS: The patient was unable to tolerate the full examination. As a result, only axial and coronal diffusion-weighted sequences (and the corresponding ADC sequences) could be obtained. There is moderate motion degradation of the coronal diffusion-weighted sequence. 2.0 x 0.8 x 1.8 cm focus of  restricted diffusion within the right corona radiata compatible with acute infarction. No acute infarct is identified elsewhere within the brain. IMPRESSION: The patient was unable to tolerate the full examination. As a result, only diffusion-weighted imaging could be obtained. The acquired coronal diffusion-weighted sequence is moderately motion degraded. 2.0 x 0.8 x 1.8 cm acute infarct within the right corona radiata. No acute infarct identified elsewhere within the brain. Electronically Signed   By: Jackey Loge DO   On: 05/25/2021 17:33   DG Chest Portable 1 View  Result Date: 05/25/2021 CLINICAL DATA:  Shortness of breath. EXAM: PORTABLE CHEST 1 VIEW COMPARISON:  None. FINDINGS: Left chest wall port a catheter noted with tip in the projection of the SVC. Normal heart size. No pleural effusion or interstitial edema. No airspace consolidation. Scoliosis deformity identified within the thoracic spine, convex towards the right. IMPRESSION: No acute cardiopulmonary abnormalities. Electronically Signed   By: Signa Kell M.D.   On: 05/25/2021 18:42   EEG adult  Result Date: 05/26/2021 Charlsie Quest, MD     05/26/2021  1:32 PM Patient Name: Anea Fodera MRN: [id number] Epilepsy Attending: Charlsie Quest Referring Physician/Provider: Lanae Boast, NP Date: 05/26/2021 Duration: 22.13 mins Patient history: 70 year old female presented with right hemiparesis and worsening aphasia.  EEG to evaluate for seizures. Level of alertness: Awake, asleep AEDs during EEG study: LEV Technical aspects: This EEG study was done with scalp electrodes positioned according to the 10-20 International system of electrode placement. Electrical activity was acquired at a sampling rate of  and reviewed with a high frequency filter of  and a low frequency filter of . EEG data were recorded continuously and digitally stored. Description: The posterior dominant rhythm consists of 8-9 Hz activity of moderate voltage  (25-35 uV) seen predominantly in posterior head regions, symmetric and reactive to eye opening and eye closing. Sleep was characterized by vertex waves, sleep spindles (12 to 14 Hz), maximal frontocentral region. EEG showed intermittent generalized and maximal left frontotemporal region 3 to 6 Hz theta-delta slowing. Hyperventilation and photic stimulation were not performed.   ABNORMALITY - Intermittent slow, generalized and maximal left frontotemporal region IMPRESSION: This study is suggestive of nonspecific cortical dysfunction in left frontotemporal region.  Additionally there is mild diffuse encephalopathy, nonspecific etiology.  No seizures or definite epileptiform discharges were seen throughout the recording. Charlsie Quest   ECHOCARDIOGRAM COMPLETE  Result Date: 05/26/2021    ECHOCARDIOGRAM REPORT   Patient Name:   ELLISYN ICENHOWER Date of Exam: 05/26/2021 Medical Rec #:  [id number]     Height:       66.0 in Accession #:    [id number]    Weight:       170.0 lb Date of Birth:  [birth year]     BSA:          1.866 m Patient Age:    69 years      BP:           148/74 mmHg Patient Gender: F             HR:           67 bpm. Exam Location:  Inpatient Procedure: 2D Echo, Cardiac Doppler, Color Doppler and Intracardiac            Opacification Agent Indications:    Stroke  History:        Patient has no prior history of Echocardiogram examinations.  Stroke; Risk Factors:Hypertension and Dyslipidemia. Past history                 of breast cancer/.  Sonographer:    Leta Jungling RDCS Referring Phys: [id number] ALLISON WOLFE IMPRESSIONS  1. Left ventricular ejection fraction, by estimation, is 55 to 60%. The left ventricle has normal function. The left ventricle has no regional wall motion abnormalities. There is mild left ventricular hypertrophy. Left ventricular diastolic parameters are consistent with Grade I diastolic dysfunction (impaired relaxation). Elevated left atrial pressure.  2. Right  ventricular systolic function is normal. The right ventricular size is normal.  3. The mitral valve is normal in structure. Trivial mitral valve regurgitation. No evidence of mitral stenosis.  4. The aortic valve is calcified. Aortic valve regurgitation is not visualized. No aortic stenosis is present. FINDINGS  Left Ventricle: Left ventricular ejection fraction, by estimation, is 55 to 60%. The left ventricle has normal function. The left ventricle has no regional wall motion abnormalities. Definity contrast agent was given IV to delineate the left ventricular  endocardial borders. The left ventricular internal cavity size was normal in size. There is mild left ventricular hypertrophy. Left ventricular diastolic parameters are consistent with Grade I diastolic dysfunction (impaired relaxation). Elevated left atrial pressure. Right Ventricle: The right ventricular size is normal. Right ventricular systolic function is normal. Left Atrium: Left atrial size was normal in size. Right Atrium: Right atrial size was normal in size. Pericardium: There is no evidence of pericardial effusion. Mitral Valve: The mitral valve is normal in structure. Mild mitral annular calcification. Trivial mitral valve regurgitation. No evidence of mitral valve stenosis. Tricuspid Valve: The tricuspid valve is normal in structure. Tricuspid valve regurgitation is trivial. No evidence of tricuspid stenosis. Aortic Valve: The aortic valve is calcified. Aortic valve regurgitation is not visualized. No aortic stenosis is present. Pulmonic Valve: The pulmonic valve was not well visualized. Pulmonic valve regurgitation is not visualized. No evidence of pulmonic stenosis. Aorta: The aortic root is normal in size and structure. Venous: The inferior vena cava was not well visualized. IAS/Shunts: The interatrial septum was not well visualized.  LEFT VENTRICLE PLAX 2D LVIDd:         3.30 cm     Diastology LVIDs:         2.40 cm     LV e' medial:    4.25  cm/s LV PW:         1.00 cm     LV E/e' medial:  14.8 LV IVS:        1.30 cm     LV e' lateral:   6.65 cm/s LVOT diam:     2.20 cm     LV E/e' lateral: 9.4 LV SV:         75 LV SV Index:   40 LVOT Area:     3.80 cm  LV Volumes (MOD) LV vol d, MOD A2C: 62.3 ml LV vol d, MOD A4C: 66.2 ml LV vol s, MOD A2C: 17.7 ml LV vol s, MOD A4C: 16.4 ml LV SV MOD A2C:     44.6 ml LV SV MOD A4C:     66.2 ml LV SV MOD BP:      49.2 ml RIGHT VENTRICLE RV S prime:     11.20 cm/s LEFT ATRIUM             Index       RIGHT ATRIUM          Index  LA diam:        2.20 cm 1.18 cm/m  RA Area:     7.57 cm LA Vol (A2C):   29.6 ml 15.86 ml/m RA Volume:   10.90 ml 5.84 ml/m LA Vol (A4C):   30.0 ml 16.07 ml/m LA Biplane Vol: 31.8 ml 17.04 ml/m  AORTIC VALVE LVOT Vmax:   81.30 cm/s LVOT Vmean:  63.400 cm/s LVOT VTI:    0.196 m  AORTA Ao Root diam: 2.70 cm MITRAL VALVE               TRICUSPID VALVE MV Area (PHT): 2.19 cm    TR Peak grad:   16.8 mmHg MV Decel Time: 346 msec    TR Vmax:        205.00 cm/s MV E velocity: 62.80 cm/s MV A velocity: 87.30 cm/s  SHUNTS MV E/A ratio:  0.72        Systemic VTI:  0.20 m                            Systemic Diam: 2.20 cm Olga Millers MD Electronically signed by Olga Millers MD Signature Date/Time: 05/26/2021/12:28:32 PM    Final    CT HEAD CODE STROKE WO CONTRAST  Result Date: 05/25/2021 CLINICAL DATA:  Code stroke.  Comment EXAM: CT HEAD WITHOUT CONTRAST TECHNIQUE: Contiguous axial images were obtained from the base of the skull through the vertex without intravenous contrast. COMPARISON:  None. FINDINGS: Brain: No evidence of acute large vascular territory infarction, hemorrhage, hydrocephalus, extra-axial collection or mass lesion/mass effect. 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 Additional remote infarcts in the right MCA territory. Advanced chronic microvascular ischemic disease. Moderate atrophy with ex vacuo  ventricular dilation. Vascular: No hyperdense vessel identified. Skull: No acute fracture. Sinuses/Orbits: Clear sinuses.  Unremarkable orbits. Other: No mastoid effusions. ASPECTS [facility] Stroke Program Early CT Score) total score (0-10 with 10 being normal): 10. IMPRESSION: 1. No evidence of acute large vascular territory infarct or acute hemorrhage. ASPECTS is 10. 2. 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 Code stroke imaging results were communicated on 05/25/2021 at 2:38 pm to provider Dr. Pearlean Brownie via 2:37 p.m. Electronically Signed   By: Feliberto Harts MD   On: 05/25/2021 14:41       Subjective: New admit, slurred speech, weaker RUE  Objective: Vitals:   05/26/21 0000 05/26/21 0200 05/26/21 0400 05/26/21 0600  BP: (!) 146/83 (!) 159/73 (!) 152/87 (!) 148/74  Pulse: 85 76 73 67  Resp: 15 17 18 16   Temp: 97.9 F (36.6 C)  (!) 97.5 F (36.4 C)   TempSrc: Temporal  Temporal   SpO2: 99% 98% 100% 99%  Weight:      Height:       No intake or output data in the 24 hours ending 05/26/21 1533 Filed Weights   05/25/21 1505  Weight: 77.1 kg    Examination:  General exam: Appears calm and comfortable  Respiratory system: Clear to auscultation. Respiratory effort normal. Cardiovascular system: S1 & S2 heard, RRR. No JVD, murmurs, rubs, gallops or clicks. No pedal edema. Gastrointestinal system: Abdomen is nondistended, soft and nontender. No organomegaly or masses felt. Normal bowel sounds heard. Central nervous system: Alert and oriented. No focal neurological deficits. Extremities: RUE 4/5, WORD FINDING DIFFUCLTY Skin: No rashes, lesions or ulcers Psychiatry: Judgement and insight appear normal. Mood & affect appropriate.  Data Reviewed: I have personally reviewed following labs and imaging studies  CBC: Recent Labs  Lab 05/25/21 1414 05/25/21 1425  WBC 6.6  --   NEUTROABS 3.6  --   HGB 12.7 14.6  HCT 42.4 43.0  MCV 93.6  --   PLT  280  --    Basic Metabolic Panel: Recent Labs  Lab 05/25/21 1414 05/25/21 1425  NA 143 144  K 4.1 4.0  CL 109 108  CO2 24  --   GLUCOSE 116* 117*  BUN 10 11  CREATININE 0.77 0.70  CALCIUM 9.5  --    GFR: Estimated Creatinine Clearance: 67.6 mL/min (by C-G formula based on SCr of 0.7 mg/dL). Liver Function Tests: Recent Labs  Lab 05/25/21 1414  AST 22  ALT 20  ALKPHOS 89  BILITOT 0.3  PROT 6.8  ALBUMIN 3.4*   No results for input(s): LIPASE, AMYLASE in the last 168 hours. No results for input(s): AMMONIA in the last 168 hours. Coagulation Profile: Recent Labs  Lab 05/25/21 1414  INR 1.1   Cardiac Enzymes: No results for input(s): CKTOTAL, CKMB, CKMBINDEX, TROPONINI in the last 168 hours. BNP (last 3 results) No results for input(s): PROBNP in the last 8760 hours. HbA1C: Recent Labs    05/25/21 1414  HGBA1C 6.2*   CBG: Recent Labs  Lab 05/25/21 1413  GLUCAP 121*   Lipid Profile: No results for input(s): CHOL, HDL, LDLCALC, TRIG, CHOLHDL, LDLDIRECT in the last 72 hours. Thyroid Function Tests: No results for input(s): TSH, T4TOTAL, FREET4, T3FREE, THYROIDAB in the last 72 hours. Anemia Panel: No results for input(s): VITAMINB12, FOLATE, FERRITIN, TIBC, IRON, RETICCTPCT in the last 72 hours. Sepsis Labs: No results for input(s): PROCALCITON, LATICACIDVEN in the last 168 hours.  Recent Results (from the past 240 hour(s))  Resp Panel by RT-PCR (Flu A&B, Covid) Nasopharyngeal Swab     Status: None   Collection Time: 05/25/21  3:19 PM   Specimen: Nasopharyngeal Swab; Nasopharyngeal(NP) swabs in vial transport medium  Result Value Ref Range Status   SARS Coronavirus 2 by RT PCR NEGATIVE NEGATIVE Final    Comment: (NOTE) SARS-CoV-2 target nucleic acids are NOT DETECTED.  The SARS-CoV-2 RNA is generally detectable in upper respiratory specimens during the acute phase of infection. The lowest concentration of SARS-CoV-2 viral copies this assay can detect  is 138 copies/mL. A negative result does not preclude SARS-Cov-2 infection and should not be used as the sole basis for treatment or other patient management decisions. A negative result may occur with  improper specimen collection/handling, submission of specimen other than nasopharyngeal swab, presence of viral mutation(s) within the areas targeted by this assay, and inadequate number of viral copies(<138 copies/mL). A negative result must be combined with clinical observations, patient history, and epidemiological information. The expected result is Negative.  Fact Sheet for Patients:  BloggerCourse.comhttps://www.fda.gov/media/152166/download  Fact Sheet for Healthcare Providers:  SeriousBroker.ithttps://www.fda.gov/media/152162/download  This test is no t yet approved or cleared by the [place] FDA and  has been authorized for detection and/or diagnosis of SARS-CoV-2 by FDA under an Emergency Use Authorization (EUA). This EUA will remain  in effect (meaning this test can be used) for the duration of the COVID-19 declaration under Section 564(b)(1) of the Act, 21 U.S.C.section 360bbb-3(b)(1), unless the authorization is terminated  or revoked sooner.       Influenza A by PCR NEGATIVE NEGATIVE Final   Influenza B by PCR NEGATIVE NEGATIVE Final    Comment: (NOTE) The Xpert Xpress  SARS-CoV-2/FLU/RSV plus assay is intended as an aid in the diagnosis of influenza from Nasopharyngeal swab specimens and should not be used as a sole basis for treatment. Nasal washings and aspirates are unacceptable for Xpert Xpress SARS-CoV-2/FLU/RSV testing.  Fact Sheet for Patients: BloggerCourse.com  Fact Sheet for Healthcare Providers: SeriousBroker.it  This test is not yet approved or cleared by the [place] FDA and has been authorized for detection and/or diagnosis of SARS-CoV-2 by FDA under an Emergency Use Authorization (EUA). This EUA will remain in effect  (meaning this test can be used) for the duration of the COVID-19 declaration under Section 564(b)(1) of the Act, 21 U.S.C. section 360bbb-3(b)(1), unless the authorization is terminated or revoked.  Performed at [facility], [address], [place], [place] [address]          Radiology Studies: MR ANGIO HEAD WO CONTRAST  Result Date: 05/25/2021 CLINICAL DATA:  Neuro deficit, acute, stroke suspected. Additional history provided: Worsening right-sided weakness and aphasia. EXAM: MRA HEAD WITHOUT CONTRAST TECHNIQUE: Angiographic images of the Circle of Willis were acquired using MRA technique without intravenous contrast. COMPARISON:  Brain MRI 01/11/2021. CT angiogram head/neck 10/10/2020. FINDINGS: Anterior circulation: The intracranial internal carotid arteries are patent. The M1 middle cerebral arteries are patent. Progressive severe stenosis within the distal M1 right middle cerebral artery. Atherosclerotic irregularity of the M2 and more distal middle cerebral arteries bilaterally. However, no M2 proximal branch occlusion or high-grade proximal M2 stenosis is identified. Redemonstrated multifocal severe stenoses within the A2 and A3 right anterior cerebral artery. Flow related enhancement is poorly appreciated within the distal anterior cerebral artery. On the prior CTA head of 10/10/2020, only minimal thready enhancement of the distal right ACA was appreciated. The left anterior cerebral artery is patent. New severe stenosis versus artifact within the A3/A4 left anterior cerebral artery (series 307, image 2). Posterior circulation: The dominant intracranial right vertebral artery is patent without significant stenosis. The non dominant intracranial left vertebral artery is irregular and poorly delineated beyond the left PICA origin. This is at least partially related to developmentally small vessel size. There may be superimposed high-grade stenosis. The basilar artery is patent. The  posterior cerebral arteries are patent. Redemonstrated high-grade stenosis within the P3 right posterior cerebral artery. Known high-grade stenosis within the P4 right PCA better appreciated on the prior CTA. The right posterior cerebral artery is fetal in origin. The left posterior communicating artery is hypoplastic or absent. Anatomic variants: As described IMPRESSION: Intracranial atherosclerotic disease with multifocal stenoses, most notably as follows. Progressive severe stenosis within the distal M1 right middle cerebral artery. Redemonstrated multifocal severe stenoses within the A2 and A3 right anterior cerebral artery. Flow related enhancement is poorly appreciated within the right anterior cerebral artery distal to this. On the prior CTA of 10/10/2020, this portion of the right anterior cerebral artery demonstrated only minimal thready enhancement. New severe stenosis versus artifact within the A3/A4 left anterior cerebral artery. The non dominant intracranial left vertebral artery is irregular and poorly delineated beyond the left PICA origin. This is at least partially related to developmentally small vessel size. There may be superimposed high-grade stenosis. 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 Electronically Signed   By: Jackey Loge DO   On: 05/25/2021 17:49   MR ANGIO NECK WO CONTRAST  Result Date: 05/25/2021 EXAM: MRA NECK WITHOUT CONTRAST TECHNIQUE: Multiplanar and multiecho pulse sequences of the neck were obtained without intravenous contrast. Angiographic images of the  neck were obtained using MRA technique without and with intravenous contrast. COMPARISON:  CT angiogram neck 10/10/2020. FINDINGS: Examination significantly limited by moderate to severe motion degradation and noncontrast technique. The common carotid and internal carotid arteries are patent within the neck. Within described limitations, no definite hemodynamically significant  stenosis of the common carotid or internal carotid arteries within the neck is identified. The vertebral arteries are patent within the neck with antegrade flow bilaterally. Motion degradation significantly limits evaluation for stenoses within these vessels. The right vertebral artery is dominant. IMPRESSION: Examination significantly limited by moderate to severe motion degradation and non-contrast technique. The common carotid and internal carotid arteries are patent within the neck. Within described limitations, no definite hemodynamically significant stenosis of the common carotid or internal carotid arteries is identified within the neck. The vertebral arteries are patent within the neck with antegrade flow bilaterally. Motion degradation significantly limits evaluation for stenoses within these vessels. Electronically Signed   By: Jackey Loge DO   On: 05/25/2021 18:09   MR BRAIN WO CONTRAST  Result Date: 05/25/2021 CLINICAL DATA:  Neuro deficit, acute, stroke suspected. Additional history provided: Patient reports worsening right-sided weakness and aphasia. EXAM: MRI HEAD WITHOUT CONTRAST TECHNIQUE: Multiplanar, multiecho pulse sequences of the brain and surrounding structures were obtained without intravenous contrast. COMPARISON:  Noncontrast head CT performed earlier today 05/25/2021. Brain MRI 01/11/2021. CT angiogram head/neck 10/10/2020. FINDINGS: The patient was unable to tolerate the full examination. As a result, only axial and coronal diffusion-weighted sequences (and the corresponding ADC sequences) could be obtained. There is moderate motion degradation of the coronal diffusion-weighted sequence. 2.0 x 0.8 x 1.8 cm focus of restricted diffusion within the right corona radiata compatible with acute infarction. No acute infarct is identified elsewhere within the brain. IMPRESSION: The patient was unable to tolerate the full examination. As a result, only diffusion-weighted imaging could be  obtained. The acquired coronal diffusion-weighted sequence is moderately motion degraded. 2.0 x 0.8 x 1.8 cm acute infarct within the right corona radiata. No acute infarct identified elsewhere within the brain. Electronically Signed   By: Jackey Loge DO   On: 05/25/2021 17:33   DG Chest Portable 1 View  Result Date: 05/25/2021 CLINICAL DATA:  Shortness of breath. EXAM: PORTABLE CHEST 1 VIEW COMPARISON:  None. FINDINGS: Left chest wall port a catheter noted with tip in the projection of the SVC. Normal heart size. No pleural effusion or interstitial edema. No airspace consolidation. Scoliosis deformity identified within the thoracic spine, convex towards the right. IMPRESSION: No acute cardiopulmonary abnormalities. Electronically Signed   By: Signa Kell M.D.   On: 05/25/2021 18:42   EEG adult  Result Date: 05/26/2021 Charlsie Quest, MD     05/26/2021  1:32 PM Patient Name: Brandi Stewart MRN: [id number] Epilepsy Attending: Charlsie Quest Referring Physician/Provider: Lanae Boast, NP Date: 05/26/2021 Duration: 22.13 mins Patient history: 70 year old female presented with right hemiparesis and worsening aphasia.  EEG to evaluate for seizures. Level of alertness: Awake, asleep AEDs during EEG study: LEV Technical aspects: This EEG study was done with scalp electrodes positioned according to the 10-20 International system of electrode placement. Electrical activity was acquired at a sampling rate of 500Hz  and reviewed with a high frequency filter of 70Hz  and a low frequency filter of 1Hz . EEG data were recorded continuously and digitally stored. Description: The posterior dominant rhythm consists of 8-9 Hz activity of moderate voltage (25-35 uV) seen predominantly in posterior head regions, symmetric and reactive to eye opening  and eye closing. Sleep was characterized by vertex waves, sleep spindles (12 to 14 Hz), maximal frontocentral region. EEG showed intermittent generalized and maximal left  frontotemporal region 3 to 6 Hz theta-delta slowing. Hyperventilation and photic stimulation were not performed.   ABNORMALITY - Intermittent slow, generalized and maximal left frontotemporal region IMPRESSION: This study is suggestive of nonspecific cortical dysfunction in left frontotemporal region.  Additionally there is mild diffuse encephalopathy, nonspecific etiology.  No seizures or definite epileptiform discharges were seen throughout the recording. Charlsie Quest   ECHOCARDIOGRAM COMPLETE  Result Date: 05/26/2021    ECHOCARDIOGRAM REPORT   Patient Name:   Brandi Stewart Date of Exam: 05/26/2021 Medical Rec #:  [id number]     Height:       66.0 in Accession #:    [id number]    Weight:       170.0 lb Date of Birth:  [birth year]     BSA:          1.866 m Patient Age:    69 years      BP:           148/74 mmHg Patient Gender: F             HR:           67 bpm. Exam Location:  Inpatient Procedure: 2D Echo, Cardiac Doppler, Color Doppler and Intracardiac            Opacification Agent Indications:    Stroke  History:        Patient has no prior history of Echocardiogram examinations.                 Stroke; Risk Factors:Hypertension and Dyslipidemia. Past history                 of breast cancer/.  Sonographer:    Leta Jungling RDCS Referring Phys: [id number] ALLISON WOLFE IMPRESSIONS  1. Left ventricular ejection fraction, by estimation, is 55 to 60%. The left ventricle has normal function. The left ventricle has no regional wall motion abnormalities. There is mild left ventricular hypertrophy. Left ventricular diastolic parameters are consistent with Grade I diastolic dysfunction (impaired relaxation). Elevated left atrial pressure.  2. Right ventricular systolic function is normal. The right ventricular size is normal.  3. The mitral valve is normal in structure. Trivial mitral valve regurgitation. No evidence of mitral stenosis.  4. The aortic valve is calcified. Aortic valve regurgitation is not visualized.  No aortic stenosis is present. FINDINGS  Left Ventricle: Left ventricular ejection fraction, by estimation, is 55 to 60%. The left ventricle has normal function. The left ventricle has no regional wall motion abnormalities. Definity contrast agent was given IV to delineate the left ventricular  endocardial borders. The left ventricular internal cavity size was normal in size. There is mild left ventricular hypertrophy. Left ventricular diastolic parameters are consistent with Grade I diastolic dysfunction (impaired relaxation). Elevated left atrial pressure. Right Ventricle: The right ventricular size is normal. Right ventricular systolic function is normal. Left Atrium: Left atrial size was normal in size. Right Atrium: Right atrial size was normal in size. Pericardium: There is no evidence of pericardial effusion. Mitral Valve: The mitral valve is normal in structure. Mild mitral annular calcification. Trivial mitral valve regurgitation. No evidence of mitral valve stenosis. Tricuspid Valve: The tricuspid valve is normal in structure. Tricuspid valve regurgitation is trivial. No evidence of tricuspid stenosis. Aortic Valve: The aortic valve is calcified. Aortic valve regurgitation is not  visualized. No aortic stenosis is present. Pulmonic Valve: The pulmonic valve was not well visualized. Pulmonic valve regurgitation is not visualized. No evidence of pulmonic stenosis. Aorta: The aortic root is normal in size and structure. Venous: The inferior vena cava was not well visualized. IAS/Shunts: The interatrial septum was not well visualized.  LEFT VENTRICLE PLAX 2D LVIDd:         3.30 cm     Diastology LVIDs:         2.40 cm     LV e' medial:    4.25 cm/s LV PW:         1.00 cm     LV E/e' medial:  14.8 LV IVS:        1.30 cm     LV e' lateral:   6.65 cm/s LVOT diam:     2.20 cm     LV E/e' lateral: 9.4 LV SV:         75 LV SV Index:   40 LVOT Area:     3.80 cm  LV Volumes (MOD) LV vol d, MOD A2C: 62.3 ml LV vol d,  MOD A4C: 66.2 ml LV vol s, MOD A2C: 17.7 ml LV vol s, MOD A4C: 16.4 ml LV SV MOD A2C:     44.6 ml LV SV MOD A4C:     66.2 ml LV SV MOD BP:      49.2 ml RIGHT VENTRICLE RV S prime:     11.20 cm/s LEFT ATRIUM             Index       RIGHT ATRIUM          Index LA diam:        2.20 cm 1.18 cm/m  RA Area:     7.57 cm LA Vol (A2C):   29.6 ml 15.86 ml/m RA Volume:   10.90 ml 5.84 ml/m LA Vol (A4C):   30.0 ml 16.07 ml/m LA Biplane Vol: 31.8 ml 17.04 ml/m  AORTIC VALVE LVOT Vmax:   81.30 cm/s LVOT Vmean:  63.400 cm/s LVOT VTI:    0.196 m  AORTA Ao Root diam: 2.70 cm MITRAL VALVE               TRICUSPID VALVE MV Area (PHT): 2.19 cm    TR Peak grad:   16.8 mmHg MV Decel Time: 346 msec    TR Vmax:        205.00 cm/s MV E velocity: 62.80 cm/s MV A velocity: 87.30 cm/s  SHUNTS MV E/A ratio:  0.72        Systemic VTI:  0.20 m                            Systemic Diam: 2.20 cm Olga Millers MD Electronically signed by Olga Millers MD Signature Date/Time: 05/26/2021/12:28:32 PM    Final    CT HEAD CODE STROKE WO CONTRAST  Result Date: 05/25/2021 CLINICAL DATA:  Code stroke.  Comment EXAM: CT HEAD WITHOUT CONTRAST TECHNIQUE: Contiguous axial images were obtained from the base of the skull through the vertex without intravenous contrast. COMPARISON:  None. FINDINGS: Brain: No evidence of acute large vascular territory infarction, hemorrhage, hydrocephalus, extra-axial collection or mass lesion/mass effect. 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 Additional remote infarcts in the right MCA territory. Advanced chronic microvascular ischemic disease. Moderate atrophy with ex vacuo ventricular dilation. Vascular:  No hyperdense vessel identified. Skull: No acute fracture. Sinuses/Orbits: Clear sinuses.  Unremarkable orbits. Other: No mastoid effusions. ASPECTS [facility] Stroke Program Early CT Score) total score (0-10 with 10 being normal): 10. IMPRESSION: 1.  No evidence of acute large vascular territory infarct or acute hemorrhage. ASPECTS is 10. 2. 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 Code stroke imaging results were communicated on 05/25/2021 at 2:38 pm to provider Dr. Pearlean Brownie via 2:37 p.m. Electronically Signed   By: Feliberto Harts MD   On: 05/25/2021 14:41        Scheduled Meds:  aspirin EC  81 mg Oral Daily   atorvastatin  40 mg Oral QHS   clopidogrel  75 mg Oral Daily   enoxaparin (LOVENOX) injection  40 mg Subcutaneous Q24H   irbesartan  150 mg Oral Daily   levETIRAcetam  500 mg Oral BID   mirtazapine  7.5 mg Oral QHS   Continuous Infusions:   LOS: 1 day    Time spent: 36    Burke Keels, MD [facility]  If 7PM-7AM, please contact night-coverage  05/26/2021, 3:33 PM

## 2021-05-27 MED ORDER — ASPIRIN 81 MG PO TBEC: 81.0000 mg | DELAYED_RELEASE_TABLET | Freq: Every day | ORAL | 0 refills | Status: DC

## 2021-05-27 NOTE — Plan of Care (Signed)
  Problem: Education: Goal: Knowledge of disease or condition will improve Outcome: Adequate for Discharge Goal: Knowledge of secondary prevention will improve Outcome: Adequate for Discharge Goal: Knowledge of patient specific risk factors addressed and post discharge goals established will improve Outcome: Adequate for Discharge Goal: Individualized Educational Video(s) Outcome: Adequate for Discharge   Problem: Coping: Goal: Will verbalize positive feelings about self Outcome: Adequate for Discharge Goal: Will identify appropriate support needs Outcome: Adequate for Discharge   Problem: Ischemic Stroke/TIA Tissue Perfusion: Goal: Complications of ischemic stroke/TIA will be minimized Outcome: Adequate for Discharge

## 2021-05-27 NOTE — TOC Transition Note (Signed)
Transition of Care [facility]) - CM/SW Discharge Note   Patient Details  Name: Brandi Stewart MRN: [id number] Date of Birth: 1951/06/11  [facility] of [facility]) CM/SW Contact:  Carley Hammed, LCSWA Phone Number: 05/27/2021, 11:27 AM   Clinical Narrative:    Pt to be transported via [facility] to [facility]. Nurse to call report to [phone].   Final next level of care: Skilled Nursing Facility Barriers to Discharge: Barriers Resolved   Patient Goals and CMS Choice Patient states their goals for this hospitalization and ongoing recovery are:: patient unable to participate in goal setting, not fully oriented CMS Medicare.gov Compare Post Acute Care list provided to:: Patient Represenative (must comment) Choice offered to / list presented to : Adult Children  Discharge Placement              Patient chooses bed at: [facility] Patient to be transferred to facility by: [facility] Name of family member notified: [name] Patient and family notified of of transfer: 05/27/21  Discharge Plan and Services     Post Acute Care Choice: Skilled Nursing Facility                               Social Determinants of Health (SDOH) Interventions     Readmission Risk Interventions No flowsheet data found.

## 2021-05-27 NOTE — Discharge Summary (Signed)
Physician Discharge Summary  Brandi Stewart ZOX:[id number] DOB: 09-01-1951 DOA: 05/25/2021  PCP: Pcp, No  Admit date: 05/25/2021 Discharge date: 05/27/2021  Admitted From: Inpatient Disposition: SNF  Recommendations for Outpatient Follow-up:  Follow up with PCP in 1-2 weeks Please obtain BMP/CBC in one week Please follow up on the following pending results:  Home Health:No Equipment/Devices:nop new equip  Discharge Condition:Fair CODE STATUS:Full code Diet recommendation: Cardiac diet  Brief/Interim Summary:  Brandi Stewart is a 70 y.o. 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 She also felt dizzy. EMS was called. Son is here to help with history.  He states his moms symptoms have improved but not back to baseline. She is right handed. History difficult from patient due to aphasia/dysarthria.    Son states that with stroke In [place] 2021 possibly had seizure and was put on medication. Per notes seizure could not be ruled out and keppra was started. EEG wtihout evidence of seizure. Seen at [facility] for previous strokes.    ED Course: code stroke intiated. Bp: 127/105, HR: 66, afebrile, RR: 16, oxygen: 98% on room air. No other pertinent labs. CTH with no acute infarct. Outside of tpa window. Neurology consulted. Asked to admit for stroke rule out  and management of medical conditions.  Right hemiparesis and exacerbation of aphasia MRI limited but consistent with subcortical infarct Discussed with neuro, rec'd continuing current outpt anticoag regimen -echo reviewed ef 55 grade 1 ddys, -a1c 6.2 -lipid pending -tele -pt/ot/st eval with recs fro SNF-Pt came from SNF-discussed with SW -continue plavix/asa for 3 weeks then plavix alone per neuro   CVA (cerebrovascular accident)  (HCC) -hx of past multiple CVAs -continue plavix/ASA as above -continued her keppra -tele -modify risk factors.       Essential hypertension -resumed home medications    Dyslipidemia -continue home meds, LIPITOR 40 DAILY    Discharge Diagnoses:  Principal Problem:   Acute CVA (cerebrovascular accident) [facility]) Active Problems:   Essential hypertension   Dyslipidemia    Discharge Instructions  Discharge Instructions     Call MD for:  difficulty breathing, headache or visual disturbances   Complete by: As directed    Call MD for:  persistant dizziness or light-headedness   Complete by: As directed    Call MD for:  persistant nausea and vomiting   Complete by: As directed    Call MD for:  temperature >100.4   Complete by: As directed    Diet - low sodium heart healthy   Complete by: As directed    Increase activity slowly   Complete by: As directed       Allergies as of 05/27/2021       Reactions   Statins Other (See Comments)   Not on [facility]        Medication List     STOP taking these medications    hydrALAZINE 25 MG tablet Commonly known as: APRESOLINE   Nyamyc powder Generic drug: nystatin       TAKE these medications    albuterol 108 (90 Base) MCG/ACT inhaler Commonly known as: VENTOLIN HFA Inhale 2 puffs into the lungs 2 (two) times daily as needed for wheezing or shortness of breath.   aspirin 81 MG EC tablet Take 1 tablet (81 mg total) by mouth daily  for 20 days. Swallow whole. Start taking on: May 28, 2021   atorvastatin 40 MG tablet Commonly known as: LIPITOR Take 40 mg by mouth at bedtime.   clopidogrel 75 MG tablet Commonly known as: PLAVIX Take 75 mg by mouth daily.   levETIRAcetam 500 MG tablet Commonly known as: KEPPRA Take 500 mg by mouth 2 (two) times daily.   melatonin 3 MG Tabs tablet Take 3 mg by mouth at bedtime.   mirtazapine 7.5 MG tablet Commonly known as: REMERON Take 7.5 mg by mouth at bedtime.    telmisartan 40 MG tablet Commonly known as: MICARDIS Take 40 mg by mouth daily.        Allergies  Allergen Reactions   Statins Other (See Comments)    Not on MAR    Consultations: neuro   Procedures/Studies: MR ANGIO HEAD WO CONTRAST  Result Date: 05/25/2021 CLINICAL DATA:  Neuro deficit, acute, stroke suspected. Additional history provided: Worsening right-sided weakness and aphasia. EXAM: MRA HEAD WITHOUT CONTRAST TECHNIQUE: Angiographic images of the Circle of Willis were acquired using MRA technique without intravenous contrast. COMPARISON:  Brain MRI 01/11/2021. CT angiogram head/neck 10/10/2020. FINDINGS: Anterior circulation: The intracranial internal carotid arteries are patent. The M1 middle cerebral arteries are patent. Progressive severe stenosis within the distal M1 right middle cerebral artery. Atherosclerotic irregularity of the M2 and more distal middle cerebral arteries bilaterally. However, no M2 proximal branch occlusion or high-grade proximal M2 stenosis is identified. Redemonstrated multifocal severe stenoses within the A2 and A3 right anterior cerebral artery. Flow related enhancement is poorly appreciated within the distal anterior cerebral artery. On the prior CTA head of 10/10/2020, only minimal thready enhancement of the distal right ACA was appreciated. The left anterior cerebral artery is patent. New severe stenosis versus artifact within the A3/A4 left anterior cerebral artery (series 307, image 2). Posterior circulation: The dominant intracranial right vertebral artery is patent without significant stenosis. The non dominant intracranial left vertebral artery is irregular and poorly delineated beyond the left PICA origin. This is at least partially related to developmentally small vessel size. There may be superimposed high-grade stenosis. The basilar artery is patent. The posterior cerebral arteries are patent. Redemonstrated high-grade stenosis within the P3  right posterior cerebral artery. Known high-grade stenosis within the P4 right PCA better appreciated on the prior CTA. The right posterior cerebral artery is fetal in origin. The left posterior communicating artery is hypoplastic or absent. Anatomic variants: As described IMPRESSION: Intracranial atherosclerotic disease with multifocal stenoses, most notably as follows. Progressive severe stenosis within the distal M1 right middle cerebral artery. Redemonstrated multifocal severe stenoses within the A2 and A3 right anterior cerebral artery. Flow related enhancement is poorly appreciated within the right anterior cerebral artery distal to this. On the prior CTA of 10/10/2020, this portion of the right anterior cerebral artery demonstrated only minimal thready enhancement. New severe stenosis versus artifact within the A3/A4 left anterior cerebral artery. The non dominant intracranial left vertebral artery is irregular and poorly delineated beyond the left PICA origin. This is at least partially related to developmentally small vessel size. There may be superimposed high-grade stenosis. 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 Electronically Signed   By: Jackey Loge DO   On: 05/25/2021 17:49   MR ANGIO NECK WO CONTRAST  Result Date: 05/25/2021 EXAM: MRA NECK WITHOUT CONTRAST TECHNIQUE: Multiplanar and multiecho pulse sequences of the neck were obtained without intravenous contrast. Angiographic images of the neck were obtained using  MRA technique without and with intravenous contrast. COMPARISON:  CT angiogram neck 10/10/2020. FINDINGS: Examination significantly limited by moderate to severe motion degradation and noncontrast technique. The common carotid and internal carotid arteries are patent within the neck. Within described limitations, no definite hemodynamically significant stenosis of the common carotid or internal carotid arteries within the neck is identified. The  vertebral arteries are patent within the neck with antegrade flow bilaterally. Motion degradation significantly limits evaluation for stenoses within these vessels. The right vertebral artery is dominant. IMPRESSION: Examination significantly limited by moderate to severe motion degradation and non-contrast technique. The common carotid and internal carotid arteries are patent within the neck. Within described limitations, no definite hemodynamically significant stenosis of the common carotid or internal carotid arteries is identified within the neck. The vertebral arteries are patent within the neck with antegrade flow bilaterally. Motion degradation significantly limits evaluation for stenoses within these vessels. Electronically Signed   By: Jackey Loge DO   On: 05/25/2021 18:09   MR BRAIN WO CONTRAST  Result Date: 05/25/2021 CLINICAL DATA:  Neuro deficit, acute, stroke suspected. Additional history provided: Patient reports worsening right-sided weakness and aphasia. EXAM: MRI HEAD WITHOUT CONTRAST TECHNIQUE: Multiplanar, multiecho pulse sequences of the brain and surrounding structures were obtained without intravenous contrast. COMPARISON:  Noncontrast head CT performed earlier today 05/25/2021. Brain MRI 01/11/2021. CT angiogram head/neck 10/10/2020. FINDINGS: The patient was unable to tolerate the full examination. As a result, only axial and coronal diffusion-weighted sequences (and the corresponding ADC sequences) could be obtained. There is moderate motion degradation of the coronal diffusion-weighted sequence. 2.0 x 0.8 x 1.8 cm focus of restricted diffusion within the right corona radiata compatible with acute infarction. No acute infarct is identified elsewhere within the brain. IMPRESSION: The patient was unable to tolerate the full examination. As a result, only diffusion-weighted imaging could be obtained. The acquired coronal diffusion-weighted sequence is moderately motion degraded. 2.0 x 0.8  x 1.8 cm acute infarct within the right corona radiata. No acute infarct identified elsewhere within the brain. Electronically Signed   By: Jackey Loge DO   On: 05/25/2021 17:33   DG Chest Portable 1 View  Result Date: 05/25/2021 CLINICAL DATA:  Shortness of breath. EXAM: PORTABLE CHEST 1 VIEW COMPARISON:  None. FINDINGS: Left chest wall port a catheter noted with tip in the projection of the SVC. Normal heart size. No pleural effusion or interstitial edema. No airspace consolidation. Scoliosis deformity identified within the thoracic spine, convex towards the right. IMPRESSION: No acute cardiopulmonary abnormalities. Electronically Signed   By: Signa Kell M.D.   On: 05/25/2021 18:42   EEG adult  Result Date: 05/26/2021 Charlsie Quest, MD     05/26/2021  1:32 PM Patient Name: Brandi Stewart MRN: [id number] Epilepsy Attending: Charlsie Quest Referring Physician/Provider: Lanae Boast, NP Date: 05/26/2021 Duration: 22.13 mins Patient history: 70 year old female presented with right hemiparesis and worsening aphasia.  EEG to evaluate for seizures. Level of alertness: Awake, asleep AEDs during EEG study: LEV Technical aspects: This EEG study was done with scalp electrodes positioned according to the 10-20 International system of electrode placement. Electrical activity was acquired at a sampling rate of 500Hz  and reviewed with a high frequency filter of 70Hz  and a low frequency filter of 1Hz . EEG data were recorded continuously and digitally stored. Description: The posterior dominant rhythm consists of 8-9 Hz activity of moderate voltage (25-35 uV) seen predominantly in posterior head regions, symmetric and reactive to eye opening and eye closing. Sleep  was characterized by vertex waves, sleep spindles (12 to 14 Hz), maximal frontocentral region. EEG showed intermittent generalized and maximal left frontotemporal region 3 to 6 Hz theta-delta slowing. Hyperventilation and photic stimulation were not  performed.   ABNORMALITY - Intermittent slow, generalized and maximal left frontotemporal region IMPRESSION: This study is suggestive of nonspecific cortical dysfunction in left frontotemporal region.  Additionally there is mild diffuse encephalopathy, nonspecific etiology.  No seizures or definite epileptiform discharges were seen throughout the recording. Charlsie Quest   ECHOCARDIOGRAM COMPLETE  Result Date: 05/26/2021    ECHOCARDIOGRAM REPORT   Patient Name:   Brandi Stewart Date of Exam: 05/26/2021 Medical Rec #:  [id number]     Height:       66.0 in Accession #:    [id number]    Weight:       170.0 lb Date of Birth:  02-12-1951     BSA:          1.866 m Patient Age:    69 years      BP:           148/74 mmHg Patient Gender: F             HR:           67 bpm. Exam Location:  Inpatient Procedure: 2D Echo, Cardiac Doppler, Color Doppler and Intracardiac            Opacification Agent Indications:    Stroke  History:        Patient has no prior history of Echocardiogram examinations.                 Stroke; Risk Factors:Hypertension and Dyslipidemia. Past history                 of breast cancer/.  Sonographer:    Leta Jungling RDCS Referring Phys: [id number] ALLISON WOLFE IMPRESSIONS  1. Left ventricular ejection fraction, by estimation, is 55 to 60%. The left ventricle has normal function. The left ventricle has no regional wall motion abnormalities. There is mild left ventricular hypertrophy. Left ventricular diastolic parameters are consistent with Grade I diastolic dysfunction (impaired relaxation). Elevated left atrial pressure.  2. Right ventricular systolic function is normal. The right ventricular size is normal.  3. The mitral valve is normal in structure. Trivial mitral valve regurgitation. No evidence of mitral stenosis.  4. The aortic valve is calcified. Aortic valve regurgitation is not visualized. No aortic stenosis is present. FINDINGS  Left Ventricle: Left ventricular ejection fraction, by  estimation, is 55 to 60%. The left ventricle has normal function. The left ventricle has no regional wall motion abnormalities. Definity contrast agent was given IV to delineate the left ventricular  endocardial borders. The left ventricular internal cavity size was normal in size. There is mild left ventricular hypertrophy. Left ventricular diastolic parameters are consistent with Grade I diastolic dysfunction (impaired relaxation). Elevated left atrial pressure. Right Ventricle: The right ventricular size is normal. Right ventricular systolic function is normal. Left Atrium: Left atrial size was normal in size. Right Atrium: Right atrial size was normal in size. Pericardium: There is no evidence of pericardial effusion. Mitral Valve: The mitral valve is normal in structure. Mild mitral annular calcification. Trivial mitral valve regurgitation. No evidence of mitral valve stenosis. Tricuspid Valve: The tricuspid valve is normal in structure. Tricuspid valve regurgitation is trivial. No evidence of tricuspid stenosis. Aortic Valve: The aortic valve is calcified. Aortic valve regurgitation is not visualized. No aortic stenosis  is present. Pulmonic Valve: The pulmonic valve was not well visualized. Pulmonic valve regurgitation is not visualized. No evidence of pulmonic stenosis. Aorta: The aortic root is normal in size and structure. Venous: The inferior vena cava was not well visualized. IAS/Shunts: The interatrial septum was not well visualized.  LEFT VENTRICLE PLAX 2D LVIDd:         3.30 cm     Diastology LVIDs:         2.40 cm     LV e' medial:    4.25 cm/s LV PW:         1.00 cm     LV E/e' medial:  14.8 LV IVS:        1.30 cm     LV e' lateral:   6.65 cm/s LVOT diam:     2.20 cm     LV E/e' lateral: 9.4 LV SV:         75 LV SV Index:   40 LVOT Area:     3.80 cm  LV Volumes (MOD) LV vol d, MOD A2C: 62.3 ml LV vol d, MOD A4C: 66.2 ml LV vol s, MOD A2C: 17.7 ml LV vol s, MOD A4C: 16.4 ml LV SV MOD A2C:     44.6 ml  LV SV MOD A4C:     66.2 ml LV SV MOD BP:      49.2 ml RIGHT VENTRICLE RV S prime:     11.20 cm/s LEFT ATRIUM             Index       RIGHT ATRIUM          Index LA diam:        2.20 cm 1.18 cm/m  RA Area:     7.57 cm LA Vol (A2C):   29.6 ml 15.86 ml/m RA Volume:   10.90 ml 5.84 ml/m LA Vol (A4C):   30.0 ml 16.07 ml/m LA Biplane Vol: 31.8 ml 17.04 ml/m  AORTIC VALVE LVOT Vmax:   81.30 cm/s LVOT Vmean:  63.400 cm/s LVOT VTI:    0.196 m  AORTA Ao Root diam: 2.70 cm MITRAL VALVE               TRICUSPID VALVE MV Area (PHT): 2.19 cm    TR Peak grad:   16.8 mmHg MV Decel Time: 346 msec    TR Vmax:        205.00 cm/s MV E velocity: 62.80 cm/s MV A velocity: 87.30 cm/s  SHUNTS MV E/A ratio:  0.72        Systemic VTI:  0.20 m                            Systemic Diam: 2.20 cm Olga Millers MD Electronically signed by Olga Millers MD Signature Date/Time: 05/26/2021/12:28:32 PM    Final    CT HEAD CODE STROKE WO CONTRAST  Result Date: 05/25/2021 CLINICAL DATA:  Code stroke.  Comment EXAM: CT HEAD WITHOUT CONTRAST TECHNIQUE: Contiguous axial images were obtained from the base of the skull through the vertex without intravenous contrast. COMPARISON:  None. FINDINGS: Brain: No evidence of acute large vascular territory infarction, hemorrhage, hydrocephalus, extra-axial collection or mass lesion/mass effect. 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 Additional remote infarcts in the right MCA territory. Advanced chronic microvascular ischemic disease. Moderate atrophy with ex vacuo ventricular dilation. Vascular: No hyperdense vessel identified.  Skull: No acute fracture. Sinuses/Orbits: Clear sinuses.  Unremarkable orbits. Other: No mastoid effusions. ASPECTS [facility] Stroke Program Early CT Score) total score (0-10 with 10 being normal): 10. IMPRESSION: 1. No evidence of acute large vascular territory infarct or acute hemorrhage. ASPECTS is 10. 2.  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 Code stroke imaging results were communicated on 05/25/2021 at 2:38 pm to provider Dr. Pearlean Brownie via 2:37 p.m. Electronically Signed   By: Feliberto Harts MD   On: 05/25/2021 14:41      Subjective: Stable no acute events overnight Discussed wqith SW pt able to return to ALF facility with SNF auth pending  Discharge Exam: Vitals:   05/27/21 0345 05/27/21 0812  BP: (!) 160/82 (!) 144/96  Pulse: (!) 56 66  Resp: 16 16  Temp: (!) 97.5 F (36.4 C) 98.1 F (36.7 C)  SpO2: 97% 99%   Vitals:   05/26/21 2036 05/26/21 2346 05/27/21 0345 05/27/21 0812  BP: (!) 158/99 (!) 153/89 (!) 160/82 (!) 144/96  Pulse:  90 (!) 56 66  Resp: Temp: 99 F (37.2 C) 98.4 F (36.9 C) (!) 97.5 F (36.4 C) 98.1 F (36.7 C)  TempSrc: Oral Oral Oral Oral  SpO2: 100% 97% 97% 99%  Weight:      Height:        General: Pt isawake, no changes Cardiovascular: RRR, S1/S2 +, no rubs, no gallops Respiratory: CTA bilaterally, no wheezing, no rhonchi Abdominal: Soft, NT, ND, bowel sounds + Extremities: no edema, no cyanosis    The results of significant diagnostics from this hospitalization (including imaging, microbiology, ancillary and laboratory) are listed below for reference.     Microbiology: Recent Results (from the past 240 hour(s))  Resp Panel by RT-PCR (Flu A&B, Covid) Nasopharyngeal Swab     Status: None   Collection Time: 05/25/21  3:19 PM   Specimen: Nasopharyngeal Swab; Nasopharyngeal(NP) swabs in vial transport medium  Result Value Ref Range Status   SARS Coronavirus 2 by RT PCR NEGATIVE NEGATIVE Final    Comment: (NOTE) SARS-CoV-2 target nucleic acids are NOT DETECTED.  The SARS-CoV-2 RNA is generally detectable in upper respiratory specimens during the acute phase of infection. The lowest concentration of SARS-CoV-2 viral copies this assay can detect is 138 copies/mL. A negative result does not  preclude SARS-Cov-2 infection and should not be used as the sole basis for treatment or other patient management decisions. A negative result may occur with  improper specimen collection/handling, submission of specimen other than nasopharyngeal swab, presence of viral mutation(s) within the areas targeted by this assay, and inadequate number of viral copies(<138 copies/mL). A negative result must be combined with clinical observations, patient history, and epidemiological information. The expected result is Negative.  Fact Sheet for Patients:  BloggerCourse.com  Fact Sheet for Healthcare Providers:  SeriousBroker.it  This test is no t yet approved or cleared by the [place] FDA and  has been authorized for detection and/or diagnosis of SARS-CoV-2 by FDA under an Emergency Use Authorization (EUA). This EUA will remain  in effect (meaning this test can be used) for the duration of the COVID-19 declaration under Section 564(b)(1) of the Act, 21 U.S.C.section 360bbb-3(b)(1), unless the authorization is terminated  or revoked sooner.       Influenza A by PCR NEGATIVE NEGATIVE Final   Influenza B by PCR NEGATIVE NEGATIVE Final    Comment: (NOTE) The Xpert Xpress SARS-CoV-2/FLU/RSV plus assay is intended as an aid  in the diagnosis of influenza from Nasopharyngeal swab specimens and should not be used as a sole basis for treatment. Nasal washings and aspirates are unacceptable for Xpert Xpress SARS-CoV-2/FLU/RSV testing.  Fact Sheet for Patients: BloggerCourse.com  Fact Sheet for Healthcare Providers: SeriousBroker.it  This test is not yet approved or cleared by the [place] FDA and has been authorized for detection and/or diagnosis of SARS-CoV-2 by FDA under an Emergency Use Authorization (EUA). This EUA will remain in effect (meaning this test can be used) for the  duration of the COVID-19 declaration under Section 564(b)(1) of the Act, 21 U.S.C. section 360bbb-3(b)(1), unless the authorization is terminated or revoked.  Performed at [facility], [address], [place], [place] [address]   Culture, blood (routine x 2)     Status: None (Preliminary result)   Collection Time: 05/25/21  8:57 PM   Specimen: BLOOD LEFT HAND  Result Value Ref Range Status   Specimen Description BLOOD LEFT HAND  Final   Special Requests   Final    BOTTLES DRAWN AEROBIC ONLY Blood Culture adequate volume   Culture  Setup Time   Final    AEROBIC BOTTLE ONLY GRAM POSITIVE COCCI CRITICAL RESULT CALLED TO, READ BACK BY AND VERIFIED WITH: Tori Milks [facility] 05/26/21 2303 JDW Performed at [facility], [address], [place], [place] [address]    Culture GRAM POSITIVE COCCI  Final   Report Status PENDING  Incomplete  Culture, blood (routine x 2)     Status: None (Preliminary result)   Collection Time: 05/25/21  8:57 PM   Specimen: BLOOD  Result Value Ref Range Status   Specimen Description BLOOD SITE NOT SPECIFIED  Final   Special Requests   Final    BOTTLES DRAWN AEROBIC AND ANAEROBIC Blood Culture adequate volume   Culture   Final    NO GROWTH 1 DAY Performed at [facility], [address] [place], [place] [address]    Report Status PENDING  Incomplete  Blood Culture ID Panel (Reflexed)     Status: Abnormal   Collection Time: 05/25/21  8:57 PM  Result Value Ref Range Status   Enterococcus faecalis NOT DETECTED NOT DETECTED Final   Enterococcus Faecium NOT DETECTED NOT DETECTED Final   Listeria monocytogenes NOT DETECTED NOT DETECTED Final   Staphylococcus species NOT DETECTED NOT DETECTED Final   Staphylococcus aureus (BCID) NOT DETECTED NOT DETECTED Final   Staphylococcus epidermidis NOT DETECTED NOT DETECTED Final   Staphylococcus lugdunensis NOT DETECTED NOT DETECTED Final   Streptococcus species DETECTED (A) NOT DETECTED Final    Comment:  Not Enterococcus species, Streptococcus agalactiae, Streptococcus pyogenes, or Streptococcus pneumoniae. CRITICAL RESULT CALLED TO, READ BACK BY AND VERIFIED WITH: L SEAY PHARMD 05/26/21 2303 JDW    Streptococcus agalactiae NOT DETECTED NOT DETECTED Final   Streptococcus pneumoniae NOT DETECTED NOT DETECTED Final   Streptococcus pyogenes NOT DETECTED NOT DETECTED Final   A.calcoaceticus-baumannii NOT DETECTED NOT DETECTED Final   Bacteroides fragilis NOT DETECTED NOT DETECTED Final   Enterobacterales NOT DETECTED NOT DETECTED Final   Enterobacter cloacae complex NOT DETECTED NOT DETECTED Final   Escherichia coli NOT DETECTED NOT DETECTED Final   Klebsiella aerogenes NOT DETECTED NOT DETECTED Final   Klebsiella oxytoca NOT DETECTED NOT DETECTED Final   Klebsiella pneumoniae NOT DETECTED NOT DETECTED Final   Proteus species NOT DETECTED NOT DETECTED Final   Salmonella species NOT DETECTED NOT DETECTED Final   Serratia marcescens NOT DETECTED  NOT DETECTED Final   Haemophilus influenzae NOT DETECTED NOT DETECTED Final   Neisseria meningitidis NOT DETECTED NOT DETECTED Final   Pseudomonas aeruginosa NOT DETECTED NOT DETECTED Final   Stenotrophomonas maltophilia NOT DETECTED NOT DETECTED Final   Candida albicans NOT DETECTED NOT DETECTED Final   Candida auris NOT DETECTED NOT DETECTED Final   Candida glabrata NOT DETECTED NOT DETECTED Final   Candida krusei NOT DETECTED NOT DETECTED Final   Candida parapsilosis NOT DETECTED NOT DETECTED Final   Candida tropicalis NOT DETECTED NOT DETECTED Final   Cryptococcus neoformans/gattii NOT DETECTED NOT DETECTED Final    Comment: Performed at [facility], [address] [place], [place] [address]     Labs: BNP (last 3 results) No results for input(s): BNP in the last 8760 hours. Basic Metabolic Panel: Recent Labs  Lab 05/25/21 1414 05/25/21 1425  NA 143 144  K 4.1 4.0  CL 109 108  CO2 24  --   GLUCOSE 116* 117*  BUN 10 11   CREATININE 0.77 0.70  CALCIUM 9.5  --    Liver Function Tests: Recent Labs  Lab 05/25/21 1414  AST 22  ALT 20  ALKPHOS 89  BILITOT 0.3  PROT 6.8  ALBUMIN 3.4*   No results for input(s): LIPASE, AMYLASE in the last 168 hours. No results for input(s): AMMONIA in the last 168 hours. CBC: Recent Labs  Lab 05/25/21 1414 05/25/21 1425  WBC 6.6  --   NEUTROABS 3.6  --   HGB 12.7 14.6  HCT 42.4 43.0  MCV 93.6  --   PLT 280  --    Cardiac Enzymes: No results for input(s): CKTOTAL, CKMB, CKMBINDEX, TROPONINI in the last 168 hours. BNP: Invalid input(s): POCBNP CBG: Recent Labs  Lab 05/25/21 1413  GLUCAP 121*   D-Dimer No results for input(s): DDIMER in the last 72 hours. Hgb A1c Recent Labs    05/25/21 1414  HGBA1C 6.2*   Lipid Profile No results for input(s): CHOL, HDL, LDLCALC, TRIG, CHOLHDL, LDLDIRECT in the last 72 hours. Thyroid function studies No results for input(s): TSH, T4TOTAL, T3FREE, THYROIDAB in the last 72 hours.  Invalid input(s): FREET3 Anemia work up No results for input(s): VITAMINB12, FOLATE, FERRITIN, TIBC, IRON, RETICCTPCT in the last 72 hours. Urinalysis    Component Value Date/Time   COLORURINE YELLOW 05/26/2021 1730   APPEARANCEUR HAZY (A) 05/26/2021 1730   LABSPEC 1.013 05/26/2021 1730   PHURINE 7.0 05/26/2021 1730   GLUCOSEU NEGATIVE 05/26/2021 1730   HGBUR NEGATIVE 05/26/2021 1730   BILIRUBINUR NEGATIVE 05/26/2021 1730   KETONESUR 5 (A) 05/26/2021 1730   PROTEINUR NEGATIVE 05/26/2021 1730   NITRITE POSITIVE (A) 05/26/2021 1730   LEUKOCYTESUR TRACE (A) 05/26/2021 1730   Sepsis Labs Invalid input(s): PROCALCITONIN,  WBC,  LACTICIDVEN Microbiology Recent Results (from the past 240 hour(s))  Resp Panel by RT-PCR (Flu A&B, Covid) Nasopharyngeal Swab     Status: None   Collection Time: 05/25/21  3:19 PM   Specimen: Nasopharyngeal Swab; Nasopharyngeal(NP) swabs in vial transport medium  Result Value Ref Range Status   SARS  Coronavirus 2 by RT PCR NEGATIVE NEGATIVE Final    Comment: (NOTE) SARS-CoV-2 target nucleic acids are NOT DETECTED.  The SARS-CoV-2 RNA is generally detectable in upper respiratory specimens during the acute phase of infection. The lowest concentration of SARS-CoV-2 viral copies this assay can detect is 138 copies/mL. A negative result does not preclude SARS-Cov-2 infection and should not be used as the sole  basis for treatment or other patient management decisions. A negative result may occur with  improper specimen collection/handling, submission of specimen other than nasopharyngeal swab, presence of viral mutation(s) within the areas targeted by this assay, and inadequate number of viral copies(<138 copies/mL). A negative result must be combined with clinical observations, patient history, and epidemiological information. The expected result is Negative.  Fact Sheet for Patients:  BloggerCourse.comhttps://www.fda.gov/media/152166/download  Fact Sheet for Healthcare Providers:  SeriousBroker.ithttps://www.fda.gov/media/152162/download  This test is no t yet approved or cleared by the [place] FDA and  has been authorized for detection and/or diagnosis of SARS-CoV-2 by FDA under an Emergency Use Authorization (EUA). This EUA will remain  in effect (meaning this test can be used) for the duration of the COVID-19 declaration under Section 564(b)(1) of the Act, 21 U.S.C.section 360bbb-3(b)(1), unless the authorization is terminated  or revoked sooner.       Influenza A by PCR NEGATIVE NEGATIVE Final   Influenza B by PCR NEGATIVE NEGATIVE Final    Comment: (NOTE) The Xpert Xpress SARS-CoV-2/FLU/RSV plus assay is intended as an aid in the diagnosis of influenza from Nasopharyngeal swab specimens and should not be used as a sole basis for treatment. Nasal washings and aspirates are unacceptable for Xpert Xpress SARS-CoV-2/FLU/RSV testing.  Fact Sheet for  Patients: BloggerCourse.comhttps://www.fda.gov/media/152166/download  Fact Sheet for Healthcare Providers: SeriousBroker.ithttps://www.fda.gov/media/152162/download  This test is not yet approved or cleared by the [place] FDA and has been authorized for detection and/or diagnosis of SARS-CoV-2 by FDA under an Emergency Use Authorization (EUA). This EUA will remain in effect (meaning this test can be used) for the duration of the COVID-19 declaration under Section 564(b)(1) of the Act, 21 U.S.C. section 360bbb-3(b)(1), unless the authorization is terminated or revoked.  Performed at [facility], [address] [place], [place] [address]   Culture, blood (routine x 2)     Status: None (Preliminary result)   Collection Time: 05/25/21  8:57 PM   Specimen: BLOOD LEFT HAND  Result Value Ref Range Status   Specimen Description BLOOD LEFT HAND  Final   Special Requests   Final    BOTTLES DRAWN AEROBIC ONLY Blood Culture adequate volume   Culture  Setup Time   Final    AEROBIC BOTTLE ONLY GRAM POSITIVE COCCI CRITICAL RESULT CALLED TO, READ BACK BY AND VERIFIED WITH: Tori MilksL SEAY [facility] 05/26/21 2303 JDW Performed at [facility], [address] [place], [place] [address]    Culture GRAM POSITIVE COCCI  Final   Report Status PENDING  Incomplete  Culture, blood (routine x 2)     Status: None (Preliminary result)   Collection Time: 05/25/21  8:57 PM   Specimen: BLOOD  Result Value Ref Range Status   Specimen Description BLOOD SITE NOT SPECIFIED  Final   Special Requests   Final    BOTTLES DRAWN AEROBIC AND ANAEROBIC Blood Culture adequate volume   Culture   Final    NO GROWTH 1 DAY Performed at [facility], [address] [place], [place] [address]    Report Status PENDING  Incomplete  Blood Culture ID Panel (Reflexed)     Status: Abnormal   Collection Time: 05/25/21  8:57 PM  Result Value Ref Range Status   Enterococcus faecalis NOT DETECTED NOT DETECTED Final   Enterococcus Faecium NOT  DETECTED NOT DETECTED Final   Listeria monocytogenes NOT DETECTED NOT DETECTED Final   Staphylococcus species NOT DETECTED NOT DETECTED Final   Staphylococcus  aureus (BCID) NOT DETECTED NOT DETECTED Final   Staphylococcus epidermidis NOT DETECTED NOT DETECTED Final   Staphylococcus lugdunensis NOT DETECTED NOT DETECTED Final   Streptococcus species DETECTED (A) NOT DETECTED Final    Comment: Not Enterococcus species, Streptococcus agalactiae, Streptococcus pyogenes, or Streptococcus pneumoniae. CRITICAL RESULT CALLED TO, READ BACK BY AND VERIFIED WITH: L SEAY PHARMD 05/26/21 2303 JDW    Streptococcus agalactiae NOT DETECTED NOT DETECTED Final   Streptococcus pneumoniae NOT DETECTED NOT DETECTED Final   Streptococcus pyogenes NOT DETECTED NOT DETECTED Final   A.calcoaceticus-baumannii NOT DETECTED NOT DETECTED Final   Bacteroides fragilis NOT DETECTED NOT DETECTED Final   Enterobacterales NOT DETECTED NOT DETECTED Final   Enterobacter cloacae complex NOT DETECTED NOT DETECTED Final   Escherichia coli NOT DETECTED NOT DETECTED Final   Klebsiella aerogenes NOT DETECTED NOT DETECTED Final   Klebsiella oxytoca NOT DETECTED NOT DETECTED Final   Klebsiella pneumoniae NOT DETECTED NOT DETECTED Final   Proteus species NOT DETECTED NOT DETECTED Final   Salmonella species NOT DETECTED NOT DETECTED Final   Serratia marcescens NOT DETECTED NOT DETECTED Final   Haemophilus influenzae NOT DETECTED NOT DETECTED Final   Neisseria meningitidis NOT DETECTED NOT DETECTED Final   Pseudomonas aeruginosa NOT DETECTED NOT DETECTED Final   Stenotrophomonas maltophilia NOT DETECTED NOT DETECTED Final   Candida albicans NOT DETECTED NOT DETECTED Final   Candida auris NOT DETECTED NOT DETECTED Final   Candida glabrata NOT DETECTED NOT DETECTED Final   Candida krusei NOT DETECTED NOT DETECTED Final   Candida parapsilosis NOT DETECTED NOT DETECTED Final   Candida tropicalis NOT DETECTED NOT DETECTED Final    Cryptococcus neoformans/gattii NOT DETECTED NOT DETECTED Final    Comment: Performed at [facility], [address] [place], [place] [address]     Time coordinating discharge: Over 30 minutes  SIGNED:   Burke Keels, MD  [facility] 05/27/2021, 10:50 AM Pager   If 7PM-7AM, please contact night-coverage [email or web address] Password [email or web address]

## 2021-05-27 NOTE — Progress Notes (Signed)
Call placed to [facility] nursing facility and report was given [place], LPN. Nurse verbalized understanding and informed him that transport was behind in schedule and would take a while. [place], LPN verbalized understanding.

## 2021-05-28 LAB — CULTURE, BLOOD (ROUTINE X 2): Special Requests: ADEQUATE

## 2021-05-30 ENCOUNTER — Observation Stay (HOSPITAL_COMMUNITY): Payer: Medicare HMO

## 2021-05-30 ENCOUNTER — Other Ambulatory Visit: Payer: Self-pay

## 2021-05-30 ENCOUNTER — Encounter (HOSPITAL_COMMUNITY): Payer: Self-pay

## 2021-05-30 ENCOUNTER — Emergency Department (HOSPITAL_COMMUNITY): Payer: Medicare HMO

## 2021-05-30 ENCOUNTER — Inpatient Hospital Stay (HOSPITAL_COMMUNITY)
Admission: EM | Admit: 2021-05-30 | Discharge: 2021-06-06 | DRG: 065 | Disposition: A | Discharge status: Hospice | Payer: Medicare HMO | Source: Skilled Nursing Facility | Attending: Internal Medicine | Admitting: Internal Medicine

## 2021-05-30 DIAGNOSIS — Z79899 Other long term (current) drug therapy: Secondary | ICD-10-CM

## 2021-05-30 DIAGNOSIS — Z7189 Other specified counseling: Secondary | ICD-10-CM | POA: Diagnosis not present

## 2021-05-30 DIAGNOSIS — R2990 Unspecified symptoms and signs involving the nervous system: Secondary | ICD-10-CM | POA: Diagnosis not present

## 2021-05-30 DIAGNOSIS — E785 Hyperlipidemia, unspecified: Secondary | ICD-10-CM | POA: Diagnosis present

## 2021-05-30 DIAGNOSIS — I959 Hypotension, unspecified: Secondary | ICD-10-CM | POA: Diagnosis not present

## 2021-05-30 DIAGNOSIS — I6381 Other cerebral infarction due to occlusion or stenosis of small artery: Secondary | ICD-10-CM | POA: Diagnosis present

## 2021-05-30 DIAGNOSIS — Z993 Dependence on wheelchair: Secondary | ICD-10-CM

## 2021-05-30 DIAGNOSIS — R1312 Dysphagia, oropharyngeal phase: Secondary | ICD-10-CM | POA: Diagnosis present

## 2021-05-30 DIAGNOSIS — G9349 Other encephalopathy: Secondary | ICD-10-CM | POA: Diagnosis present

## 2021-05-30 DIAGNOSIS — R7989 Other specified abnormal findings of blood chemistry: Secondary | ICD-10-CM | POA: Diagnosis present

## 2021-05-30 DIAGNOSIS — I69391 Dysphagia following cerebral infarction: Secondary | ICD-10-CM | POA: Diagnosis not present

## 2021-05-30 DIAGNOSIS — I6502 Occlusion and stenosis of left vertebral artery: Secondary | ICD-10-CM | POA: Diagnosis present

## 2021-05-30 DIAGNOSIS — I679 Cerebrovascular disease, unspecified: Secondary | ICD-10-CM | POA: Diagnosis not present

## 2021-05-30 DIAGNOSIS — E7800 Pure hypercholesterolemia, unspecified: Secondary | ICD-10-CM | POA: Diagnosis not present

## 2021-05-30 DIAGNOSIS — I63511 Cerebral infarction due to unspecified occlusion or stenosis of right middle cerebral artery: Secondary | ICD-10-CM | POA: Diagnosis not present

## 2021-05-30 DIAGNOSIS — I69320 Aphasia following cerebral infarction: Secondary | ICD-10-CM

## 2021-05-30 DIAGNOSIS — Z515 Encounter for palliative care: Secondary | ICD-10-CM

## 2021-05-30 DIAGNOSIS — G8194 Hemiplegia, unspecified affecting left nondominant side: Secondary | ICD-10-CM | POA: Diagnosis present

## 2021-05-30 DIAGNOSIS — I69351 Hemiplegia and hemiparesis following cerebral infarction affecting right dominant side: Secondary | ICD-10-CM | POA: Diagnosis not present

## 2021-05-30 DIAGNOSIS — R1310 Dysphagia, unspecified: Secondary | ICD-10-CM | POA: Diagnosis not present

## 2021-05-30 DIAGNOSIS — I6930 Unspecified sequelae of cerebral infarction: Secondary | ICD-10-CM | POA: Diagnosis not present

## 2021-05-30 DIAGNOSIS — R29810 Facial weakness: Secondary | ICD-10-CM | POA: Diagnosis present

## 2021-05-30 DIAGNOSIS — I639 Cerebral infarction, unspecified: Secondary | ICD-10-CM | POA: Diagnosis present

## 2021-05-30 DIAGNOSIS — Z66 Do not resuscitate: Secondary | ICD-10-CM | POA: Diagnosis not present

## 2021-05-30 DIAGNOSIS — Z853 Personal history of malignant neoplasm of breast: Secondary | ICD-10-CM

## 2021-05-30 DIAGNOSIS — I5032 Chronic diastolic (congestive) heart failure: Secondary | ICD-10-CM | POA: Diagnosis present

## 2021-05-30 DIAGNOSIS — R627 Adult failure to thrive: Secondary | ICD-10-CM | POA: Diagnosis present

## 2021-05-30 DIAGNOSIS — R7303 Prediabetes: Secondary | ICD-10-CM | POA: Diagnosis present

## 2021-05-30 DIAGNOSIS — I6611 Occlusion and stenosis of right anterior cerebral artery: Secondary | ICD-10-CM | POA: Diagnosis present

## 2021-05-30 DIAGNOSIS — Z9010 Acquired absence of unspecified breast and nipple: Secondary | ICD-10-CM

## 2021-05-30 DIAGNOSIS — E860 Dehydration: Secondary | ICD-10-CM | POA: Diagnosis present

## 2021-05-30 DIAGNOSIS — Z20822 Contact with and (suspected) exposure to covid-19: Secondary | ICD-10-CM | POA: Diagnosis present

## 2021-05-30 DIAGNOSIS — R531 Weakness: Secondary | ICD-10-CM | POA: Diagnosis not present

## 2021-05-30 DIAGNOSIS — R29728 NIHSS score 28: Secondary | ICD-10-CM | POA: Diagnosis present

## 2021-05-30 DIAGNOSIS — R824 Acetonuria: Secondary | ICD-10-CM | POA: Diagnosis not present

## 2021-05-30 DIAGNOSIS — Z7902 Long term (current) use of antithrombotics/antiplatelets: Secondary | ICD-10-CM

## 2021-05-30 DIAGNOSIS — Z7982 Long term (current) use of aspirin: Secondary | ICD-10-CM

## 2021-05-30 DIAGNOSIS — I110 Hypertensive heart disease with heart failure: Secondary | ICD-10-CM | POA: Diagnosis present

## 2021-05-30 HISTORY — DX: Cognitive communication deficit: R41.841

## 2021-05-30 HISTORY — DX: Dysphagia, oropharyngeal phase: R13.12

## 2021-05-30 HISTORY — DX: Muscle weakness (generalized): M62.81

## 2021-05-30 HISTORY — DX: Other abnormalities of gait and mobility: R26.89

## 2021-05-30 HISTORY — DX: Dysphagia following unspecified cerebrovascular disease: I69.991

## 2021-05-30 HISTORY — DX: Hemiplegia, unspecified affecting right dominant side: G81.91

## 2021-05-30 HISTORY — DX: Personal history of malignant neoplasm of breast: Z85.3

## 2021-05-30 LAB — COMPREHENSIVE METABOLIC PANEL
ALT: 22 U/L (ref 0–44)
AST: 33 U/L (ref 15–41)
Albumin: 3.7 g/dL (ref 3.5–5.0)
Alkaline Phosphatase: 79 U/L (ref 38–126)
Anion gap: 14 (ref 5–15)
BUN: 20 mg/dL (ref 8–23)
CO2: 18 mmol/L — ABNORMAL LOW (ref 22–32)
Calcium: 9.6 mg/dL (ref 8.9–10.3)
Chloride: 108 mmol/L (ref 98–111)
Creatinine, Ser: 1.08 mg/dL — ABNORMAL HIGH (ref 0.44–1.00)
GFR, Estimated: 55 mL/min — ABNORMAL LOW (ref >60)
Glucose, Bld: 122 mg/dL — ABNORMAL HIGH (ref 70–99)
Potassium: 4.5 mmol/L (ref 3.5–5.1)
Sodium: 140 mmol/L (ref 135–145)
Total Bilirubin: 1.3 mg/dL — ABNORMAL HIGH (ref 0.3–1.2)
Total Protein: 7.2 g/dL (ref 6.5–8.1)

## 2021-05-30 LAB — I-STAT CHEM 8, ED
BUN: 25 mg/dL — ABNORMAL HIGH (ref 8–23)
Calcium, Ion: 1.17 mmol/L (ref 1.15–1.40)
Chloride: 110 mmol/L (ref 98–111)
Creatinine, Ser: 0.8 mg/dL (ref 0.44–1.00)
Glucose, Bld: 125 mg/dL — ABNORMAL HIGH (ref 70–99)
HCT: 42 % (ref 36.0–46.0)
Hemoglobin: 14.3 g/dL (ref 12.0–15.0)
Potassium: 4.1 mmol/L (ref 3.5–5.1)
Sodium: 143 mmol/L (ref 135–145)
TCO2: 20 mmol/L — ABNORMAL LOW (ref 22–32)

## 2021-05-30 LAB — CBC WITH DIFFERENTIAL/PLATELET
Abs Immature Granulocytes: 0.04 10*3/uL (ref 0.00–0.07)
Basophils Absolute: 0 10*3/uL (ref 0.0–0.1)
Basophils Relative: 0 %
Eosinophils Absolute: 0 10*3/uL (ref 0.0–0.5)
Eosinophils Relative: 0 %
HCT: 42.9 % (ref 36.0–46.0)
Hemoglobin: 13.3 g/dL (ref 12.0–15.0)
Immature Granulocytes: 0 %
Lymphocytes Relative: 9 %
Lymphs Abs: 0.9 10*3/uL (ref 0.7–4.0)
MCH: 28.4 pg (ref 26.0–34.0)
MCHC: 31 g/dL (ref 30.0–36.0)
MCV: 91.7 fL (ref 80.0–100.0)
Monocytes Absolute: 0.6 10*3/uL (ref 0.1–1.0)
Monocytes Relative: 6 %
Neutro Abs: 8.4 10*3/uL — ABNORMAL HIGH (ref 1.7–7.7)
Neutrophils Relative %: 85 %
Platelets: 363 10*3/uL (ref 150–400)
RBC: 4.68 MIL/uL (ref 3.87–5.11)
RDW: 14.3 % (ref 11.5–15.5)
WBC: 9.9 10*3/uL (ref 4.0–10.5)
nRBC: 0 % (ref 0.0–0.2)

## 2021-05-30 LAB — URINALYSIS, ROUTINE W REFLEX MICROSCOPIC
Bilirubin Urine: NEGATIVE
Glucose, UA: NEGATIVE mg/dL
Hgb urine dipstick: NEGATIVE
Ketones, ur: 80 mg/dL — AB
Nitrite: NEGATIVE
Protein, ur: 30 mg/dL — AB
Specific Gravity, Urine: 1.025 (ref 1.005–1.030)
pH: 5 (ref 5.0–8.0)

## 2021-05-30 LAB — APTT: aPTT: 22 seconds — ABNORMAL LOW (ref 24–36)

## 2021-05-30 LAB — LACTIC ACID, PLASMA: Lactic Acid, Venous: 2.6 mmol/L (ref 0.5–1.9)

## 2021-05-30 LAB — LIPID PANEL
Cholesterol: 198 mg/dL (ref 0–200)
HDL: 49 mg/dL (ref >40)
LDL Cholesterol: 134 mg/dL — ABNORMAL HIGH (ref 0–99)
Total CHOL/HDL Ratio: 4 RATIO
Triglycerides: 74 mg/dL (ref <150)
VLDL: 15 mg/dL (ref 0–40)

## 2021-05-30 LAB — PROTIME-INR
INR: 1.1 (ref 0.8–1.2)
Prothrombin Time: 13.8 seconds (ref 11.4–15.2)

## 2021-05-30 LAB — RESP PANEL BY RT-PCR (FLU A&B, COVID) ARPGX2
Influenza A by PCR: NEGATIVE
Influenza B by PCR: NEGATIVE
SARS Coronavirus 2 by RT PCR: NEGATIVE

## 2021-05-30 LAB — CBG MONITORING, ED: Glucose-Capillary: 86 mg/dL (ref 70–99)

## 2021-05-30 IMAGING — CT CT HEAD W/O CM
4 series · 16 of 47 positions shown, 18 images · non-contrast
Comparison: [DATE] head CT and brain MRI

CLINICAL DATA: Seizure

EXAM:
CT HEAD WITHOUT CONTRAST
TECHNIQUE: Contiguous axial images were obtained from the base of the skull
through the vertex without intravenous contrast.

[Series 3: head 2.0 bone · axial · 0.48mm/px · z∈[-107,-71]mm · 3 of 90 slices shown]
[im 9/90  bone]
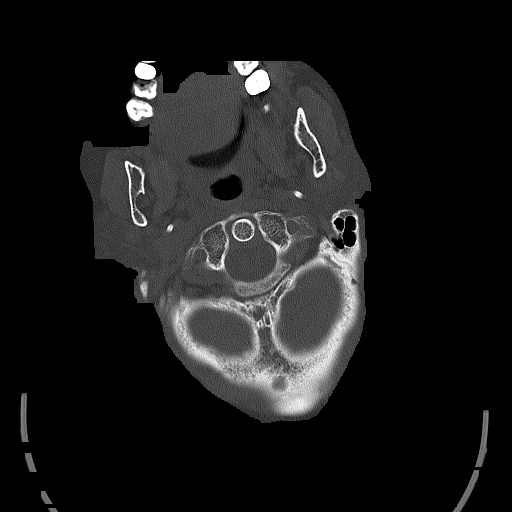
[im 18/90  bone]
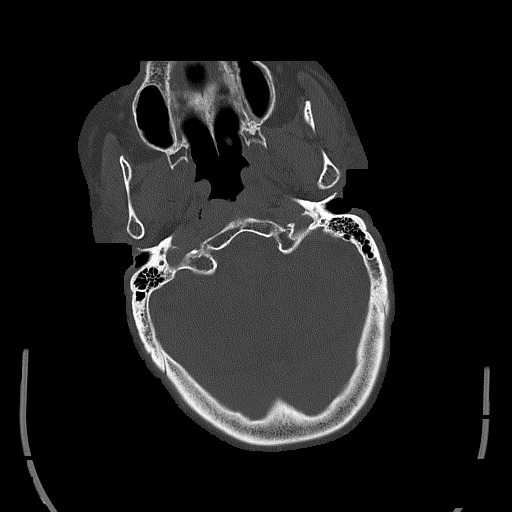
[im 27/90  bone]
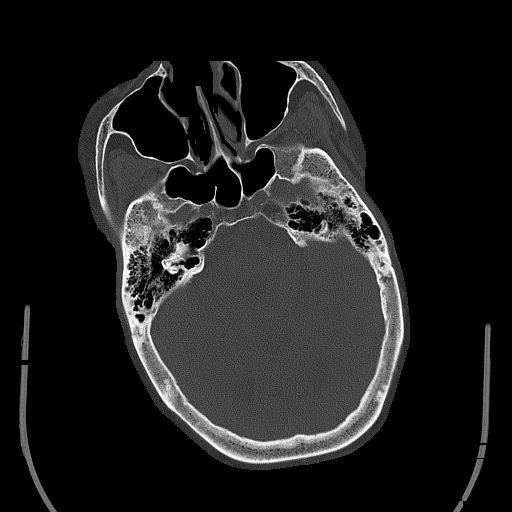

[Series 4: head 5.0 st · axial · 0.48mm/px · z∈[-103,+27]mm · 7 of 36 slices shown, 9 images]
[im 5/36  brain]
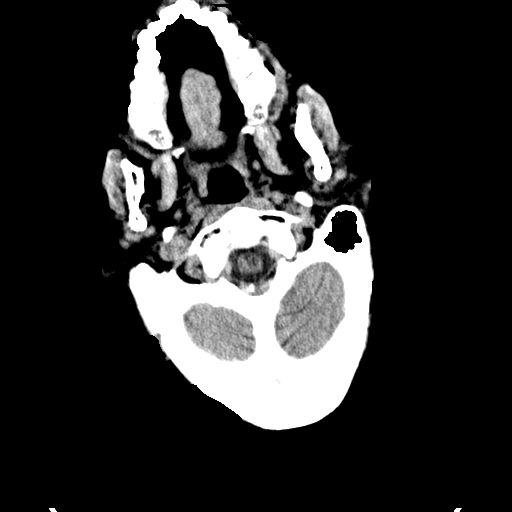
[im 5/36  bone]
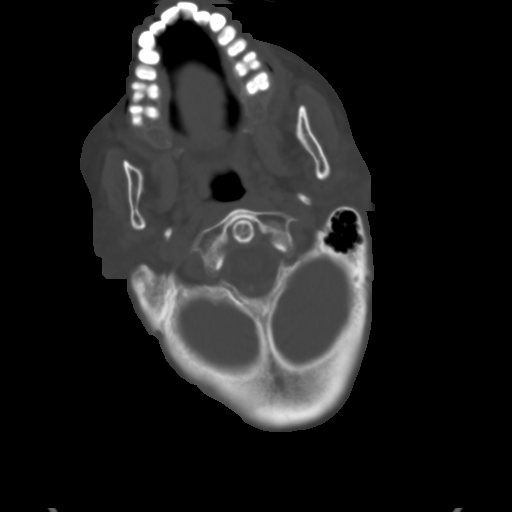
[im 9/36  brain]
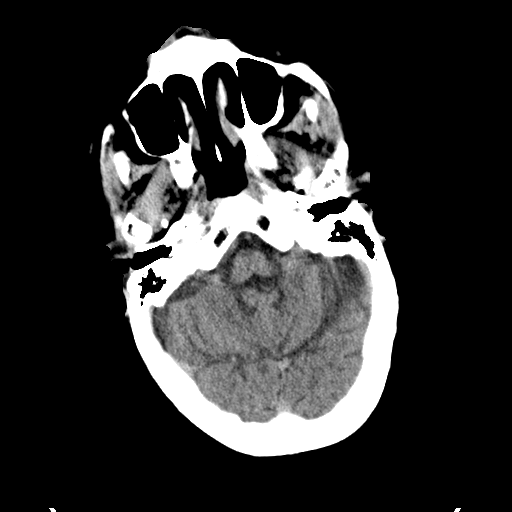
[im 14/36  brain]
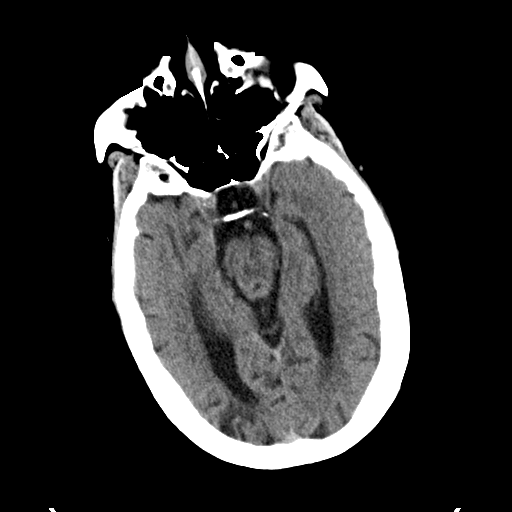
[im 18/36  brain]
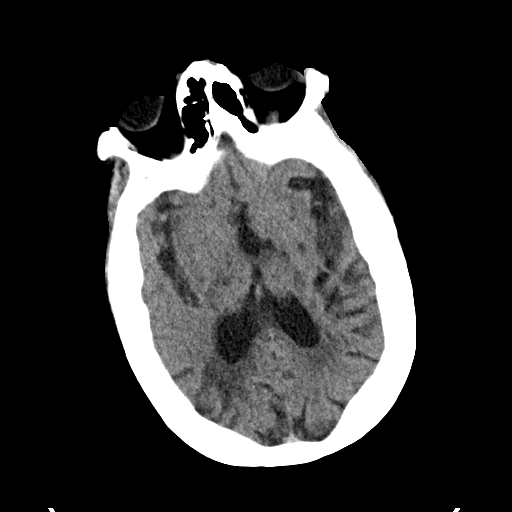
[im 22/36  brain]
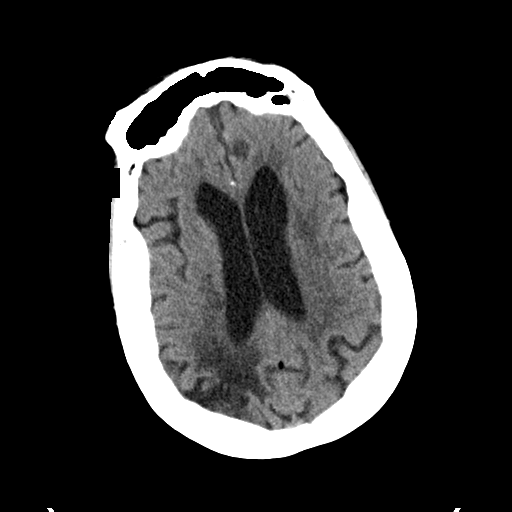
[im 22/36  bone]
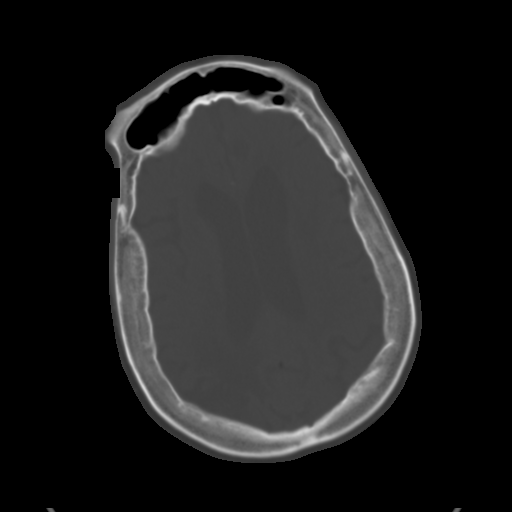
[im 27/36  brain]
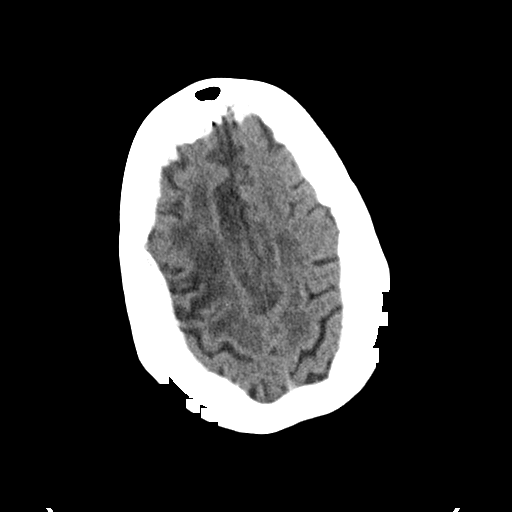
[im 31/36  brain]
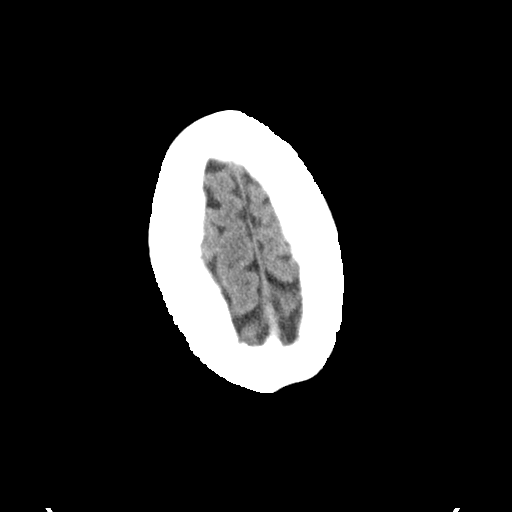

[Series 6: head 3.0 sag st · sagittal · 0.35mm/px · 3 of 54 slices shown]
[im 18/54  brain]
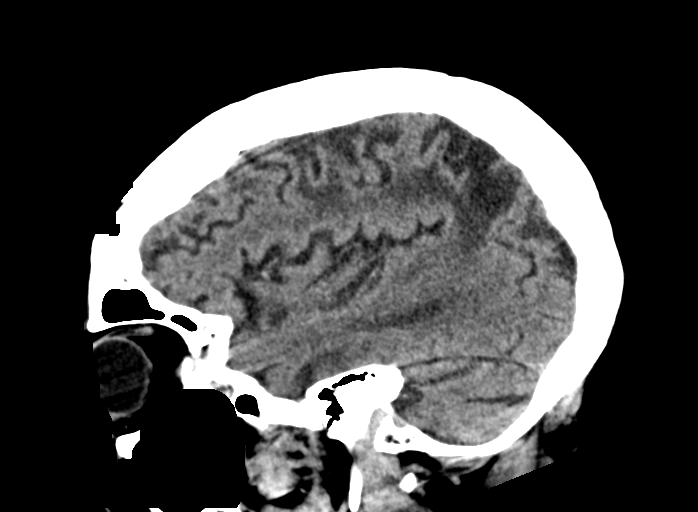
[im 27/54  brain]
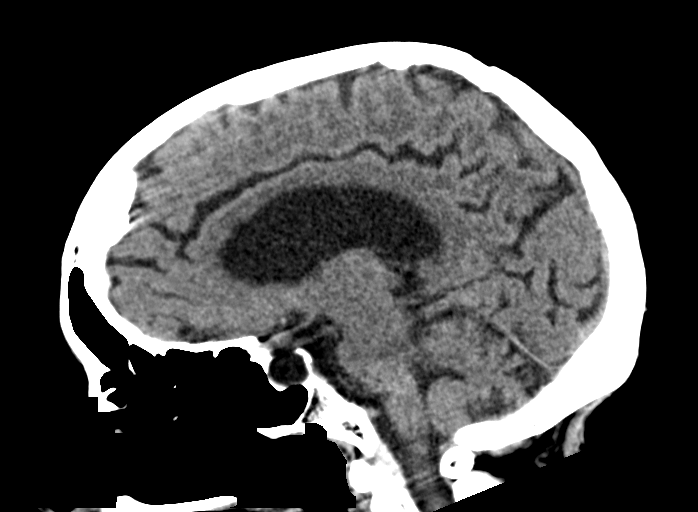
[im 36/54  brain]
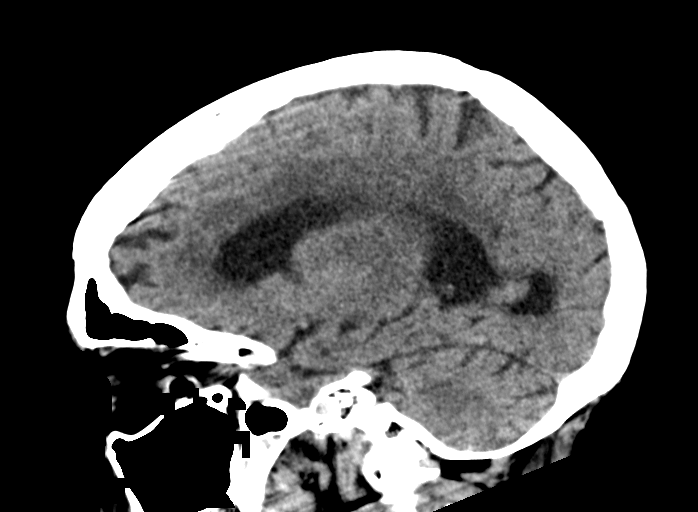

[Series 7: head 3.0 cor st 1 · coronal · 0.34mm/px · 3 of 77 slices shown]
[im 31/77  brain]
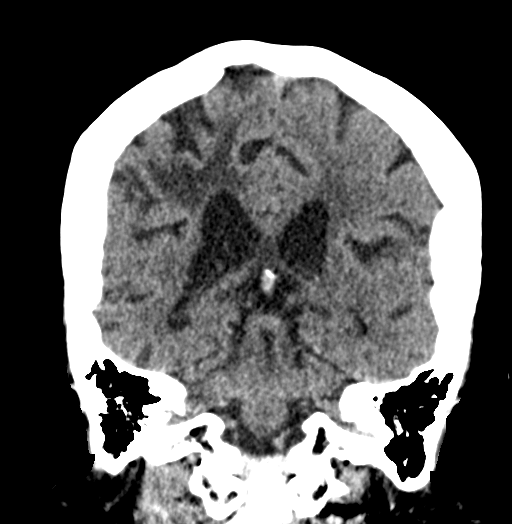
[im 36/77  brain]
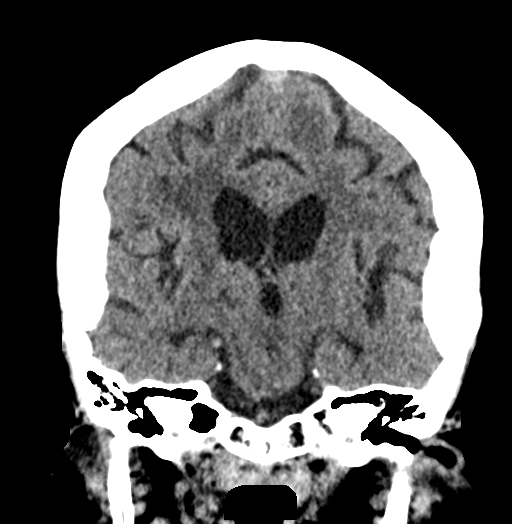
[im 41/77  brain]
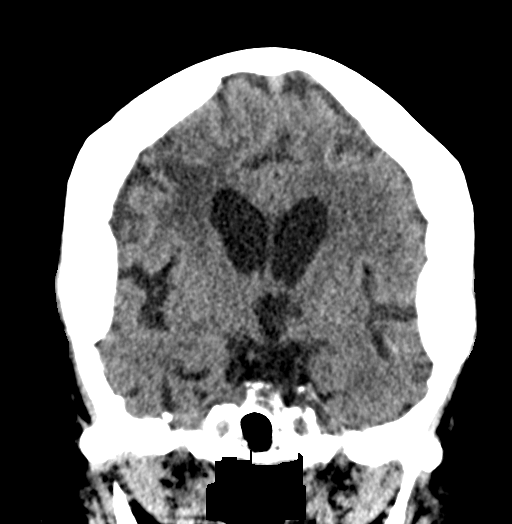

[16 of 47 positions shown; findings below may reference images not displayed]

FINDINGS: Brain: Note degree of motion artifact. Mild diffuse atrophy is
stable. There is no intracranial mass, hemorrhage, extra-axial fluid
collection, or midline shift. Evidence of prior infarct at the right
parieto-occipital junction is stable. There are lacunar type
infarcts in the left lentiform nucleus as well as in each thalamus.
There is decreased attenuation throughout portions of the centra
semiovale bilaterally, stable. There is evidence of a prior infarct
in the posterosuperior right frontal lobe. No appreciable acute
infarct evident.

Vascular: No hyperdense vessels evident. Calcification noted in each
carotid siphon region.

Skull: Bony calvarium appears intact.

Sinuses/Orbits: Paranasal sinuses clear. Orbits symmetric
bilaterally.

Other: Mastoid air cells clear.
IMPRESSION: There is a degree of motion artifact making this study less than
optimal. Stable atrophy with prior infarcts at several sites,
primarily on the right. There is periventricular small vessel
disease. No acute infarct is demonstrated on this study. No mass or
hemorrhage.

Foci of arterial vascular calcification.

## 2021-05-30 IMAGING — MR MR HEAD W/O CM
9 of 10 series · 37 of 48 positions shown · non-contrast
Comparison: Prior head CT examinations [DATE] and earlier.
Brain MRI [DATE].

CLINICAL DATA: Neuro deficit, acute, stroke suspected.

EXAM:
MRI HEAD WITHOUT CONTRAST
TECHNIQUE: Multiplanar, multiecho pulse sequences of the brain and surrounding
structures were obtained without intravenous contrast.

[Series 3: DWI · axial · 3.0mm · 1.09mm/px · z∈[-54,+107]mm · 11 of 110 slices shown (1 of 4)]
[im 1/110]
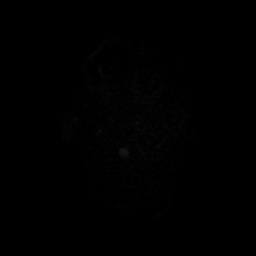
[im 11/110]
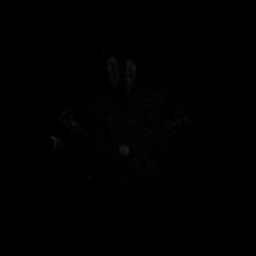
[im 22/110]
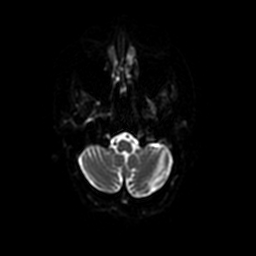
[im 33/110]
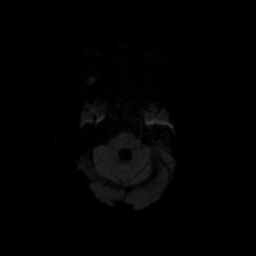
[im 44/110]
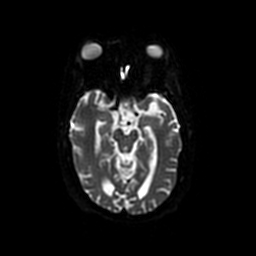
[im 55/110]
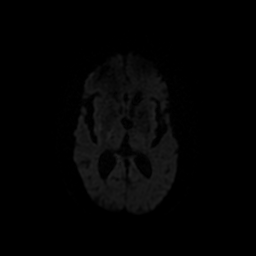
[im 66/110]
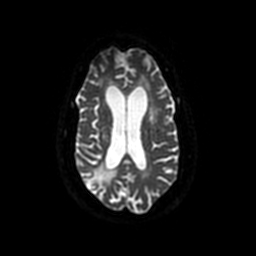
[im 77/110]
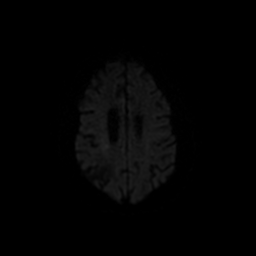
[im 88/110]
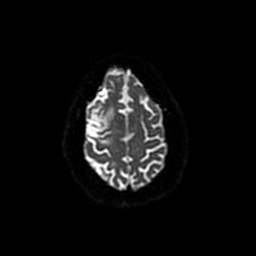
[im 99/110]
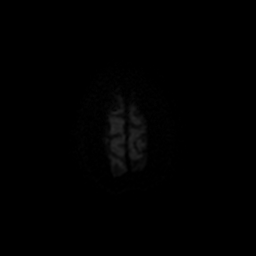
[im 110/110]
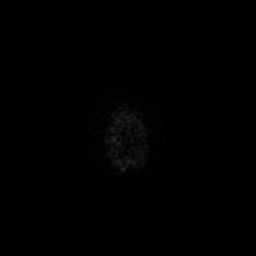

[Series 4: DWI · coronal · 5.0mm · 1.09mm/px · 8 of 76 slices shown (2 of 4)]
[im 1/76]
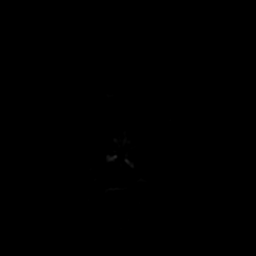
[im 11/76]
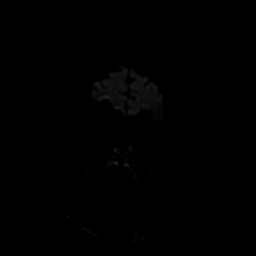
[im 22/76]
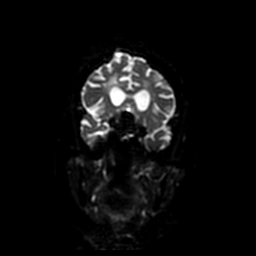
[im 33/76]
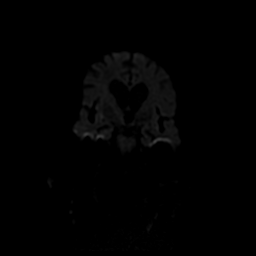
[im 43/76]
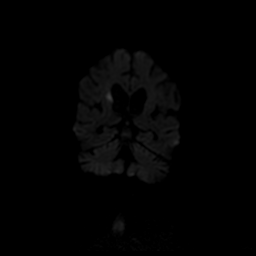
[im 54/76]
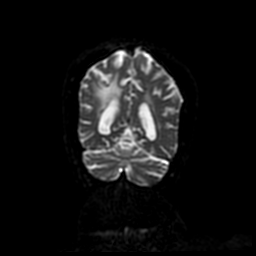
[im 65/76]
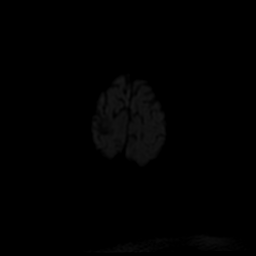
[im 76/76]
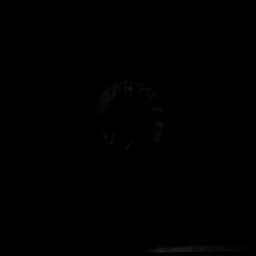

[Series 5: FLAIR · axial · 3.0mm · 0.47mm/px · z∈[-45,+103]mm · 2 of 26 slices shown]
[im 1/26]
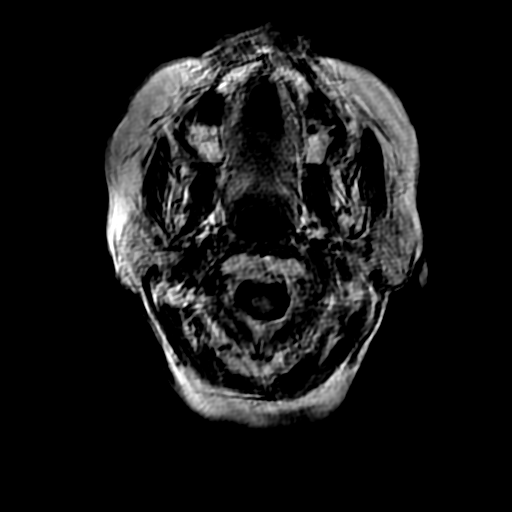
[im 26/26]
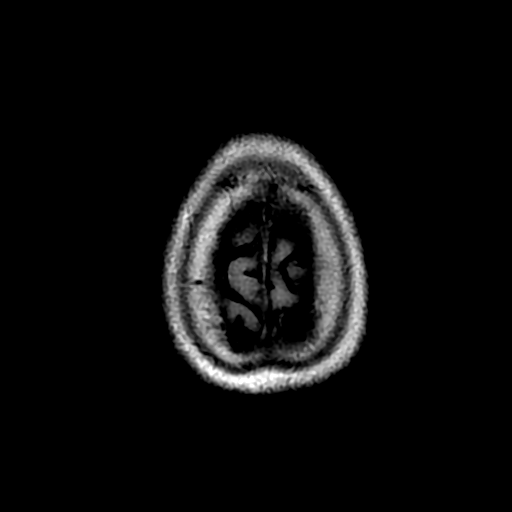

[Series 7: T2 · axial · 5.0mm · 0.47mm/px · z∈[-45,+103]mm · 2 of 26 slices shown (1 of 2)]
[im 1/26]
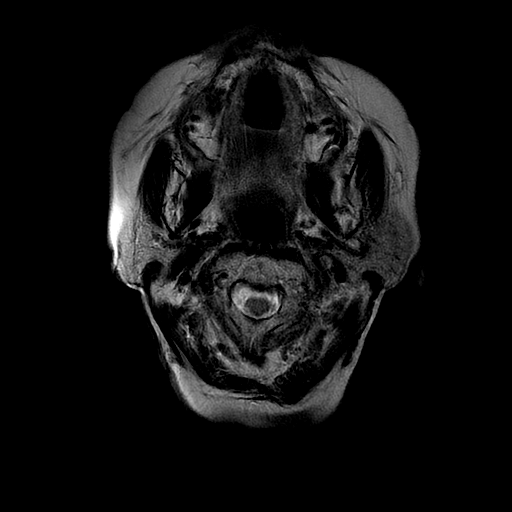
[im 26/26]
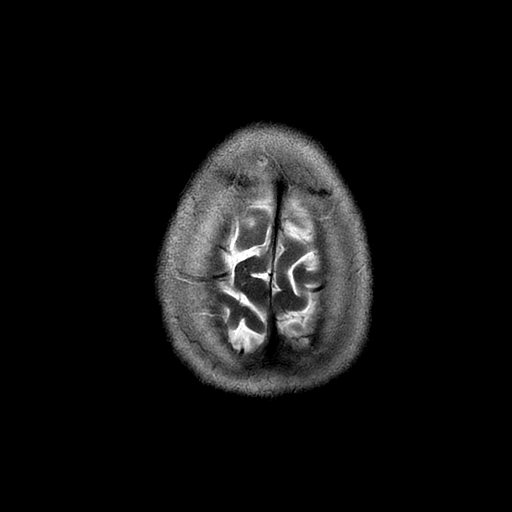

[Series 8: T1 · sagittal · 5.0mm · 0.47mm/px · 2 of 23 slices shown (1 of 2)]
[im 1/23]
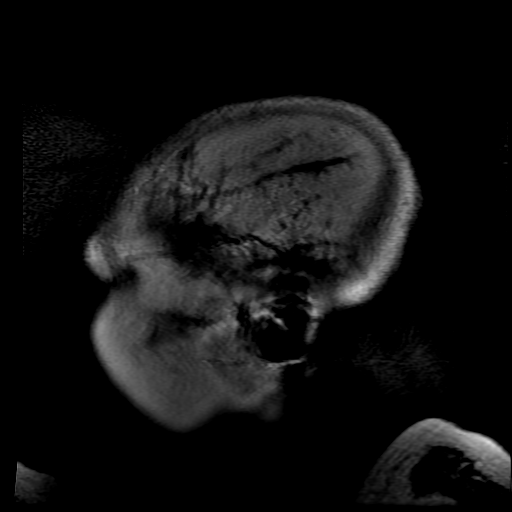
[im 23/23]
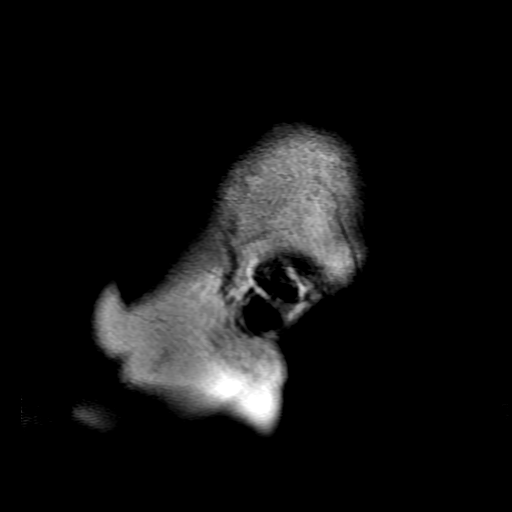

[Series 9: T1 · axial · 3.0mm · 0.47mm/px · 1 of 104 slices shown (2 of 2)]
[im 1/104]
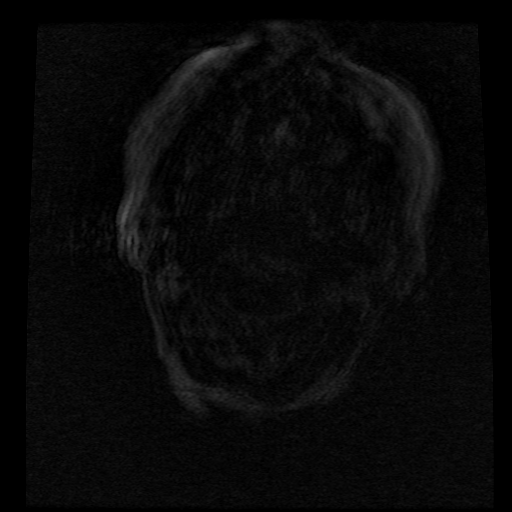

[Series 10: T2 · coronal · 5.0mm · 0.43mm/px · 2 of 24 slices shown (2 of 2)]
[im 1/24]
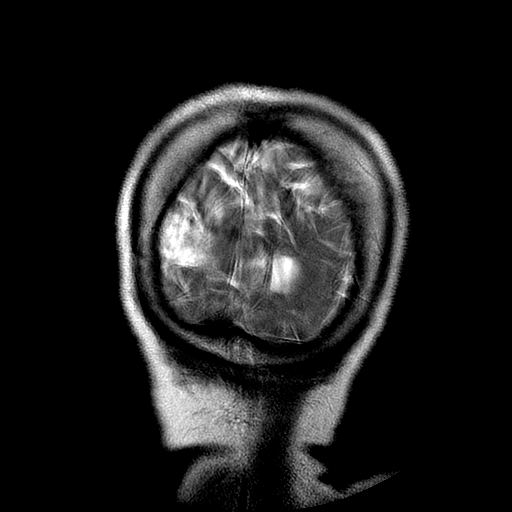
[im 24/24]
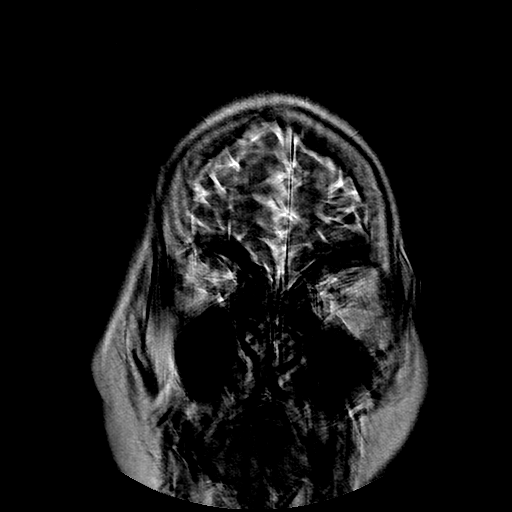

[Series 300: DWI · axial · 3.0mm · 1.09mm/px · z∈[-54,+107]mm · 5 of 55 slices shown (3 of 4)]
[im 1/55]
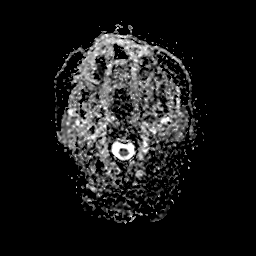
[im 14/55]
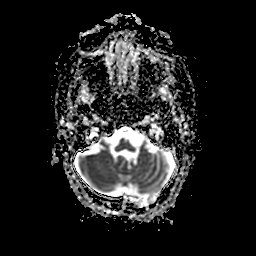
[im 28/55]
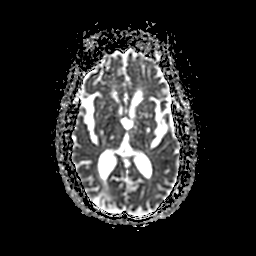
[im 41/55]
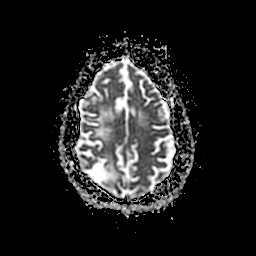
[im 55/55]
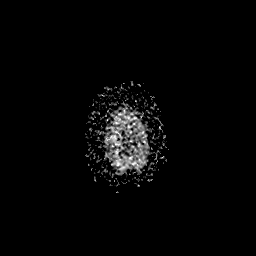

[Series 400: DWI · coronal · 5.0mm · 1.09mm/px · 4 of 38 slices shown (4 of 4)]
[im 1/38]
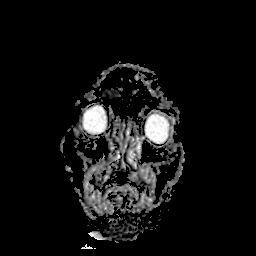
[im 13/38]
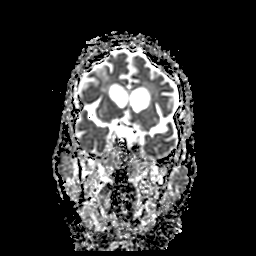
[im 25/38]
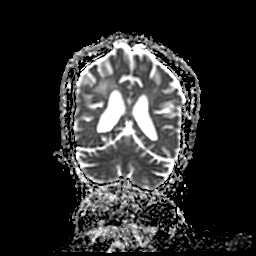
[im 38/38]
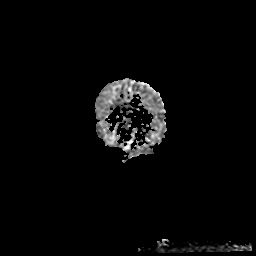

[37 of 48 positions shown; findings below may reference images not displayed]

FINDINGS: Brain:

Intermittently motion degraded examination, limiting evaluation.
Most notably, there is severe motion degradation of the axial T1
weighted sequence, severe motion degradation of the sagittal T1
weighted sequence, moderate motion degradation of the axial T2 FLAIR
sequence and severe motion degradation of the coronal T2 weighted
sequence.

Mild generalized cerebral and cerebellar atrophy.

The diffusion-weighted imaging is of good quality.

Acute/early subacute infarction changes within the right corona
radiata have increased in size as compared to the prior brain MRI of
[DATE], now spanning a region measuring 2.3 x 1.0 x 2.2 cm (AP x
TV x CC). Additionally, there is a new subcentimeter acute infarct
within the right external capsule (series 3, image 26).

Redemonstrated chronic cortical/subcortical infarcts within the
right frontal, parietal and occipital lobes.

Redemonstrated chronic lacunar infarcts within the right cerebral
hemispheric white matter, bilateral basal ganglia and bilateral
thalami.

Background moderate multifocal T2/FLAIR hyperintensity within the
cerebral white matter, nonspecific but compatible with chronic small
vessel ischemic disease.

Tiny chronic lacunar infarct within the inferior left cerebellar
hemisphere.

No evidence of an intracranial mass.

No chronic intracranial blood products.

No extra-axial fluid collection.

No midline shift.

Vascular: Expected proximal arterial flow voids.

Skull and upper cervical spine: No focal marrow lesion is identified
within the limitations of motion degradation.

Sinuses/Orbits: Visualized orbits show no acute finding. No
significant paranasal sinus disease.
IMPRESSION: Motion degraded examination, as described and limiting evaluation.

Acute/early subacute infarction changes within the right corona
radiata have increased in size from the brain MRI of [DATE], now
measuring 2.3 x 1.0 x 2.2 cm. Additionally, there is a new
subcentimeter acute infarct within the right external capsule.

Chronic cortical/subcortical infarcts within the right cerebral
hemisphere, multiple chronic lacunar infarcts, background cerebral
white matter chronic small vessel ischemic disease and generalized
parenchymal atrophy, as detailed.

## 2021-05-30 IMAGING — DX DG CHEST 1V PORT
1 series · 1 of 1 positions shown · non-contrast
Comparison: Portable chest [DATE].

CLINICAL DATA: 69-year-old female with left side weakness and
aphasia. Cough.

EXAM:
PORTABLE CHEST 1 VIEW

[chest ap]
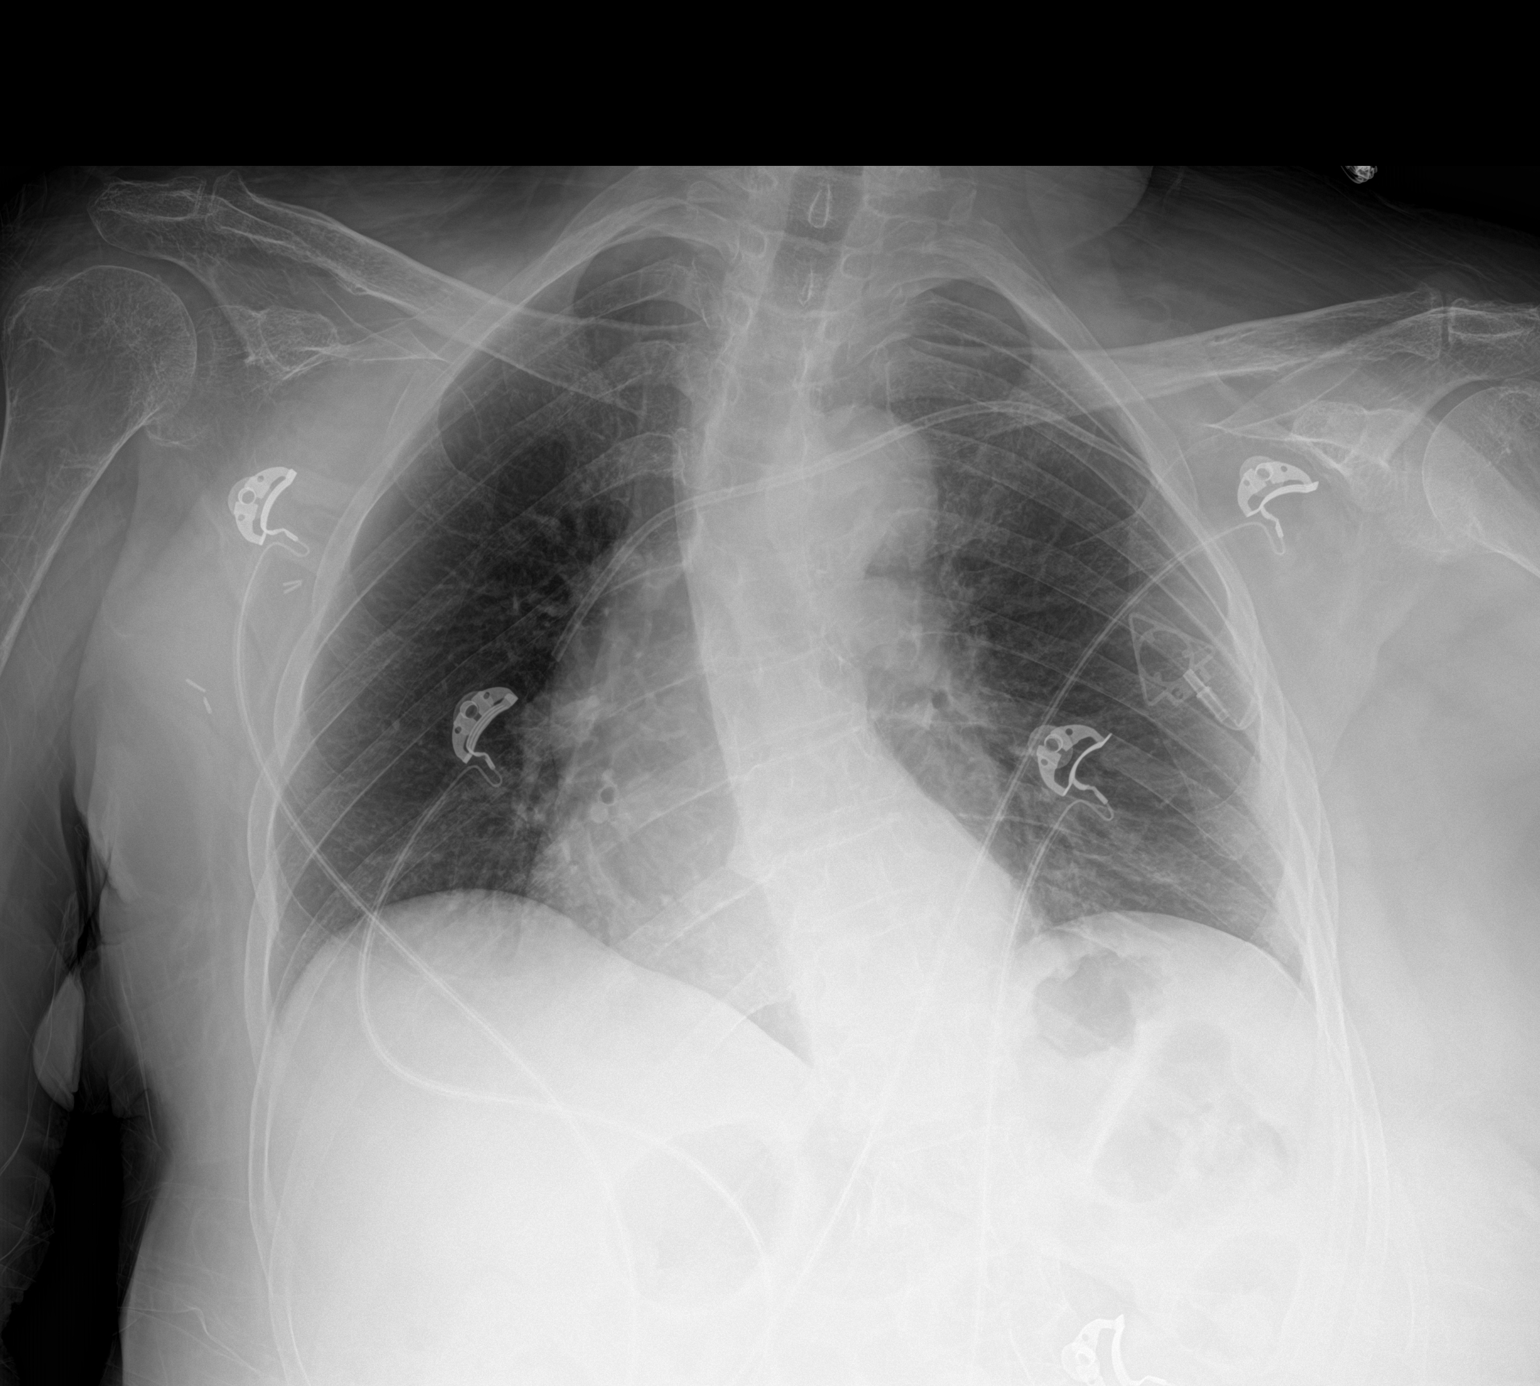

[1 of 1 positions shown; findings below may reference images not displayed]

FINDINGS: Portable AP semi upright view at [MX] hours. Stable left chest
Port-A-Cath. Mildly improved lung volumes. Mediastinal contours are
stable and within normal limits. Thoracolumbar scoliosis. Allowing
for portable technique the lungs are clear. Stable right axillary or
chest wall surgical clips. No acute osseous abnormality identified.
Negative visible bowel gas pattern.
IMPRESSION: No acute cardiopulmonary abnormality.

## 2021-05-30 MED ORDER — LEVETIRACETAM IN NACL 500 MG/100ML IV SOLN
500.0000 mg | Freq: Two times a day (BID) | INTRAVENOUS | Status: DC
  Administered 2021-05-30 – 2021-05-31 (×3): 500 mg via INTRAVENOUS
  Filled 2021-05-30 (×4): qty 100

## 2021-05-30 MED ORDER — ASPIRIN EC 81 MG PO TBEC: 81.0000 mg | DELAYED_RELEASE_TABLET | Freq: Every day | ORAL | Status: DC

## 2021-05-30 MED ORDER — MIRTAZAPINE 15 MG PO TABS
7.5000 mg | ORAL_TABLET | Freq: Every day | ORAL | Status: DC
  Filled 2021-05-30 (×2): qty 1

## 2021-05-30 MED ORDER — ACETAMINOPHEN 325 MG PO TABS: 650.0000 mg | ORAL_TABLET | ORAL | Status: DC | PRN

## 2021-05-30 MED ORDER — ACETAMINOPHEN 160 MG/5ML PO SOLN: 650.0000 mg | ORAL | Status: DC | PRN

## 2021-05-30 MED ORDER — ASPIRIN 300 MG RE SUPP
300.0000 mg | Freq: Every day | RECTAL | Status: DC
  Administered 2021-05-30: 300 mg via RECTAL
  Filled 2021-05-30: qty 1

## 2021-05-30 MED ORDER — SODIUM CHLORIDE 0.9 % IV BOLUS
500.0000 mL | Freq: Once | INTRAVENOUS | Status: AC
  Administered 2021-05-30: 500 mL via INTRAVENOUS

## 2021-05-30 MED ORDER — STROKE: EARLY STAGES OF RECOVERY BOOK
Freq: Once | Status: DC
  Filled 2021-05-30: qty 1

## 2021-05-30 MED ORDER — ALBUTEROL SULFATE (2.5 MG/3ML) 0.083% IN NEBU: 3.0000 mL | INHALATION_SOLUTION | Freq: Two times a day (BID) | RESPIRATORY_TRACT | Status: DC | PRN

## 2021-05-30 MED ORDER — ATORVASTATIN CALCIUM 40 MG PO TABS
40.0000 mg | ORAL_TABLET | Freq: Every day | ORAL | Status: DC
  Filled 2021-05-30: qty 1

## 2021-05-30 MED ORDER — SODIUM CHLORIDE 0.9% FLUSH
3.0000 mL | Freq: Once | INTRAVENOUS | Status: AC
Start: 2021-05-30 — End: 2021-05-30
  Administered 2021-05-30: 3 mL via INTRAVENOUS

## 2021-05-30 MED ORDER — ENOXAPARIN SODIUM 40 MG/0.4ML IJ SOSY
40.0000 mg | PREFILLED_SYRINGE | INTRAMUSCULAR | Status: DC
  Administered 2021-05-30 – 2021-06-02 (×4): 40 mg via SUBCUTANEOUS
  Filled 2021-05-30 (×4): qty 0.4

## 2021-05-30 MED ORDER — CLOPIDOGREL BISULFATE 75 MG PO TABS: 75.0000 mg | ORAL_TABLET | Freq: Every day | ORAL | Status: DC

## 2021-05-30 MED ORDER — ACETAMINOPHEN 650 MG RE SUPP
650.0000 mg | RECTAL | Status: DC | PRN
  Administered 2021-06-02: 650 mg via RECTAL
  Filled 2021-05-30: qty 1

## 2021-05-30 MED ORDER — SODIUM CHLORIDE 0.9 % IV SOLN: INTRAVENOUS | Status: DC

## 2021-05-30 MED ORDER — MELATONIN 3 MG PO TABS: 3.0000 mg | ORAL_TABLET | Freq: Every evening | ORAL | Status: DC | PRN

## 2021-05-30 NOTE — H&P (Signed)
History and Physical    Brandi Stewart Mault JYN:[id number] DOB: 10/26/1951 DOA: 05/30/2021  PCP: Pcp, No Consultants:  neurology: Dr. Arletha Grippeedman Patient [facility]: [facility]   Chief Complaint: left sided weakness/acutely non verbal.   History from chart/EMS note as patient is acutely non verbal. Also talked to son, ryan Rucinski.   HPI: Brandi Stewart Chuck is a 70 y.o. 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 Code stroke was called. She was able to communicate with family last night, but this AM was acutely non verbal and EMS was called. She has been on ASA and clopidogrel for stroke prophylaxis. Son also state she was able to feed herself last night, but her fine motor skills were not as good as they have been.   She was just admitted to [facility] 6/23 for acute worsening of right sided weakness and aphasia and found to have an acute subcortical infarct. Neurology followed along and felt this was unrelated to presenting complaints. [facility] multiple previous strokes as well. In November 2021 she had an acute left thalamoscapuslar junction and left basal ganglia infarct and imaging showed a chronic right MCA territory infarct. EEGs were also obtained without evidence of seizures, but she was placed on Keppra for seizure prophylaxis. She continues to be on this.     ED Course: bp: 109/73, HR: 96, RR: 21, oxygen: 97% on RA, afebrile. Code stroke. CTH without acute infarct. Labs with creatinine of 1.08, UA: 80 ketones, lactic acid: 2.6, CXR with no acute findings. Neurology consulted and we were asked to admit for stroke like symptoms.   Review of Systems: unable to perform. Patient is nonverbal.   Ambulatory Status: wheelchair/bed bound      Past Medical History:  Diagnosis Date    Cognitive communication deficit    CVA (cerebral vascular accident) (HCC)    Dyslipidemia    Dysphagia following unspecified cerebrovascular disease    Dysphagia, oropharyngeal phase    Essential hypertension    Hemiplegia, unspecified affecting right dominant side (HCC)    Muscle weakness (generalized)    Other abnormalities of gait and mobility    Personal history of malignant neoplasm of breast     No past surgical history on file.  Social History   Socioeconomic History   Marital status: Single    Spouse name: Not on file   Number of children: Not on file   Years of education: Not on file   Highest education level: Not on file  Occupational History   Occupation: retired  Tobacco Use   Smoking status: Never   Smokeless tobacco: Never  Substance and Sexual Activity   Alcohol use: Not Currently   Drug use: Not Currently   Sexual activity: Not on file  Other Topics Concern   Not on file  Social History Narrative   Not on file   Social Determinants of Health   Financial Resource Strain: Not on file  Food Insecurity: Not on file  Transportation Needs: Not on file  Physical Activity: Not on file  Stress: Not on file  Social Connections: Not on file  Intimate Partner Violence: Not on file    Allergies  Allergen Reactions   Other     Statins-HMG-coA- Reductase Inhibitors  Per MAR   Statins  Other (See Comments)    Not on MAR    No family history on file.  Prior to Admission medications   Medication Sig Start Date End Date Taking? Authorizing Provider  albuterol (VENTOLIN HFA) 108 (90 Base) MCG/ACT inhaler Inhale 2 puffs into the lungs 2 (two) times daily as needed for wheezing or shortness of breath. 10/26/20  Yes [provider]  atorvastatin (LIPITOR) 40 MG tablet Take 40 mg by mouth at bedtime. 05/17/21  Yes [provider]  levETIRAcetam (KEPPRA) 500 MG tablet Take 500 mg by mouth 2 (two) times daily. 05/09/21  Yes [provider]  melatonin 3 MG TABS tablet Take 3 mg by mouth at bedtime as needed (sleep).   Yes [provider]  mirtazapine (REMERON) 7.5 MG tablet Take 7.5 mg by mouth at bedtime. 05/12/21  Yes [provider]  aspirin EC 81 MG EC tablet Take 1 tablet (81 mg total) by mouth daily for 20 days. Swallow whole. 05/28/21 06/17/21  Marzetta Board, MD  clopidogrel (PLAVIX) 75 MG tablet Take 75 mg by mouth daily. 05/17/21   [provider]  telmisartan (MICARDIS) 40 MG tablet Take 40 mg by mouth daily. 05/22/21   [provider]    Physical Exam: Vitals:   05/30/21 1030 05/30/21 1100 05/30/21 1205 05/30/21 1230  BP: 128/71 106/71 (!) 119/91 103/74  Pulse: 99 92 94 95  Resp: 20 17 18 16   Temp:      TempSrc:      SpO2: 98% 95% 96% 94%  Weight:      Height:         General:  Appears calm and comfortable and is in NAD. Does nod yes or no appropriately to questions.  Eyes:  PERRL, EOMI, normal lids, iris. Looking to the right, but able to direct gaze to the left on command.  ENT:  grossly normal hearing, lips & tongue, mmm; appropriate dentition Neck:  no LAD, masses or thyromegaly; no carotid bruits Cardiovascular:  RRR, no m/r/g. No LE edema.  Respiratory:   CTA bilaterally with no wheezes/rales/rhonchi.  Normal respiratory effort. Abdomen:  soft, NT, ND, NABS Back:    no CVAT Skin:  no rash or induration seen on limited exam Musculoskeletal:  right UE/LE at baseline. Basically hemiparesis with no movement. LUE and LLE without effort against gravity. Can weakly close her left hand and can wiggle her left toes.  Lower extremity:  no LE edema.  Limited foot exam with no ulcerations.  2+ distal pulses. Psychiatric:  unable to assess as she is nonverbal.  Neurologic:  no facial droop, hearing intact. II, III, IV, VI: intact. Can not assess ix/x, xii.     Radiological Exams on Admission: Independently reviewed - see discussion in A/P where applicable  CT  HEAD WO CONTRAST  Result Date: 05/30/2021 CLINICAL DATA:  Seizure EXAM: CT HEAD WITHOUT CONTRAST TECHNIQUE: Contiguous axial images were obtained from the base of the skull through the vertex without intravenous contrast. COMPARISON:  May 25, 2021 head CT and brain MRI FINDINGS: Brain: Note degree of motion artifact. Mild diffuse atrophy is stable. There is no intracranial mass, hemorrhage, extra-axial fluid collection, or midline shift. Evidence of prior infarct at the right parieto-occipital junction is stable. There are lacunar type infarcts in the left lentiform nucleus as well as in each thalamus. There is decreased attenuation throughout portions of the centra semiovale bilaterally, stable. There is evidence of a prior infarct in the posterosuperior right frontal  lobe. No appreciable acute infarct evident. Vascular: No hyperdense vessels evident. Calcification noted in each carotid siphon region. Skull: Bony calvarium appears intact. Sinuses/Orbits: Paranasal sinuses clear. Orbits symmetric bilaterally. Other: Mastoid air cells clear. IMPRESSION: There is a degree of motion artifact making this study less than optimal. Stable atrophy with prior infarcts at several sites, primarily on the right. There is periventricular small vessel disease. No acute infarct is demonstrated on this study. No mass or hemorrhage. Foci of arterial vascular calcification. Electronically Signed   By: Bretta Bang III M.D.   On: 05/30/2021 09:49   MR BRAIN WO CONTRAST  Result Date: 05/30/2021 CLINICAL DATA:  Neuro deficit, acute, stroke suspected. EXAM: MRI HEAD WITHOUT CONTRAST TECHNIQUE: Multiplanar, multiecho pulse sequences of the brain and surrounding structures were obtained without intravenous contrast. COMPARISON:  Prior head CT examinations 05/30/2021 and earlier. Brain MRI 05/25/2021. FINDINGS: Brain: Intermittently motion degraded examination, limiting evaluation. Most notably, there is severe motion  degradation of the axial T1 weighted sequence, severe motion degradation of the sagittal T1 weighted sequence, moderate motion degradation of the axial T2 FLAIR sequence and severe motion degradation of the coronal T2 weighted sequence. Mild generalized cerebral and cerebellar atrophy. The diffusion-weighted imaging is of good quality. Acute/early subacute infarction changes within the right corona radiata have increased in size as compared to the prior brain MRI of 05/25/2021, now spanning a region measuring 2.3 x 1.0 x 2.2 cm (AP x TV x CC). Additionally, there is a new subcentimeter acute infarct within the right external capsule (series 3, image 26). Redemonstrated chronic cortical/subcortical infarcts within the right frontal, parietal and occipital lobes. Redemonstrated chronic lacunar infarcts within the right cerebral hemispheric white matter, bilateral basal ganglia and bilateral thalami. Background moderate multifocal T2/FLAIR hyperintensity within the cerebral white matter, nonspecific but compatible with chronic small vessel ischemic disease. Tiny chronic lacunar infarct within the inferior left cerebellar hemisphere. No evidence of an intracranial mass. No chronic intracranial blood products. No extra-axial fluid collection. No midline shift. Vascular: Expected proximal arterial flow voids. Skull and upper cervical spine: No focal marrow lesion is identified within the limitations of motion degradation. Sinuses/Orbits: Visualized orbits show no acute finding. No significant paranasal sinus disease. IMPRESSION: Motion degraded examination, as described and limiting evaluation. Acute/early subacute infarction changes within the right corona radiata have increased in size from the brain MRI of 05/25/2021, now measuring 2.3 x 1.0 x 2.2 cm. Additionally, there is a new subcentimeter acute infarct within the right external capsule. Chronic cortical/subcortical infarcts within the right cerebral hemisphere,  multiple chronic lacunar infarcts, background cerebral white matter chronic small vessel ischemic disease and generalized parenchymal atrophy, as detailed. Electronically Signed   By: Jackey Loge DO   On: 05/30/2021 14:20   DG Chest Portable 1 View  Result Date: 05/30/2021 CLINICAL DATA:  70 year old female with left side weakness and aphasia. Cough. EXAM: PORTABLE CHEST 1 VIEW COMPARISON:  Portable chest 05/25/2021. FINDINGS: Portable AP semi upright view at 1000 hours. Stable left chest Port-A-Cath. Mildly improved lung volumes. Mediastinal contours are stable and within normal limits. Thoracolumbar scoliosis. Allowing for portable technique the lungs are clear. Stable right axillary or chest wall surgical clips. No acute osseous abnormality identified. Negative visible bowel gas pattern. IMPRESSION: No acute cardiopulmonary abnormality. Electronically Signed   By: Odessa Fleming M.D.   On: 05/30/2021 10:10    EKG: Independently reviewed.  Sinus tachy with rate 103; nonspecific ST changes with no evidence of acute ischemia. Faster than pervious tracings.  Labs on Admission: I have personally reviewed the available labs and imaging studies at the time of the admission.  Pertinent labs:  Creatinine: 1.08 Ua: 80 ketones LDL: 134 CTH: no acute infarct.   Assessment/Plan Principal Problem:   Acute left hemiparesis [facility]) -neurology consulted and following. Possible expansion of the right corona radiata stroke from a week ago. Does not appear that facility has started her plavix or ASA per [facility] since last d/c.  -frequent neuro checks and telemetry  -neuro has ordered routine EEG and MRI brain to r/o stroke expansion.  -NPO until ST assessment -no acute stroke w/u as done within past week -continue keppra BID as well as ASA/plavix. Keppra IV Bid until passes swallow screen.  -ST for swallow eval/speech, OT/PT  Active Problems:   History of CVA with residual deficit -hx of past multiple  CVAs -continue plavix/ASA -continued her keppra IV -tele -modify risk factors.  -lipid panel: LDL at 134. On lipitor 40mg  daily. Goal LDL <70 -consider change to crestor or addition of zetia.      Essential hypertension -continue home medication tomorrow if passes swallow screen.  Allow for permissive HTN first 24 hours.   -hydralazine discontinued on last admit and micardis was started.    Dyslipidemia -continue home meds if passes swallow screen -pid panel: LDL at 134. On lipitor 40mg  daily. Goal LDL <70 -consider change to crestor or addition of zetia.   Prediabetes A1c of 6.2, diet controlled.   CHF, diastolic grade 1 -daily weights -I/Os -does not appear volume overloaded. Continue to monitor clinically -echo on recent admit 6/24: EF of 55-60%, mold VH. Grade 1 diastolic dysfunction.   Ketonuria -light IVF hydration x 12 hours   Elevated lactic acid -likely secondary to dehydration.  -IVF, repeat LA.   Body mass index is 27.43 kg/m.    Level of care: Telemetry Medical DVT prophylaxis:  Lovenox  Code Status:  Full - confirmed with family Family Communication: Son, Analyn Matusek Disposition Plan:  The patient is from: SNF  Anticipated d/c is to: SNF after neurology work up complete including EEG/imaging and medical management.  Consults: Neurology in ER Admission status:  observation    7/24 MD [facility]   How to contact the [facility] Attending or Consulting provider 7A - 7P or covering provider during after hours 7P -7A, for this patient?  Check the care team in [facility] and look for a) attending/consulting [facility] provider listed and b) the [facility] team listed Log into [email or web address] and use [place]'s universal password to access. If you do not have the password, please contact the hospital operator. Locate the [facility] provider you are looking for under [facility] and page to a number that you can be directly reached. If you still have difficulty  reaching the provider, please page the [facility] (Director on Call) for the Hospitalists listed on amion for assistance.   05/30/2021, 2:36 PM

## 2021-05-30 NOTE — Progress Notes (Signed)
Will get EEG in morning , patient is not available at the moment, asleep.

## 2021-05-30 NOTE — ED Notes (Signed)
Patient transported to MRI 

## 2021-05-30 NOTE — ED Provider Notes (Signed)
[facility] EMERGENCY DEPARTMENT Provider Note   CSN: [id number] Arrival date & time: 05/30/21  6720  LEVEL 5 CAVEAT - NONVERBAL  History Chief Complaint  Patient presents with   Code Stroke    Brandi Stewart is a 70 y.o. female.  HPI 70 year old female presents via EMS from nursing home with acute strokelike symptoms.  She has chronic right-sided weakness but developed left-sided weakness and is now nonverbal.  History is thus limited.  Last seen normal by family around 5:15 PM yesterday evening.  Past Medical History:  Diagnosis Date   Cognitive communication deficit    CVA (cerebral vascular accident) (HCC)    Dyslipidemia    Dysphagia following unspecified cerebrovascular disease    Dysphagia, oropharyngeal phase    Essential hypertension    Hemiplegia, unspecified affecting right dominant side (HCC)    Muscle weakness (generalized)    Other abnormalities of gait and mobility    Personal history of malignant neoplasm of breast     Patient Active Problem List   Diagnosis Date Noted   Acute left hemiparesis (HCC) 05/30/2021   History of CVA with residual deficit 05/30/2021   Prediabetes 05/30/2021   Acute left-sided weakness 05/30/2021   Essential hypertension 05/25/2021   Dyslipidemia 05/25/2021    No past surgical history on file.   OB History   No obstetric history on file.     No family history on file.  Social History   Tobacco Use   Smoking status: Never   Smokeless tobacco: Never  Substance Use Topics   Alcohol use: Not Currently   Drug use: Not Currently    Home Medications Prior to Admission medications   Medication Sig Start Date End Date Taking? Authorizing Provider  albuterol (VENTOLIN HFA) 108 (90 Base) MCG/ACT inhaler Inhale 2 puffs into the lungs 2 (two) times daily as needed for wheezing or shortness of breath. 10/26/20  Yes [provider]  atorvastatin (LIPITOR) 40 MG tablet Take 40 mg by mouth at bedtime.  05/17/21  Yes [provider]  levETIRAcetam (KEPPRA) 500 MG tablet Take 500 mg by mouth 2 (two) times daily. 05/09/21  Yes [provider]  melatonin 3 MG TABS tablet Take 3 mg by mouth at bedtime as needed (sleep).   Yes [provider]  mirtazapine (REMERON) 7.5 MG tablet Take 7.5 mg by mouth at bedtime. 05/12/21  Yes [provider]  aspirin EC 81 MG EC tablet Take 1 tablet (81 mg total) by mouth daily for 20 days. Swallow whole. 05/28/21 06/17/21  Marzetta Board, MD  clopidogrel (PLAVIX) 75 MG tablet Take 75 mg by mouth daily. 05/17/21   [provider]  telmisartan (MICARDIS) 40 MG tablet Take 40 mg by mouth daily. 05/22/21   [provider]    Allergies    Other and Statins  Review of Systems   Review of Systems  Unable to perform ROS: Patient nonverbal   Physical Exam Updated Vital Signs BP (!) 165/97   Pulse (!) 109   Temp 99.3 F (37.4 C) (Rectal)   Resp (!) 21   Ht 5\' 6"  (1.676 m)   Wt 77.1 kg   SpO2 97%   BMI 27.43 kg/m   Physical Exam Vitals and nursing note reviewed.  Constitutional:      Appearance: She is well-developed.     Comments: Occasionally appears to try to clear her throat with a wet sounding cough.    HENT:     Head:  Normocephalic and atraumatic.     Right Ear: External ear normal.     Left Ear: External ear normal.     Nose: Nose normal.  Eyes:     General:        Right eye: No discharge.        Left eye: No discharge.  Cardiovascular:     Rate and Rhythm: Normal rate and regular rhythm.     Heart sounds: Normal heart sounds.  Pulmonary:     Effort: Pulmonary effort is normal.     Breath sounds: Normal breath sounds.  Abdominal:     Palpations: Abdomen is soft.     Tenderness: There is no abdominal tenderness.  Skin:    General: Skin is warm and dry.  Neurological:     Mental Status: She is alert.     Comments: Awake, alert, flaccid on right arm and leg.  Quite weak but can grip  in the left hand and wiggle left toes.  No gaze preference.  Psychiatric:        Mood and Affect: Mood is not anxious.    ED Results / Procedures / Treatments   Labs (all labs ordered are listed, but only abnormal results are displayed) Labs Reviewed  COMPREHENSIVE METABOLIC PANEL - Abnormal; Notable for the following components:      Result Value   CO2 18 (*)    Glucose, Bld 122 (*)    Creatinine, Ser 1.08 (*)    Total Bilirubin 1.3 (*)    GFR, Estimated 55 (*)    All other components within normal limits  CBC WITH DIFFERENTIAL/PLATELET - Abnormal; Notable for the following components:   Neutro Abs 8.4 (*)    All other components within normal limits  URINALYSIS, ROUTINE W REFLEX MICROSCOPIC - Abnormal; Notable for the following components:   Color, Urine AMBER (*)    APPearance CLOUDY (*)    Ketones, ur 80 (*)    Protein, ur 30 (*)    Leukocytes,Ua TRACE (*)    Bacteria, UA RARE (*)    All other components within normal limits  LACTIC ACID, PLASMA - Abnormal; Notable for the following components:   Lactic Acid, Venous 2.6 (*)    All other components within normal limits  APTT - Abnormal; Notable for the following components:   aPTT 22 (*)    All other components within normal limits  LIPID PANEL - Abnormal; Notable for the following components:   LDL Cholesterol 134 (*)    All other components within normal limits  I-STAT CHEM 8, ED - Abnormal; Notable for the following components:   BUN 25 (*)    Glucose, Bld 125 (*)    TCO2 20 (*)    All other components within normal limits  RESP PANEL BY RT-PCR (FLU A&B, COVID) [id number]  URINE CULTURE  PROTIME-INR  LACTIC ACID, PLASMA  CBG MONITORING, ED    EKG EKG Interpretation  Date/Time:  Tuesday May 30 2021 09:42:58 EDT Ventricular Rate:  103 PR Interval:  181 QRS Duration: 103 QT Interval:  366 QTC Calculation: 480 R Axis:   -26 Text Interpretation: Sinus tachycardia Abnormal R-wave progression, early transition  LVH with secondary repolarization abnormality rate is faster compared to May 25 2021 Confirmed by Pricilla Loveless [id number]) on 05/30/2021 9:56:34 AM  Radiology CT HEAD WO CONTRAST  Result Date: 05/30/2021 CLINICAL DATA:  Seizure EXAM: CT HEAD WITHOUT CONTRAST TECHNIQUE: Contiguous axial images were obtained from the base of the skull through  the vertex without intravenous contrast. COMPARISON:  May 25, 2021 head CT and brain MRI FINDINGS: Brain: Note degree of motion artifact. Mild diffuse atrophy is stable. There is no intracranial mass, hemorrhage, extra-axial fluid collection, or midline shift. Evidence of prior infarct at the right parieto-occipital junction is stable. There are lacunar type infarcts in the left lentiform nucleus as well as in each thalamus. There is decreased attenuation throughout portions of the centra semiovale bilaterally, stable. There is evidence of a prior infarct in the posterosuperior right frontal lobe. No appreciable acute infarct evident. Vascular: No hyperdense vessels evident. Calcification noted in each carotid siphon region. Skull: Bony calvarium appears intact. Sinuses/Orbits: Paranasal sinuses clear. Orbits symmetric bilaterally. Other: Mastoid air cells clear. IMPRESSION: There is a degree of motion artifact making this study less than optimal. Stable atrophy with prior infarcts at several sites, primarily on the right. There is periventricular small vessel disease. No acute infarct is demonstrated on this study. No mass or hemorrhage. Foci of arterial vascular calcification. Electronically Signed   By: Bretta Bang III M.D.   On: 05/30/2021 09:49   MR BRAIN WO CONTRAST  Result Date: 05/30/2021 CLINICAL DATA:  Neuro deficit, acute, stroke suspected. EXAM: MRI HEAD WITHOUT CONTRAST TECHNIQUE: Multiplanar, multiecho pulse sequences of the brain and surrounding structures were obtained without intravenous contrast. COMPARISON:  Prior head CT examinations 05/30/2021  and earlier. Brain MRI 05/25/2021. FINDINGS: Brain: Intermittently motion degraded examination, limiting evaluation. Most notably, there is severe motion degradation of the axial T1 weighted sequence, severe motion degradation of the sagittal T1 weighted sequence, moderate motion degradation of the axial T2 FLAIR sequence and severe motion degradation of the coronal T2 weighted sequence. Mild generalized cerebral and cerebellar atrophy. The diffusion-weighted imaging is of good quality. Acute/early subacute infarction changes within the right corona radiata have increased in size as compared to the prior brain MRI of 05/25/2021, now spanning a region measuring 2.3 x 1.0 x 2.2 cm (AP x TV x CC). Additionally, there is a new subcentimeter acute infarct within the right external capsule (series 3, image 26). Redemonstrated chronic cortical/subcortical infarcts within the right frontal, parietal and occipital lobes. Redemonstrated chronic lacunar infarcts within the right cerebral hemispheric white matter, bilateral basal ganglia and bilateral thalami. Background moderate multifocal T2/FLAIR hyperintensity within the cerebral white matter, nonspecific but compatible with chronic small vessel ischemic disease. Tiny chronic lacunar infarct within the inferior left cerebellar hemisphere. No evidence of an intracranial mass. No chronic intracranial blood products. No extra-axial fluid collection. No midline shift. Vascular: Expected proximal arterial flow voids. Skull and upper cervical spine: No focal marrow lesion is identified within the limitations of motion degradation. Sinuses/Orbits: Visualized orbits show no acute finding. No significant paranasal sinus disease. IMPRESSION: Motion degraded examination, as described and limiting evaluation. Acute/early subacute infarction changes within the right corona radiata have increased in size from the brain MRI of 05/25/2021, now measuring 2.3 x 1.0 x 2.2 cm. Additionally,  there is a new subcentimeter acute infarct within the right external capsule. Chronic cortical/subcortical infarcts within the right cerebral hemisphere, multiple chronic lacunar infarcts, background cerebral white matter chronic small vessel ischemic disease and generalized parenchymal atrophy, as detailed. Electronically Signed   By: Jackey Loge DO   On: 05/30/2021 14:20   DG Chest Portable 1 View  Result Date: 05/30/2021 CLINICAL DATA:  70 year old female with left side weakness and aphasia. Cough. EXAM: PORTABLE CHEST 1 VIEW COMPARISON:  Portable chest 05/25/2021. FINDINGS: Portable AP semi upright view at  1000 hours. Stable left chest Port-A-Cath. Mildly improved lung volumes. Mediastinal contours are stable and within normal limits. Thoracolumbar scoliosis. Allowing for portable technique the lungs are clear. Stable right axillary or chest wall surgical clips. No acute osseous abnormality identified. Negative visible bowel gas pattern. IMPRESSION: No acute cardiopulmonary abnormality. Electronically Signed   By: Odessa FlemingH  Hall M.D.   On: 05/30/2021 10:10    Procedures Procedures   Medications Ordered in ED Medications  sodium chloride flush (NS) 0.9 % injection 3 mL (has no administration in time range)   stroke: mapping our early stages of recovery book (has no administration in time range)  acetaminophen (TYLENOL) tablet 650 mg (has no administration in time range)    Or  acetaminophen (TYLENOL) 160 MG/5ML solution 650 mg (has no administration in time range)    Or  acetaminophen (TYLENOL) suppository 650 mg (has no administration in time range)  enoxaparin (LOVENOX) injection 40 mg (has no administration in time range)  0.9 %  sodium chloride infusion ( Intravenous New Bag/Given 05/30/21 1454)  levETIRAcetam (KEPPRA) IVPB 500 mg/100 mL premix (500 mg Intravenous New Bag/Given 05/30/21 1549)  sodium chloride 0.9 % bolus 500 mL (0 mLs Intravenous Stopped 05/30/21 1544)    ED Course  I have  reviewed the triage vital signs and the nursing notes.  Pertinent labs & imaging results that were available during my care of the patient were reviewed by me and considered in my medical decision making (see chart for details).    MDM Rules/Calculators/A&P                          Patient presents as a code stroke.  She is not a candidate for any type of acute intervention.  Unclear if this is recrudescence of old stroke versus worsening of the stroke found last time.  She will need MRI and admission.  I do wonder if dehydration could be playing a role with her arterial stenosis.  There is no clear infection.  She was given IV fluids and will need admission.  Dr. Artis FlockWolfe to admit. Final Clinical Impression(s) / ED Diagnoses Final diagnoses:  Left-sided weakness    Rx / DC Orders ED Discharge Orders     None        Pricilla LovelessGoldston, Raquel Sayres, MD 05/30/21 [phone]

## 2021-05-30 NOTE — Consult Note (Signed)
Neurology Consultation  Reason for Consult: Code Stroke, acutely nonverbal, left-sided weakness Referring Physician: Dr. Criss Alvine  CC: Acutely nonverbal, left-sided weakness  History is obtained from: EMS, Chart review. Unable to obtain history from patient due to patient acutely nonverbal.   HPI: Brandi Stewart is a 70 y.o. 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 Dysphagia, essential hypertension, hyperlipidemia, and malignant neoplasm of her left breast s/p mastectomy who presented to the ED this morning from her assisted living facility as a Code Stroke for evaluation of nonverbal state and acute left-sided weakness. On scene, family stated that they had spoken with the patient last night around 17:00 and she was able to communicate with them with her baseline aphasia but this morning, she was acutely unable to speak with family and EMS was activated. She was brought to [facility] for further neurology evaluation.  Per chart review, Brandi Stewart has been seen at [facility] for multiple previous strokes and most recently she was seen at [facility] on 6/23 for acute worsening of right-sided deficits and aphasia and was found to have an acute right subcortical infarct which was felt to be clinically asymptomatic and unrelated to presenting complaints. She was seen at [facility] November 2021 with an acute left thalamocapsular junction and left basal ganglia infarct and imaging at that time showed a chronic right MCA territory infarct. She was noted at that time to have right sided weakness from a previous stroke and remains on clopidogrel and aspirin daily for stroke prophylaxis. EEGs were obtained during November 2021 hospitalization without evidence of seizures or epileptiform discharges but due to the fact that seizures could not be ruled out, she was placed on Keppra for seizure prophylaxis. She is mainly wheelchair bound at baseline per outpatient  neurology note in February 2022.  LKW: 05/29/2021 17:00 tpa given?: no, outside of time window for thrombolytic therapy IR Thrombectomy? No, patient with poor baseline functioning. Modified Rankin Scale: 4-Needs assistance to walk and tend to bodily needs  ROS: Unable to obtain due to altered mental status.   Past Medical History:  Diagnosis Date   CVA (cerebral vascular accident) (HCC)    Dyslipidemia    Essential hypertension   Dysphagia Malignant breast cancer L breast s/p mastectomy  No past surgical history on file.  No family history on file.  Social History:   reports that she has never smoked. She has never used smokeless tobacco. She reports previous alcohol use. She reports previous drug use.  Medications  Current Facility-Administered Medications:    sodium chloride flush (NS) 0.9 % injection 3 mL, 3 mL, Intravenous, Once, Pricilla Loveless, MD  Current Outpatient Medications:    albuterol (VENTOLIN HFA) 108 (90 Base) MCG/ACT inhaler, Inhale 2 puffs into the lungs 2 (two) times daily as needed for wheezing or shortness of breath., Disp: , Rfl:    atorvastatin (LIPITOR) 40 MG tablet, Take 40 mg by mouth at bedtime., Disp: , Rfl:    levETIRAcetam (KEPPRA) 500 MG tablet, Take 500 mg by mouth 2 (two) times daily., Disp: , Rfl:    melatonin 3 MG TABS tablet, Take 3 mg by mouth at bedtime as needed (sleep)., Disp: , Rfl:    mirtazapine (REMERON) 7.5 MG tablet, Take 7.5 mg by mouth at bedtime., Disp: , Rfl:    aspirin EC 81 MG EC tablet, Take 1 tablet (81 mg total) by mouth daily for 20 days. Swallow whole., Disp: 20 tablet, Rfl: 0  clopidogrel (PLAVIX) 75 MG tablet, Take 75 mg by mouth daily., Disp: , Rfl:    telmisartan (MICARDIS) 40 MG tablet, Take 40 mg by mouth daily., Disp: , Rfl:   Exam: Current vital signs: BP 109/73   Pulse 96   Temp 99.3 F (37.4 C) (Rectal)   Resp (!) 21   SpO2 97%  Vital signs in last 24 hours:   GENERAL: Awake, alert, in no acute  distress Head: Normocephalic and atraumatic, without obvious abnormality EENT: No OP obstruction, normal conjunctivae LUNGS: Normal respiratory effort, non-labored breathing, congested cough on arrival CV: Regular rate on on telemetry, 1+ pedal edema bilaterally ABDOMEN: Soft, rounded, non-distended Integumentary: Skin intact. No rashes, abrasions, or lesions identified  Ext: warm, well perfused, without obvious deformity  NEURO:  Mental Status: Awake and alert.  Nonverbal. Unable to assess orientation due to patient's condition.  She does not nod or shake to answer yes/no questions appropriately.  She follows simple commands intermittently.  Cranial Nerves:  II: PERRL 3 mm/brisk. Does not blink to threat in the left visual field.   III, IV, VI: EOMI, will track examiner in lateral visual fields V: Unable to assess facial sensation due to patient's condition VII: Face is asymmetric with right mouth droop. She does not smile or grimace on assessment for elevation assessment.  VIII: Hearing is intact to voice IX, X: Patient does not open mouth for examiner to visualize palate, she does not phonate. XI: Does not shrug shoulders to command XII: Patient with very minimal attempt at tongue protrusion on command.  Motor:  Right hemiparesis at baseline; initially no movement on assessment. Minimal withdraw of right lower extremity noted on reassessment.  Left upper and lower extremities without effort against gravity. Will wiggle toes on the left foot and move fingers of the left hand intermittently to command.  Tone is normal on the left, increased on the right upper and lower extremities without contracture. Sustained clonus is present with right ankle dorsiflexion. Bulk is normal.  Sensation: difficult to assess Coordination: Unable to assess due to patient's condition DTRs: 3+ bilateral patellae with continuous clonus of the right ankle with dorsiflexion, 2+ and symmetric biceps Plantars:  Right: Up-going Left: Down-going Gait: Deferred  NIHSS: 1a Level of Conscious.: 0 1b LOC Questions: 2 1c LOC Commands: 1 2 Best Gaze: 0 3 Visual: 2 4 Facial Palsy: 1 5a Motor Arm - left: 3 5b Motor Arm - Right: 4 6a Motor Leg - Left: 3 6b Motor Leg - Right: 4 7 Limb Ataxia: 0 8 Sensory: 2 9 Best Language: 3 10 Dysarthria: 2 11 Extinct. and Inatten.: 0 TOTAL: 27  Labs I have reviewed labs in epic and the results pertinent to this consultation are: CBC    Component Value Date/Time   WBC 6.6 05/25/2021 1414   RBC 4.53 05/25/2021 1414   HGB 14.3 05/30/2021 0933   HCT 42.0 05/30/2021 0933   PLT 280 05/25/2021 1414   MCV 93.6 05/25/2021 1414   MCH 28.0 05/25/2021 1414   MCHC 30.0 05/25/2021 1414   RDW 14.4 05/25/2021 1414   LYMPHSABS 2.4 05/25/2021 1414   MONOABS 0.5 05/25/2021 1414   EOSABS 0.1 05/25/2021 1414   BASOSABS 0.0 05/25/2021 1414   CMP     Component Value Date/Time   NA 143 05/30/2021 0933   K 4.1 05/30/2021 0933   CL 110 05/30/2021 0933   CO2 24 05/25/2021 1414   GLUCOSE 125 (H) 05/30/2021 0933   BUN 25 (H)  05/30/2021 0933   CREATININE 0.80 05/30/2021 0933   CALCIUM 9.5 05/25/2021 1414   PROT 6.8 05/25/2021 1414   ALBUMIN 3.4 (L) 05/25/2021 1414   AST 22 05/25/2021 1414   ALT 20 05/25/2021 1414   ALKPHOS 89 05/25/2021 1414   BILITOT 0.3 05/25/2021 1414   GFRNONAA >60 05/25/2021 1414   Lipid Panel  No results found for: CHOL, TRIG, HDL, CHOLHDL, VLDL, LDLCALC, LDLDIRECT  Lab Results  Component Value Date   HGBA1C 6.2 (H) 05/25/2021   Imaging I have reviewed the images obtained:  CT-scan of the brain 05/30/2021: There is a degree of motion artifact making this study less than optimal. Stable atrophy with prior infarcts at several sites, primarily on the right. There is periventricular small vessel disease. No acute infarct is demonstrated on this study. No mass or hemorrhage. Foci of arterial vascular calcification.  Echocardiogram  05/26/2021:  1. Left ventricular ejection fraction, by estimation, is 55 to 60%. The left ventricle has normal function. The left ventricle has no regional wall motion abnormalities. There is mild left ventricular hypertrophy. Left ventricular diastolic parameters are consistent with Grade I diastolic dysfunction (impaired relaxation). Elevated left atrial pressure.   2. Right ventricular systolic function is normal. The right ventricular size is normal.   3. The mitral valve is normal in structure. Trivial mitral valve regurgitation. No evidence of mitral stenosis.   4. The aortic valve is calcified. Aortic valve regurgitation is not visualized. No aortic stenosis is present.   MR Angio Head and Neck 05/25/2021: -Intracranial atherosclerotic disease with multifocal stenoses, most notably as follows. -Progressive severe stenosis within the distal M1 right middle cerebral artery. -Redemonstrated multifocal severe stenoses within the A2 and A3 right anterior cerebral artery. Flow related enhancement is poorly appreciated within the right anterior cerebral artery distal to this. On the prior CTA of 10/10/2020, this portion of the right anterior cerebral artery demonstrated only minimal thready enhancement. -New severe stenosis versus artifact within the A3/A4 left anterior cerebral artery. -The non dominant intracranial left vertebral artery is irregular and poorly delineated beyond the left PICA origin. This is at least partially related to developmentally small vessel size. There may be superimposed high-grade stenosis. -Known high-grade stenoses within the P3 and P4 right posterior cerebral artery.  MRI Brain 05/25/2021: - The patient was unable to tolerate the full examination. As a result, only diffusion-weighted imaging could be obtained. The acquired coronal diffusion-weighted sequence is moderately motion degraded. - 2.0 x 0.8 x 1.8 cm acute infarct within the right corona radiata. - No acute  infarct identified elsewhere within the brain.  Assessment:  70 y.o. female with PMHx of multiple strokes with residual aphasia and right hemiparesis, seizures, breast cancer s/p mastectomy, HTN, and HLD who [facility] from her assisted living facility as a Code Stroke for evaluation of new left-sided weakness and nonverbal state. - Examination reveals patient with previously documented chronic right-sided hemiparesis, possibly new left-sided weakness without effort against gravity, global aphasia   - CT imaging was completed and within the limitation of motion artifact, no acute infarct was demonstrated.  - Recent admission to [facility] on 05/25/2021 as a Code Stroke with MRI results of acute right corona radiata infarct that was felt to be clinically asymptomatic-given new left-sided weakness, I suspect that the prior right hemispheric stroke has possibly expanded.  Other differentials include seizures. - No need for complete stroke work up with stroke work up during last admission < 1 week  ago   Impression: Acute left hemiparesis-possible expansion of the right corona radiata stroke from a week ago Aphasia from previous stroke with acute worsening-likely recrudescence in the setting of an acute stroke Remote multifocal strokes Possible history of seizures Right hemiparesis from previous stroke  Recommendations: - Stroke labs: Fasting lipid panel  - MRI brain without contrast to evaluate for stroke expansion -Routine EEG - Frequent neuro checks - Prophylactic therapy- Antiplatelet med: continue clopidogrel and aspirin  -Continue Keppra 500 twice daily - Risk factor modification - Telemetry monitoring - PT consult, OT consult, Speech consult for worsening dysphagia  Lanae Boast, AGAC-NP [facility] Pager: [phone]  Attending Neurohospitalist Addendum Patient seen and examined with APP/Resident. Agree with the history and physical as documented  above. Agree with the plan as documented, which I helped formulate. I have independently reviewed the chart, obtained history, review of systems and examined the patient.I have personally reviewed pertinent head/neck/spine imaging (CT/MRI). Please feel free to call with any questions.  -- Milon Dikes, MD Neurologist [facility] Pager: [phone]

## 2021-05-30 NOTE — Progress Notes (Signed)
Same day progress note MRI of the brain completed-motion degraded-compared to 05/25/2021 right corona radiata stroke size is increased from 05/25/2021 and there is a new subcentimeter acute infarct within the right external capsule.  There are chronic cortical/subcortical infarcts in the right cerebral hemisphere multiple chronic lacunar infarctions and background cerebral white matter chronic small vessel ischemic disease and generalized atrophy. Recommendations as in the initial consultation Continue dual antiplatelets PT OT speech therapy Stroke team will follow No need to repeat full stroke work-up as it was just completed less than a week ago  -- Milon Dikes, MD Neurologist [facility] Pager: [phone]

## 2021-05-30 NOTE — Plan of Care (Signed)
  Problem: Clinical Measurements: Goal: Ability to maintain clinical measurements within normal limits will improve Outcome: Progressing Goal: Will remain free from infection Outcome: Progressing Goal: Diagnostic test results will improve Outcome: Progressing Goal: Respiratory complications will improve Outcome: Progressing Goal: Cardiovascular complication will be avoided Outcome: Progressing   Problem: Safety: Goal: Ability to remain free from injury will improve Outcome: Progressing   Problem: Pain Managment: Goal: General experience of comfort will improve Outcome: Progressing   Problem: Skin Integrity: Goal: Risk for impaired skin integrity will decrease Outcome: Progressing   Problem: Ischemic Stroke/TIA Tissue Perfusion: Goal: Complications of ischemic stroke/TIA will be minimized Outcome: Progressing

## 2021-05-30 NOTE — ED Notes (Signed)
Unablee to get MSE signed and family not present.

## 2021-05-30 NOTE — ED Triage Notes (Addendum)
Pt arrived via [facility] from [facility] for left sided weakness and aphasia. Pt has right sided deficits and right hemiplegia from prior stroke. Pt is alert and aphasic. Pt LKW 05/29/2021 1700. Pt withdrawals from pain. VSS. Pt is sinus tach on monitor

## 2021-05-30 NOTE — Code Documentation (Signed)
Stroke Response Nurse Documentation Code Documentation  Rosiland Sen is a 70 y.o. female arriving to [facility] ED via [facility] on 05/30/2021 with past medical hx of CVA, HTN, Hemiplegia, and aphasia. Code stroke was activated by EMS. Patient from [facility] facility where she was LKW at 05/29/2021 1700 and now complaining of new onset of not speaking and left sided weakness. Pt was discharged on 05/26/2021 and sent to facility. Family was visiting yesterday and reported she was at her baseline (right sided weakness with some aphasia, but communicative with family with some speech per EMS). Takes ASA and PLavix. Today, the family noted that patient would not speak at all and could not move her left side.   Stroke team at the bedside on patient arrival. Labs drawn and patient cleared for CT by Dr. Criss Alvine. Patient to CT with team. NIHSS 27, see documentation for details and code stroke times. Patient with disoriented, not following commands, left hemianopia, left facial droop, bilateral arm weakness, bilateral leg weakness, right decreased sensation, Global aphasia , and dysarthria  on exam. The following imaging was completed: CT. Patient is not a candidate for tPA due to being outside window.   Care/Plan: q2 mNIHSS/VS and Stroke Work-up with Admit. Bedside handoff with ED RN Percival Spanish.    Lucila Maine  Stroke Response RN

## 2021-05-31 ENCOUNTER — Inpatient Hospital Stay (HOSPITAL_COMMUNITY): Payer: Medicare HMO

## 2021-05-31 DIAGNOSIS — I6930 Unspecified sequelae of cerebral infarction: Secondary | ICD-10-CM

## 2021-05-31 DIAGNOSIS — I679 Cerebrovascular disease, unspecified: Secondary | ICD-10-CM

## 2021-05-31 LAB — BASIC METABOLIC PANEL
Anion gap: 9 (ref 5–15)
BUN: 22 mg/dL (ref 8–23)
CO2: 21 mmol/L — ABNORMAL LOW (ref 22–32)
Calcium: 9.6 mg/dL (ref 8.9–10.3)
Chloride: 114 mmol/L — ABNORMAL HIGH (ref 98–111)
Creatinine, Ser: 0.77 mg/dL (ref 0.44–1.00)
GFR, Estimated: >60 mL/min (ref >60)
Glucose, Bld: 111 mg/dL — ABNORMAL HIGH (ref 70–99)
Potassium: 3.7 mmol/L (ref 3.5–5.1)
Sodium: 144 mmol/L (ref 135–145)

## 2021-05-31 LAB — CULTURE, BLOOD (ROUTINE X 2)
Culture: NO GROWTH
Special Requests: ADEQUATE

## 2021-05-31 LAB — MRSA NEXT GEN BY PCR, NASAL: MRSA by PCR Next Gen: NOT DETECTED

## 2021-05-31 LAB — GLUCOSE, CAPILLARY: Glucose-Capillary: 104 mg/dL — ABNORMAL HIGH (ref 70–99)

## 2021-05-31 IMAGING — CT CT ANGIO HEAD-NECK (W OR W/O PERF)
1 of 11 series · 5 of 33 positions shown · IV contrast (omnipaque)
Comparison: CT angiogram of the head and neck [DATE].

CLINICAL DATA: Stroke. Evaluate for intracranial atherosclerotic
disease.

EXAM:
CT ANGIOGRAPHY HEAD AND NECK
TECHNIQUE: Multidetector CT imaging of the head and neck was performed using
the standard protocol during bolus administration of intravenous
contrast. Multiplanar CT image reconstructions and MIPs were
obtained to evaluate the vascular anatomy. Carotid stenosis
measurements (when applicable) are obtained utilizing NASCET
criteria, using the distal internal carotid diameter as the
denominator.
CONTRAST:  60mL OMNIPAQUE IOHEXOL 350 MG/ML SOLN

[Series 11: cta neck axial · axial · 0.52mm/px · z∈[-302,-70]mm · 5 of 352 slices shown]
[im 59/352  soft-tissue]
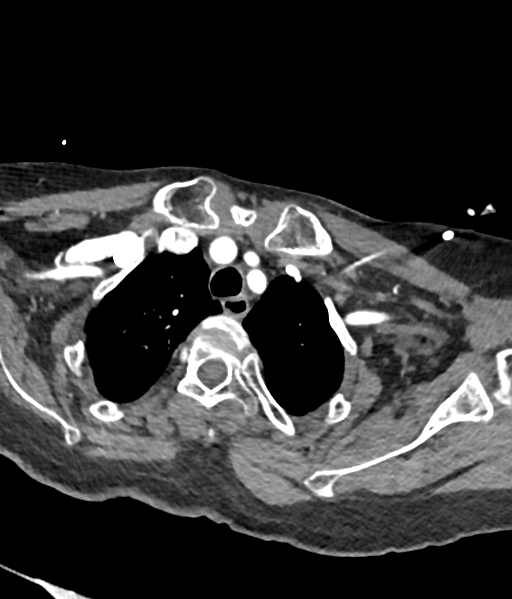
[im 118/352  bone]
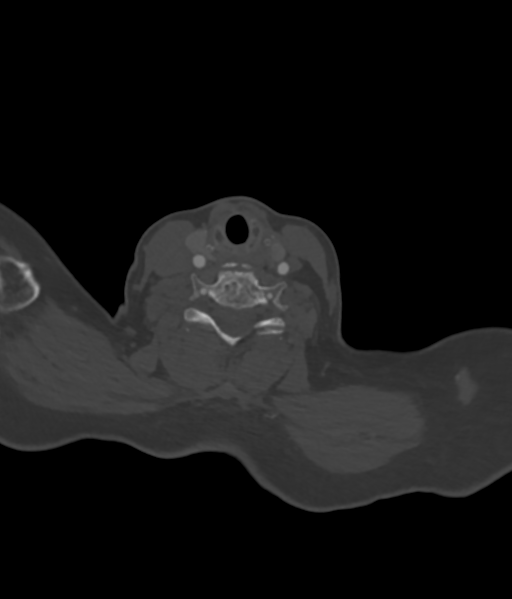
[im 176/352  soft-tissue]
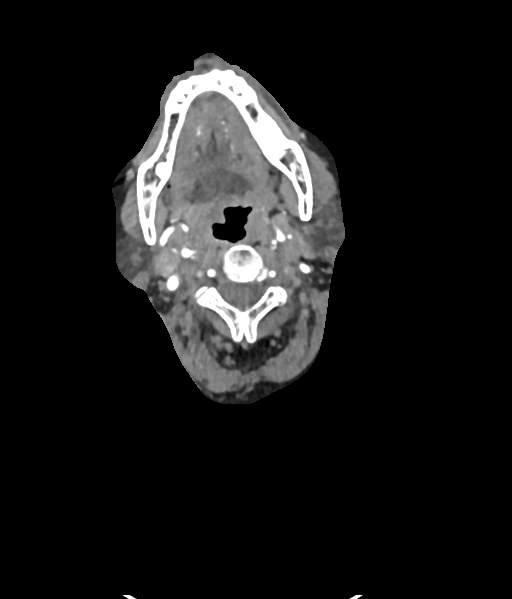
[im 235/352  bone]
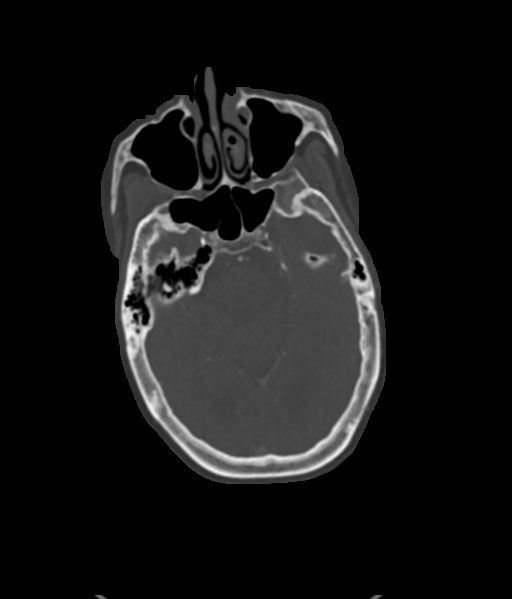
[im 293/352  soft-tissue]
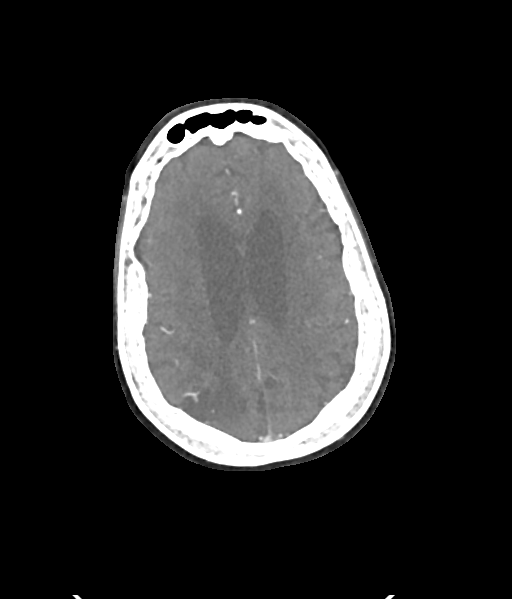

[5 of 33 positions shown; findings below may reference images not displayed]

FINDINGS: CT HEAD FINDINGS

Brain: Hypodensity within the right basal ganglia region and corona
radiata consistent with acute infarcts seen on recent MRI of the
brain. Areas of encephalomalacia and gliosis in the right
frontoparietal and occipital regions. Hypodensity of the
periventricular white matter, nonspecific, most likely related to
chronic small vessel ischemia. No hemorrhage, hydrocephalus,
extra-axial collection or mass lesion.

Vascular: No hyperdense vessel or unexpected calcification.

Skull: Normal. Negative for fracture or focal lesion.

Sinuses: Imaged portions are clear.

Orbits: No acute finding.

Review of the MIP images confirms the above findings

CTA NECK FINDINGS

Aortic arch: Standard branching. Imaged portion shows no evidence of
aneurysm or dissection. Mild atherosclerotic changes of the aortic
arch. No significant stenosis of the major arch vessel origins.

Right carotid system: Atherosclerotic changes of the right carotid
bifurcation without hemodynamically significant stenosis. Increased
tortuosity of the cervical segment of the right ICA.

Left carotid system: Atherosclerotic changes of the left carotid
bifurcation without hemodynamically significant stenosis.

Vertebral arteries: Dominant right vertebral artery with normal
course and caliber. Mild luminal irregularities along the course of
the non dominant left vertebral artery without hemodynamically
significant stenosis in the neck.

Skeleton: Degenerative changes of the cervical spine.

Other neck: Mildly heterogeneous thyroid gland with a 6 mm hypodense
right thyroid lobe nodule.

Upper chest: No acute findings.  Left subclavian Port-A-Cath.

Review of the MIP images confirms the above findings

CTA HEAD FINDINGS

Anterior circulation: Normal caliber of the intracranial internal
carotid arteries. Luminal irregularities along the bilateral ACA and
MCA vascular trees, consistent with intracranial atherosclerotic
disease. New focal area of mild stenosis in the left M1 segment and
moderate stenosis in M3 segments. Stable mild stenosis at the distal
right M1 segment. Multifocal areas of severe stenosis at the right
A2/ACA segment with occlusion at the A2-A3 junction with minimal
opacification of distal branches. Severe stenosis at the left A3/ACA
segment with diminutive caliber distally, worsened when compared to
prior CTA.

Posterior circulation: Atherosclerotic changes of the right
vertebral artery without hemodynamically significant stenosis.
Atherosclerotic changes of the left vertebral artery with occlusion
near the vertebrobasilar junction. The basilar artery is diminutive
in caliber with luminal irregularity and mild stenosis at the mid
basilar segment. A fetal right PCA is noted. Luminal irregularity of
the bilateral posterior cerebral arteries with unchanged multifocal
areas of severe stenosis at the right P2-P3 junction and P4
segments. Mild stenosis at the left V2 and V3 segments.

Venous sinuses: As permitted by contrast timing, patent.

Anatomic variants: Right fetal PCA.

Review of the MIP images confirms the above findings
IMPRESSION: 1. Hypodense foci within the right basal ganglia and corona radiata
corresponding to acute infarct seen on recent MRI of the brain.
2. Remote right frontoparietal and occipital infarcts.
3. Mild atherosclerotic disease in the major vessels of the neck
without hemodynamically significant stenosis.
4. Intracranial atherosclerotic disease, as described above,
progressed from prior CT angiogram. New moderate stenosis at left
M3/MCA segments with stable mild bilateral M1 segment stenosis.
5. Occlusion of the right ACA at the A2-A3 junction with minimal
opacification of distal branches.
6. Severe stenosis at left A3 ACA segment with diminutive caliber
distally, worsened when compared to prior CTA.
7. New occlusion of the distal left vertebral artery at the
vertebrobasilar junction.
8. Atherosclerotic disease of the bilateral posterior cerebral
arteries with multifocal areas of severe stenosis at the right PCA,
unchanged.

## 2021-05-31 MED ORDER — SODIUM CHLORIDE 0.9 % IV BOLUS
500.0000 mL | Freq: Once | INTRAVENOUS | Status: AC
  Administered 2021-05-31: 500 mL via INTRAVENOUS

## 2021-05-31 MED ORDER — ASPIRIN 81 MG PO CHEW: 81.0000 mg | CHEWABLE_TABLET | Freq: Every day | ORAL | Status: DC

## 2021-05-31 MED ORDER — IOHEXOL 350 MG/ML SOLN
60.0000 mL | Freq: Once | INTRAVENOUS | Status: AC | PRN
  Administered 2021-05-31: 60 mL via INTRAVENOUS

## 2021-05-31 MED ORDER — LACTATED RINGERS IV SOLN: INTRAVENOUS | Status: DC

## 2021-05-31 MED ORDER — ASPIRIN 300 MG RE SUPP
300.0000 mg | Freq: Every day | RECTAL | Status: DC
  Administered 2021-05-31 – 2021-06-02 (×3): 300 mg via RECTAL
  Filled 2021-05-31 (×3): qty 1

## 2021-05-31 NOTE — Evaluation (Signed)
Physical Therapy Evaluation Patient Details Name: Brandi Stewart MRN: [id number] DOB: 05-23-1951 Today's Date: 05/31/2021   History of Present Illness  70 y/o female recently discharged on 6/25 following newfound acute infarct within R corona radiata, now presented to ED on 6/28 with worsening L sided weakness and aphasia. MRI found R corona radiata stroke size is increased from 05/25/2021 and new subcentimeter acute infarct within R external capsule.  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 Patient currently with L and R hemiplegia, impaired sitting balance, impaired communication, decreased activity tolerance, and impaired functional mobility. Patient required totalA+2 for bed mobility with no initiation of movement noted. Patient following <25% of commands and non verbal this session. Patient will benefit from skilled PT services during acute stay to address listed deficits. Recommend return to SNF at discharge with potential for rehab pending progress. MD considering palliative care.    Follow Up Recommendations SNF    Equipment Recommendations  None recommended by PT    Recommendations for Other Services       Precautions / Restrictions Precautions Precautions: Fall Precaution Comments: R hemi, aphasia/dysarthria at baseline, new L sided weakness and non verbal. Restrictions Weight Bearing Restrictions: No      Mobility  Bed Mobility Overal bed mobility: Needs Assistance Bed Mobility: Supine to Sit;Sit to Supine Rolling: +2 for physical assistance;Total assist   Supine to sit: Total assist;+2 for physical assistance;+2 for safety/equipment Sit to supine: Total assist;+2 for physical assistance;+2 for safety/equipment   General bed mobility comments: no initiation of movement, totalA+2 for all aspects    Transfers                  General transfer comment: will need hoyer lift  Ambulation/Gait                Stairs            Wheelchair Mobility    Modified Rankin (Stroke Patients Only) Modified Rankin (Stroke Patients Only) Pre-Morbid Rankin Score: Moderately severe disability Modified Rankin: Severe disability     Balance Overall balance assessment: Needs assistance Sitting-balance support: Feet supported Sitting balance-Leahy Scale: Zero Sitting balance - Comments: totalA                                     Pertinent Vitals/Pain Pain Assessment: Faces Faces Pain Scale: No hurt Pain Intervention(s): Monitored during session    Home Living Family/patient expects to be discharged to:: Skilled nursing facility                      Prior Function Level of Independence: Needs assistance   Gait / Transfers Assistance Needed: per chart used w/c and RW  ADL's / Homemaking Assistance Needed: per chart: needs assist with ADL        Hand Dominance        Extremity/Trunk Assessment   Upper Extremity Assessment Upper Extremity Assessment: Defer to OT evaluation RUE Deficits / Details: prior stroke with increased tone and decreased AROM noted LUE Deficits / Details: Decreased AROM noted.    Lower Extremity Assessment Lower Extremity Assessment: RLE deficits/detail;LLE deficits/detail RLE Deficits / Details: 0/5, no movement noted LLE Deficits / Details: grossly 2-/5 mostly ankle and some hip abductor/adductor    Cervical /  Trunk Assessment Cervical / Trunk Assessment: Kyphotic  Communication   Communication: Expressive difficulties;Other (comment) (non verbal)  Cognition Arousal/Alertness: Awake/alert Behavior During Therapy: WFL for tasks assessed/performed Overall Cognitive Status: Difficult to assess Area of Impairment: Following commands                       Following Commands: Follows one step commands inconsistently        General Comments: Following <25% of commands, difficult to assess cognition due to aphasia      General Comments      Exercises     Assessment/Plan    PT Assessment Patient needs continued PT services  PT Problem List Decreased strength;Decreased range of motion;Decreased balance;Decreased mobility;Decreased activity tolerance;Decreased coordination;Decreased safety awareness;Impaired sensation       PT Treatment Interventions DME instruction;Functional mobility training;Therapeutic activities;Therapeutic exercise;Balance training;Neuromuscular re-education;Patient/family education    PT Goals (Current goals can be found in the Care Plan section)  Acute Rehab PT Goals Patient Stated Goal: did not state PT Goal Formulation: Patient unable to participate in goal setting Time For Goal Achievement: 06/14/21 Potential to Achieve Goals: Poor    Frequency Min 2X/week   Barriers to discharge        Co-evaluation PT/OT/SLP Co-Evaluation/Treatment: Yes Reason for Co-Treatment: Complexity of the patient's impairments (multi-system involvement);To address functional/ADL transfers;For patient/therapist safety PT goals addressed during session: Mobility/safety with mobility;Balance OT goals addressed during session: ADL's and self-care       AM-PAC PT "6 Clicks" Mobility  Outcome Measure Help needed turning from your back to your side while in a flat bed without using bedrails?: Total Help needed moving from lying on your back to sitting on the side of a flat bed without using bedrails?: Total Help needed moving to and from a bed to a chair (including a wheelchair)?: Total Help needed standing up from a chair using your arms (e.g., wheelchair or bedside chair)?: Total Help needed to walk in hospital room?: Total Help needed climbing 3-5 steps with a railing? : Total 6 Click Score: 6    End of Session   Activity Tolerance: Patient tolerated treatment well Patient left: in  bed;with call bell/phone within reach;with bed alarm set Nurse Communication: Mobility status PT Visit Diagnosis: Muscle weakness (generalized) (M62.81);Other symptoms and signs involving the nervous system (R29.898) Hemiplegia - Right/Left: Right (Left) Hemiplegia - caused by: Cerebral infarction    Time: 2831-5176 PT Time Calculation (min) (ACUTE ONLY): 14 min   Charges:   PT Evaluation $PT Eval Moderate Complexity: 1 Mod          Maxcine Strong A. Dan Humphreys PT, DPT Acute Rehabilitation Services Pager [phone] Office [phone]   Viviann Spare 05/31/2021, 1:06 PM

## 2021-05-31 NOTE — Progress Notes (Signed)
EEG Completed; Results Pending  

## 2021-05-31 NOTE — Evaluation (Signed)
Clinical/Bedside Swallow Evaluation Patient Details  Name: Brandi Stewart MRN: [id number] Date of Birth: 15-Jul-1951  Today's Date: 05/31/2021 Time: SLP Start Time (ACUTE ONLY): 1345 SLP Stop Time (ACUTE ONLY): 1400 SLP Time Calculation (min) (ACUTE ONLY): 15 min  Past Medical History:  Past Medical History:  Diagnosis Date   Cognitive communication deficit    CVA (cerebral vascular accident) (HCC)    Dyslipidemia    Dysphagia following unspecified cerebrovascular disease    Dysphagia, oropharyngeal phase    Essential hypertension    Hemiplegia, unspecified affecting right dominant side (HCC)    Muscle weakness (generalized)    Other abnormalities of gait and mobility    Personal history of malignant neoplasm of breast    Past Surgical History: No past surgical history on file. HPI:  70 y/o female recently discharged on 6/25 following newfound acute infarct within R corona radiata, now presented to ED on 6/28 with worsening L sided weakness and aphasia. MRI found R corona radiata stroke size is increased from 05/25/2021 and new subcentimeter acute infarct within R external capsule. Per neuro notes from recent admission, pt was able to speak with evidence of aphasia and dysarthria; followed basic commands. 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, HTN, HLD   Assessment / Plan / Recommendation Clinical Impression  Pt presents with an oropharyngeal dysphagia with significant oral involvement, leading to difficulty receiving, holding and controlling ice chip and thin liquid boluses.  There is left CNVII impairment leading to the above deficits.  Pt had difficulty following oral commands.  She participated in limited mouth care.  She demonstrated a consistent and palpable swallow response, though likely delayed and its functionality is questionable. Initiation and effort improved as session progressed.  Given degree of neuro involvement, recommend proceeding with MBS  next date and continuing NPO pending results of that study. D/W Dr. Roda Shutters and RN. Will f/u next date. SLP Visit Diagnosis: Dysphagia, oropharyngeal phase (R13.12)    Aspiration Risk    tba   Diet Recommendation    NPO      Other  Recommendations Oral Care Recommendations: Oral care QID   Follow up Recommendations Skilled Nursing facility      Frequency and Duration min 3x week  2 weeks       Prognosis        Swallow Study   General HPI: 70 y/o female recently discharged on 6/25 following newfound acute infarct within R corona radiata, now presented to ED on 6/28 with worsening L sided weakness and aphasia. MRI found R corona radiata stroke size is increased from 05/25/2021 and new subcentimeter acute infarct within R external capsule. Per neuro notes from recent admission, pt was able to speak with evidence of aphasia and dysarthria; followed basic commands. 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, HTN, HLD Type of Study: Bedside Swallow Evaluation Previous Swallow Assessment: no Diet Prior to this Study: NPO Temperature Spikes Noted: No Respiratory Status: Room air History of Recent Intubation: No Behavior/Cognition: Alert Oral Cavity Assessment: Excessive secretions Oral Care Completed by SLP: Yes Self-Feeding Abilities: Total assist Patient Positioning: Upright in bed Baseline Vocal Quality: Not observed Volitional Cough: Cognitively unable to elicit Volitional Swallow: Unable to elicit    Oral/Motor/Sensory Function Overall Oral Motor/Sensory Function: Moderate impairment Facial ROM: Reduced left;Suspected CN VII (facial) dysfunction Facial Symmetry: Abnormal symmetry left;Suspected CN VII (facial) dysfunction Lingual Symmetry:  (unable to assess)   [place]  chips: Impaired Presentation: Spoon Oral Phase Impairments: Reduced labial seal;Reduced lingual movement/coordination Oral Phase Functional Implications: Left anterior  spillage;Prolonged oral transit;Oral holding Pharyngeal Phase Impairments: Suspected delayed Swallow   Thin Liquid Thin Liquid: Impaired Presentation: Spoon Oral Phase Impairments: Reduced labial seal;Reduced lingual movement/coordination Oral Phase Functional Implications: Left anterior spillage;Oral holding Pharyngeal  Phase Impairments: Suspected delayed Swallow    Nectar Thick Nectar Thick Liquid: Not tested   Honey Thick Honey Thick Liquid: Not tested   Puree Puree: Not tested   Solid     Solid: Not tested     [name], MA CCC/SLP Acute Rehabilitation Services Office number [phone] Pager [phone]  [name] 05/31/2021,3:12 PM

## 2021-05-31 NOTE — Evaluation (Signed)
Occupational Therapy Evaluation Patient Details Name: Brandi Stewart MRN: [id number] DOB: September 06, 1951 Today's Date: 05/31/2021    History of Present Illness 70 year old female with history of multiple strokes with residual aphasia and right hemiparesis, seizures who presented with new left-sided weakness and nonverbal state.   Clinical Impression   Patient admitted for the diagnosis above.  Patient was unable to fully participate with any aspect of OT evaluation.  OT recommends return to SNF for long term total care.  MD is considering palliative care.  Recommend staff turn patient every few hours for skin integrity, and hoyer lift for out of bed.  No acute OT needs identified.  She will most likely need OT assessment at next level for bilateral upper extremity splinting needs.      Follow Up Recommendations  SNF    Equipment Recommendations  None recommended by OT    Recommendations for Other Services       Precautions / Restrictions Precautions Precautions: Fall Precaution Comments: R hemi, aphasia/dysarthria at baseline, new L sided weakness and non verbal. Restrictions Weight Bearing Restrictions: No      Mobility Bed Mobility     Rolling: +2 for physical assistance;Total assist   Supine to sit: +2 for physical assistance;Total assist Sit to supine: Total assist;+2 for physical assistance     Patient Response: Flat affect  Transfers                 General transfer comment: hoyer lift    Balance Overall balance assessment: Needs assistance Sitting-balance support: Feet supported Sitting balance-Leahy Scale: Zero                                     ADL either performed or assessed with clinical judgement   ADL   Eating/Feeding: NPO   Grooming: Total assistance;Bed level   Upper Body Bathing: Total assistance;Bed level   Lower Body Bathing: Total assistance;Bed level   Upper Body Dressing : Total assistance;Bed level   Lower Body  Dressing: Total assistance;Bed level   Toilet Transfer: Total assistance   Toileting- Clothing Manipulation and Hygiene: Total assistance;Bed level       Functional mobility during ADLs: Total assistance       Vision   Alignment/Gaze Preference: Gaze right Tracking/Visual Pursuits: Decreased smoothness of eye movement to LEFT superior field;Decreased smoothness of eye movement to RIGHT superior field Visual Fields: Left visual field deficit Additional Comments: Can turn her head and gaze to the L with cueing     Perception     Praxis      Pertinent Vitals/Pain Pain Assessment: Faces Faces Pain Scale: No hurt Pain Intervention(s): Monitored during session     Hand Dominance     Extremity/Trunk Assessment Upper Extremity Assessment Upper Extremity Assessment: Generalized weakness;RUE deficits/detail;LUE deficits/detail RUE Deficits / Details: prior stroke with increased tone and decreased AROM noted LUE Deficits / Details: Decreased AROM noted.   Lower Extremity Assessment Lower Extremity Assessment: Defer to PT evaluation   Cervical / Trunk Assessment Cervical / Trunk Assessment: Kyphotic   Communication Communication Communication: Expressive difficulties;Other (comment) (non verbal)   Cognition                                       General Comments: Appears to follow some commands, inconsistent, cognitive assessmsnt complicated by non verbal.  General Comments       Exercises     Shoulder Instructions      Home Living Family/patient expects to be discharged to:: Skilled nursing facility                                        Prior Functioning/Environment Level of Independence: Needs assistance  Gait / Transfers Assistance Needed: per chart used w/c and RW ADL's / Homemaking Assistance Needed: per chart: needs assist with ADL            OT Problem List: Decreased strength;Decreased range of motion;Impaired  balance (sitting and/or standing);Decreased cognition;Impaired vision/perception      OT Treatment/Interventions:      OT Goals(Current goals can be found in the care plan section) Acute Rehab OT Goals Patient Stated Goal: did not state OT Goal Formulation: Patient unable to participate in goal setting Time For Goal Achievement: 05/31/21 Potential to Achieve Goals: Fair  OT Frequency:     Barriers to D/C:  None noted          Co-evaluation PT/OT/SLP Co-Evaluation/Treatment: Yes Reason for Co-Treatment: Complexity of the patient's impairments (multi-system involvement);For patient/therapist safety   OT goals addressed during session: ADL's and self-care      AM-PAC OT "6 Clicks" Daily Activity     Outcome Measure Help from another person eating meals?: Total Help from another person taking care of personal grooming?: Total Help from another person toileting, which includes using toliet, bedpan, or urinal?: Total Help from another person bathing (including washing, rinsing, drying)?: Total Help from another person to put on and taking off regular upper body clothing?: Total Help from another person to put on and taking off regular lower body clothing?: Total 6 Click Score: 6   End of Session Nurse Communication: Mobility status;Other (comment) (patient unable to use call bell)  Activity Tolerance: Patient tolerated treatment well Patient left: in bed;with call bell/phone within reach;with bed alarm set  OT Visit Diagnosis: Muscle weakness (generalized) (M62.81);Hemiplegia and hemiparesis;Other symptoms and signs involving cognitive function Hemiplegia - dominant/non-dominant: Dominant;Non-Dominant Hemiplegia - caused by: Cerebral infarction                Time: 1110-1130 OT Time Calculation (min): 20 min Charges:  OT General Charges $OT Visit: 1 Visit OT Evaluation $OT Eval Moderate Complexity: 1 Mod  05/31/2021  Rich, OTR/L  Acute Rehabilitation Services  Office:   [phone]   Suzanna Obey 05/31/2021, 12:10 PM

## 2021-05-31 NOTE — NC FL2 (Signed)
[place] MEDICAID FL2 LEVEL OF CARE SCREENING TOOL     IDENTIFICATION  Patient Name: Brandi Stewart Birthdate: 1951/04/14 Sex: female Admission Date (Current Location): 05/30/2021  [facility] and [place] Number:  Producer, television/film/video and Address:  [facility] [place], [address], [place], [place] [address]      Provider Number: [id number]  Attending Physician Name and Address:  Elease Etienne, MD  Relative Name and Phone Number:       Current Level of Care: Hospital Recommended Level of Care: Skilled Nursing Facility Prior Approval Number:    Date Approved/Denied:   PASRR Number:    Discharge Plan: SNF    Current Diagnoses: Patient Active Problem List   Diagnosis Date Noted   Acute left hemiparesis (HCC) 05/30/2021   History of CVA with residual deficit 05/30/2021   Prediabetes 05/30/2021   Acute left-sided weakness 05/30/2021   Acute CVA (cerebrovascular accident) (HCC) 05/30/2021   Essential hypertension 05/25/2021   Dyslipidemia 05/25/2021    Orientation RESPIRATION BLADDER Height & Weight     Self  Normal Incontinent Weight: 167 lb 15.9 oz (76.2 kg) Height:  5\' 6"  (167.6 cm)  BEHAVIORAL SYMPTOMS/MOOD NEUROLOGICAL BOWEL NUTRITION STATUS      Incontinent Diet (see DC summary)  AMBULATORY STATUS COMMUNICATION OF NEEDS Skin   Extensive Assist   Normal                       Personal Care Assistance Level of Assistance  Bathing, Feeding, Dressing Bathing Assistance: Maximum assistance Feeding assistance: Limited assistance Dressing Assistance: Maximum assistance     Functional Limitations Info  Sight, Hearing, Speech Sight Info: Adequate Hearing Info: Adequate Speech Info: Adequate    SPECIAL CARE FACTORS FREQUENCY                       Contractures Contractures Info: Not present    Additional Factors Info  Code Status, Allergies Code Status Info: Full Allergies Info: Statins           Current Medications  (05/31/2021):  This is the current hospital active medication list Current Facility-Administered Medications  Medication Dose Route Frequency Provider Last Rate Last Admin    stroke: mapping our early stages of recovery book   Does not apply Once 06/02/2021, MD       acetaminophen (TYLENOL) tablet 650 mg  650 mg Oral Q4H PRN Orland Mustard, MD       Or   acetaminophen (TYLENOL) 160 MG/5ML solution 650 mg  650 mg Per Tube Q4H PRN Orland Mustard, MD       Or   acetaminophen (TYLENOL) suppository 650 mg  650 mg Rectal Q4H PRN Orland Mustard, MD       albuterol (PROVENTIL) (2.5 MG/3ML) 0.083% nebulizer solution 3 mL  3 mL Inhalation BID PRN Orland Mustard, MD       aspirin chewable tablet 81 mg  81 mg Oral Daily Heard, Courtney S, NP       atorvastatin (LIPITOR) tablet 40 mg  40 mg Oral QHS 10-15-1985, MD       clopidogrel (PLAVIX) tablet 75 mg  75 mg Oral Daily Orland Mustard, MD       enoxaparin (LOVENOX) injection 40 mg  40 mg Subcutaneous Q24H Orland Mustard, MD   40 mg at 05/30/21 2058   levETIRAcetam (KEPPRA) IVPB 500 mg/100 mL premix  500 mg Intravenous Q12H 2059, MD  Stopped at 05/31/21 0631   melatonin tablet 3 mg  3 mg Oral QHS PRN Orland Mustard, MD       mirtazapine (REMERON) tablet 7.5 mg  7.5 mg Oral QHS Orland Mustard, MD         Discharge Medications: Please see discharge summary for a list of discharge medications.  Relevant Imaging Results:  Relevant Lab Results:   Additional Information SS#: [id number]  Baldemar Lenis, LCSW

## 2021-05-31 NOTE — Evaluation (Signed)
Speech Language Pathology Evaluation Patient Details Name: Brandi Stewart MRN: [id number] DOB: December 24, 1950 Today's Date: 05/31/2021 Time: 1700-1749 SLP Time Calculation (min) (ACUTE ONLY): 15 min  Problem List:  Patient Active Problem List   Diagnosis Date Noted   Acute left hemiparesis (HCC) 05/30/2021   History of CVA with residual deficit 05/30/2021   Prediabetes 05/30/2021   Acute left-sided weakness 05/30/2021   Acute CVA (cerebrovascular accident) (HCC) 05/30/2021   Essential hypertension 05/25/2021   Dyslipidemia 05/25/2021   Past Medical History:  Past Medical History:  Diagnosis Date   Cognitive communication deficit    CVA (cerebral vascular accident) (HCC)    Dyslipidemia    Dysphagia following unspecified cerebrovascular disease    Dysphagia, oropharyngeal phase    Essential hypertension    Hemiplegia, unspecified affecting right dominant side (HCC)    Muscle weakness (generalized)    Other abnormalities of gait and mobility    Personal history of malignant neoplasm of breast    Past Surgical History: No past surgical history on file. HPI:  70 y/o female recently discharged on 6/25 following newfound acute infarct within R corona radiata, now presented to ED on 6/28 with worsening L sided weakness and aphasia. MRI found R corona radiata stroke size is increased from 05/25/2021 and new subcentimeter acute infarct within R external capsule. Per neuro notes from recent admission, pt was able to speak with evidence of aphasia and dysarthria; followed basic commands.She was able to communicate her needs and and basic ideas per her son. 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 Pt was able to follow some simple commands. She actively looked around the room and focused on several objects when named (e.g, tv, window, door, spoon).  She attempted to blink her eyes for yes/no responses, but discerning two blinks from one was complicated. With YES and NO signs posted in room, pt was able to look at each sign with accuracy.  She will need cues to use signs to indicate her response in functional situations.  While documenting this report, her son, Brandi Stewart, arrived. We discussed results of testing and plans for [facility] tomorrow. He verbalized agreement.    SLP Assessment  SLP Recommendation/Assessment: Patient needs continued Speech Lanaguage Pathology Services SLP Visit Diagnosis: Dysarthria and anarthria (R47.1)    Follow Up Recommendations  Skilled Nursing facility    Frequency and Duration min 3x week  2 weeks      SLP Evaluation Cognition  Overall Cognitive Status: Impaired/Different from baseline Arousal/Alertness: Awake/alert Orientation Level: Other (comment) (UTA non verbal)       Comprehension  Auditory Comprehension Overall Auditory Comprehension: Impaired Yes/No Questions: Impaired Basic Biographical Questions: 51-75% accurate Commands: Impaired One Step Basic Commands: 75-100% accurate Two Step Basic Commands: 25-49% accurate Visual Recognition/Discrimination Discrimination: Exceptions to [facility] Common Objects: Able for objects in room Reading Comprehension Reading Status:  (tba)    Expression Verbal Expression Overall Verbal Expression: Impaired (no verbal output) Initiation: Impaired Common Objects: Able for objects in room   Oral / Motor  Oral Motor/Sensory Function Overall Oral Motor/Sensory Function: Moderate impairment Facial ROM: Reduced left;Suspected CN VII (facial) dysfunction Facial Symmetry: Abnormal symmetry left;Suspected CN VII (facial) dysfunction Lingual  Symmetry:   (unable to assess) Motor Speech Overall Motor Speech: Impaired Phonation: Low vocal intensity Articulation: Impaired Intelligibility: Unable to assess (comment)   GO                   Brandi Stewart L. Samson Frederic, MA CCC/SLP Acute Rehabilitation Services Office number [phone] Pager [phone]  Brandi Stewart 05/31/2021, 3:28 PM

## 2021-05-31 NOTE — Progress Notes (Signed)
PROGRESS NOTE   Brandi Stewart  OZH:[id number]    DOB: January 23, 1951    DOA: 05/30/2021  PCP: Pcp, No   I have briefly reviewed patients previous medical records in [name]  Chief Complaint  Patient presents with   Code Stroke    Brief Narrative:   70 y.o. 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  She was recently hospitalized 05/25/2021 - 05/27/2021 when MRI brain revealed subcortical infarct, neurology recommended continuing dual antiplatelets x3 weeks then Plavix alone and continuing her Keppra.  Code stroke was called in the ED.  Patient admitted for recurrent acute stroke.  Failed swallow evaluation, MBS pending.  Neurology/stroke team consulted.   Assessment & Plan:  Principal Problem:   Acute left hemiparesis (HCC) Active Problems:   History of CVA with residual deficit   Prediabetes   Acute left-sided weakness   Acute CVA (cerebrovascular accident) (HCC)   Acute stroke with left hemiparesis and aphasia: History of multiple prior CVAs.  She now presented with left-sided weakness and nonverbal state.  Not a candidate for tPA or mechanical thrombectomy.  Neurology/stroke service consulted and their input appreciated.  She recently had a full stroke work-up.  MRI this admission shows increase in size of prior right corona radiator stroke.  As per neurology, her stroke etiology is cryptogenic but suspect possibly due to IC AD given notable stenosis on vessel imaging last admission and they plan to obtain CTA of the head and neck to further evaluate.  May require EP consultation for loop recorder to rule out cardioembolic stroke versus occult A. fib.  Continue aspirin 81 Mg daily plus Plavix 75 Mg daily through 06/15/2021 (completes 3 weeks) followed by Plavix alone, increased atorvastatin  from 40 to 80 mg daily, outpatient follow-up in stroke clinic.  PT and OT recommend return to SNF.  SLP input appreciated, failed swallow evaluation, plan MBS, continue n.p.o. and IV fluids in the interim.  EEG shows encephalopathy features without seizures.  Essential hypertension/hypotension Hypotensive range blood pressures this morning.  Bolused with IV normal saline 500 mL.  Continue IV fluids.  Hold blood pressure medications to allow for permissive hypertension from recent acute stroke.  Hyperlipidemia Atorvastatin increased from 40 mg daily to 80 mg daily this admission.  Follow fasting lipids and LFTs in 6 weeks.  Prediabetes: A1c 6.2.  Outpatient follow-up.  Chronic diastolic CHF 2D echo 6/24: LVEF 55-60% and grade 1 diastolic dysfunction.  Clinically on the euvolemic or even dehydrated side.  Monitor while gently hydrating IV.  Dehydration with ketonuria and elevated lactate Gentle IV fluids and follow clinically.  Body mass index is 27.11 kg/m.  Adult failure to thrive Secondary to recurrent strokes and multiple medical problems.  Requested palliative care consult to address goals of care.   DVT prophylaxis: enoxaparin (LOVENOX) injection 40 mg Start: 05/30/21 2200     Code Status: Full Code Family Communication: None at bedside. Disposition:  Status is: Inpatient  Remains inpatient appropriate because:Inpatient level of care appropriate due to severity of illness  Dispo: The patient is from: SNF              Anticipated d/c is to: SNF              Patient currently is not medically stable to d/c.   Difficult  to place patient No        Consultants:   Neurology Palliative care medicine  Procedures:   None  Antimicrobials:    Anti-infectives (From admission, onward)    None         Subjective:  Patient seen this morning.  Tracking activities with eyes but nonverbal and not following any instructions.  As per nursing, followed some instructions  earlier.  Objective:   Vitals:   05/31/21 0353 05/31/21 0738 05/31/21 1210 05/31/21 1636  BP:  114/81 94/69 (!) 147/88  Pulse:  84 86 88  Resp:  16 16 18   Temp:  98 F (36.7 C) 98.9 F (37.2 C) 98.6 F (37 C)  TempSrc:  Oral Oral Oral  SpO2:  97% 100% 100%  Weight: 76.2 kg     Height:        General exam: Elderly female, moderately built and nourished lying comfortably propped up in bed without distress. Respiratory system: Clear to auscultation. Respiratory effort normal. Cardiovascular system: S1 & S2 heard, RRR. No JVD, murmurs, rubs, gallops or clicks. No pedal edema.  Telemetry personally reviewed: Sinus rhythm. Gastrointestinal system: Abdomen is nondistended, soft and nontender. No organomegaly or masses felt. Normal bowel sounds heard. Central nervous system: Appeared somewhat lethargic but arousable.  Woke up and then tracking activity around with her eyes.  Nonverbal.  Diminished right nasolabial fold.   Extremities: Not moving any limbs spontaneously or to touch.  Did not provide noxious stimuli. Skin: No rashes, lesions or ulcers Psychiatry: Judgement and insight impaired.  Mood & affect cannot be assessed    Data Reviewed:   I have personally reviewed following labs and imaging studies   CBC: Recent Labs  Lab 05/25/21 1414 05/25/21 1425 05/30/21 0933 05/30/21 0958  WBC 6.6  --   --  9.9  NEUTROABS 3.6  --   --  8.4*  HGB 12.7 14.6 14.3 13.3  HCT 42.4 43.0 42.0 42.9  MCV 93.6  --   --  91.7  PLT 280  --   --  363    Basic Metabolic Panel: Recent Labs  Lab 05/25/21 1414 05/25/21 1425 05/30/21 0930 05/30/21 0933 05/31/21 0343  NA 143   < > 140 143 144  K 4.1   < > 4.5 4.1 3.7  CL 109   < > 108 110 114*  CO2 24  --  18*  --  21*  GLUCOSE 116*   < > 122* 125* 111*  BUN 10   < > 20 25* 22  CREATININE 0.77   < > 1.08* 0.80 0.77  CALCIUM 9.5  --  9.6  --  9.6   < > = values in this interval not displayed.    Liver Function Tests: Recent Labs   Lab 05/25/21 1414 05/30/21 0930  AST 22 33  ALT 20 22  ALKPHOS 89 79  BILITOT 0.3 1.3*  PROT 6.8 7.2  ALBUMIN 3.4* 3.7    CBG: Recent Labs  Lab 05/25/21 1413 05/30/21 0929 05/31/21 0351  GLUCAP 121* 86 104*    Microbiology Studies:   Recent Results (from the past 240 hour(s))  Resp Panel by RT-PCR (Flu A&B, Covid) Nasopharyngeal Swab     Status: None   Collection Time: 05/25/21  3:19 PM   Specimen: Nasopharyngeal Swab; Nasopharyngeal(NP) swabs in vial transport medium  Result Value Ref Range Status   SARS Coronavirus 2 by RT PCR NEGATIVE NEGATIVE Final    Comment: (NOTE) SARS-CoV-2 target  nucleic acids are NOT DETECTED.  The SARS-CoV-2 RNA is generally detectable in upper respiratory specimens during the acute phase of infection. The lowest concentration of SARS-CoV-2 viral copies this assay can detect is 138 copies/mL. A negative result does not preclude SARS-Cov-2 infection and should not be used as the sole basis for treatment or other patient management decisions. A negative result may occur with  improper specimen collection/handling, submission of specimen other than nasopharyngeal swab, presence of viral mutation(s) within the areas targeted by this assay, and inadequate number of viral copies(<138 copies/mL). A negative result must be combined with clinical observations, patient history, and epidemiological information. The expected result is Negative.  Fact Sheet for Patients:  BloggerCourse.comhttps://www.fda.gov/media/152166/download  Fact Sheet for Healthcare Providers:  SeriousBroker.ithttps://www.fda.gov/media/152162/download  This test is no t yet approved or cleared by the [place] FDA and  has been authorized for detection and/or diagnosis of SARS-CoV-2 by FDA under an Emergency Use Authorization (EUA). This EUA will remain  in effect (meaning this test can be used) for the duration of the COVID-19 declaration under Section 564(b)(1) of the Act, 21 U.S.C.section  360bbb-3(b)(1), unless the authorization is terminated  or revoked sooner.       Influenza A by PCR NEGATIVE NEGATIVE Final   Influenza B by PCR NEGATIVE NEGATIVE Final    Comment: (NOTE) The Xpert Xpress SARS-CoV-2/FLU/RSV plus assay is intended as an aid in the diagnosis of influenza from Nasopharyngeal swab specimens and should not be used as a sole basis for treatment. Nasal washings and aspirates are unacceptable for Xpert Xpress SARS-CoV-2/FLU/RSV testing.  Fact Sheet for Patients: BloggerCourse.comhttps://www.fda.gov/media/152166/download  Fact Sheet for Healthcare Providers: SeriousBroker.ithttps://www.fda.gov/media/152162/download  This test is not yet approved or cleared by the [place] FDA and has been authorized for detection and/or diagnosis of SARS-CoV-2 by FDA under an Emergency Use Authorization (EUA). This EUA will remain in effect (meaning this test can be used) for the duration of the COVID-19 declaration under Section 564(b)(1) of the Act, 21 U.S.C. section 360bbb-3(b)(1), unless the authorization is terminated or revoked.  Performed at [facility], [address] [place], [place] [id number]   Culture, blood (routine x 2)     Status: Abnormal   Collection Time: 05/25/21  8:57 PM   Specimen: BLOOD LEFT HAND  Result Value Ref Range Status   Specimen Description BLOOD LEFT HAND  Final   Special Requests   Final    BOTTLES DRAWN AEROBIC ONLY Blood Culture adequate volume   Culture  Setup Time   Final    AEROBIC BOTTLE ONLY GRAM POSITIVE COCCI CRITICAL RESULT CALLED TO, READ BACK BY AND VERIFIED WITH: L SEAY PHARMD 05/26/21 2303 JDW    Culture (A)  Final    STREPTOCOCCUS MITIS/ORALIS THE SIGNIFICANCE OF ISOLATING THIS ORGANISM FROM A SINGLE SET OF BLOOD CULTURES WHEN MULTIPLE SETS ARE DRAWN IS UNCERTAIN. PLEASE NOTIFY THE MICROBIOLOGY DEPARTMENT WITHIN ONE WEEK IF SPECIATION AND SENSITIVITIES ARE REQUIRED. Performed at [facility], [address] [place], [place]  [id number]    Report Status 05/28/2021 FINAL  Final  Culture, blood (routine x 2)     Status: None   Collection Time: 05/25/21  8:57 PM   Specimen: BLOOD  Result Value Ref Range Status   Specimen Description BLOOD SITE NOT SPECIFIED  Final   Special Requests   Final    BOTTLES DRAWN AEROBIC AND ANAEROBIC Blood Culture adequate volume   Culture   Final    NO GROWTH 5  DAYS Performed at [facility], [address] [place], [place] [address]    Report Status 05/31/2021 FINAL  Final  Blood Culture ID Panel (Reflexed)     Status: Abnormal   Collection Time: 05/25/21  8:57 PM  Result Value Ref Range Status   Enterococcus faecalis NOT DETECTED NOT DETECTED Final   Enterococcus Faecium NOT DETECTED NOT DETECTED Final   Listeria monocytogenes NOT DETECTED NOT DETECTED Final   Staphylococcus species NOT DETECTED NOT DETECTED Final   Staphylococcus aureus (BCID) NOT DETECTED NOT DETECTED Final   Staphylococcus epidermidis NOT DETECTED NOT DETECTED Final   Staphylococcus lugdunensis NOT DETECTED NOT DETECTED Final   Streptococcus species DETECTED (A) NOT DETECTED Final    Comment: Not Enterococcus species, Streptococcus agalactiae, Streptococcus pyogenes, or Streptococcus pneumoniae. CRITICAL RESULT CALLED TO, READ BACK BY AND VERIFIED WITH: L SEAY PHARMD 05/26/21 2303 JDW    Streptococcus agalactiae NOT DETECTED NOT DETECTED Final   Streptococcus pneumoniae NOT DETECTED NOT DETECTED Final   Streptococcus pyogenes NOT DETECTED NOT DETECTED Final   A.calcoaceticus-baumannii NOT DETECTED NOT DETECTED Final   Bacteroides fragilis NOT DETECTED NOT DETECTED Final   Enterobacterales NOT DETECTED NOT DETECTED Final   Enterobacter cloacae complex NOT DETECTED NOT DETECTED Final   Escherichia coli NOT DETECTED NOT DETECTED Final   Klebsiella aerogenes NOT DETECTED NOT DETECTED Final   Klebsiella oxytoca NOT DETECTED NOT DETECTED Final   Klebsiella pneumoniae NOT DETECTED NOT DETECTED Final    Proteus species NOT DETECTED NOT DETECTED Final   Salmonella species NOT DETECTED NOT DETECTED Final   Serratia marcescens NOT DETECTED NOT DETECTED Final   Haemophilus influenzae NOT DETECTED NOT DETECTED Final   Neisseria meningitidis NOT DETECTED NOT DETECTED Final   Pseudomonas aeruginosa NOT DETECTED NOT DETECTED Final   Stenotrophomonas maltophilia NOT DETECTED NOT DETECTED Final   Candida albicans NOT DETECTED NOT DETECTED Final   Candida auris NOT DETECTED NOT DETECTED Final   Candida glabrata NOT DETECTED NOT DETECTED Final   Candida krusei NOT DETECTED NOT DETECTED Final   Candida parapsilosis NOT DETECTED NOT DETECTED Final   Candida tropicalis NOT DETECTED NOT DETECTED Final   Cryptococcus neoformans/gattii NOT DETECTED NOT DETECTED Final    Comment: Performed at [facility], [address] [place], [place] [address]  Urine culture     Status: Abnormal (Preliminary result)   Collection Time: 05/30/21  9:53 AM   Specimen: Urine, Random  Result Value Ref Range Status   Specimen Description URINE, RANDOM  Final   Special Requests NONE  Final   Culture (A)  Final    10,000 COLONIES/mL ESCHERICHIA COLI SUSCEPTIBILITIES TO FOLLOW Performed at [facility], [address], [place], [place] [address]    Report Status PENDING  Incomplete  Resp Panel by RT-PCR (Flu A&B, Covid) Urine, Catheterized     Status: None   Collection Time: 05/30/21 10:30 AM   Specimen: Urine, Catheterized; Nasopharyngeal(NP) swabs in vial transport medium  Result Value Ref Range Status   SARS Coronavirus 2 by RT PCR NEGATIVE NEGATIVE Final    Comment: (NOTE) SARS-CoV-2 target nucleic acids are NOT DETECTED.  The SARS-CoV-2 RNA is generally detectable in upper respiratory specimens during the acute phase of infection. The lowest concentration of SARS-CoV-2 viral copies this assay can detect is 138 copies/mL. A negative result does not preclude SARS-Cov-2 infection and should not be used  as the sole basis for treatment or other patient management decisions. A negative result may  occur with  improper specimen collection/handling, submission of specimen other than nasopharyngeal swab, presence of viral mutation(s) within the areas targeted by this assay, and inadequate number of viral copies(<138 copies/mL). A negative result must be combined with clinical observations, patient history, and epidemiological information. The expected result is Negative.  Fact Sheet for Patients:  BloggerCourse.com  Fact Sheet for Healthcare Providers:  SeriousBroker.it  This test is no t yet approved or cleared by the [place] FDA and  has been authorized for detection and/or diagnosis of SARS-CoV-2 by FDA under an Emergency Use Authorization (EUA). This EUA will remain  in effect (meaning this test can be used) for the duration of the COVID-19 declaration under Section 564(b)(1) of the Act, 21 U.S.C.section 360bbb-3(b)(1), unless the authorization is terminated  or revoked sooner.       Influenza A by PCR NEGATIVE NEGATIVE Final   Influenza B by PCR NEGATIVE NEGATIVE Final    Comment: (NOTE) The Xpert Xpress SARS-CoV-2/FLU/RSV plus assay is intended as an aid in the diagnosis of influenza from Nasopharyngeal swab specimens and should not be used as a sole basis for treatment. Nasal washings and aspirates are unacceptable for Xpert Xpress SARS-CoV-2/FLU/RSV testing.  Fact Sheet for Patients: BloggerCourse.com  Fact Sheet for Healthcare Providers: SeriousBroker.it  This test is not yet approved or cleared by the [place] FDA and has been authorized for detection and/or diagnosis of SARS-CoV-2 by FDA under an Emergency Use Authorization (EUA). This EUA will remain in effect (meaning this test can be used) for the duration of the COVID-19 declaration under Section 564(b)(1)  of the Act, 21 U.S.C. section 360bbb-3(b)(1), unless the authorization is terminated or revoked.  Performed at [facility], [address], [place], [place] [address]   MRSA Next Gen by PCR, Nasal     Status: None   Collection Time: 05/30/21 11:39 PM   Specimen: Nasal Mucosa; Nasal Swab  Result Value Ref Range Status   MRSA by PCR Next Gen NOT DETECTED NOT DETECTED Final    Comment: (NOTE) The GeneXpert MRSA Assay (FDA approved for NASAL specimens only), is one component of a comprehensive MRSA colonization surveillance program. It is not intended to diagnose MRSA infection nor to guide or monitor treatment for MRSA infections. Test performance is not FDA approved in patients less than 6 years old. Performed at [facility], [address], [place], [place] [address]      Radiology Studies:  CT ANGIO HEAD NECK W WO CM  Result Date: 05/31/2021 CLINICAL DATA:  Stroke. Evaluate for intracranial atherosclerotic disease. EXAM: CT ANGIOGRAPHY HEAD AND NECK TECHNIQUE: Multidetector CT imaging of the head and neck was performed using the standard protocol during bolus administration of intravenous contrast. Multiplanar CT image reconstructions and MIPs were obtained to evaluate the vascular anatomy. Carotid stenosis measurements (when applicable) are obtained utilizing NASCET criteria, using the distal internal carotid diameter as the denominator. CONTRAST:  60mL OMNIPAQUE IOHEXOL 350 MG/ML SOLN COMPARISON:  CT angiogram of the head and neck October 10, 2020. FINDINGS: CT HEAD FINDINGS Brain: Hypodensity within the right basal ganglia region and corona radiata consistent with acute infarcts seen on recent MRI of the brain. Areas of encephalomalacia and gliosis in the right frontoparietal and occipital regions. Hypodensity of the periventricular white matter, nonspecific, most likely related to chronic small vessel ischemia. No hemorrhage, hydrocephalus, extra-axial collection or mass  lesion. Vascular: No hyperdense vessel or unexpected calcification. Skull: Normal. Negative for fracture or focal lesion.  Sinuses: Imaged portions are clear. Orbits: No acute finding. Review of the MIP images confirms the above findings CTA NECK FINDINGS Aortic arch: Standard branching. Imaged portion shows no evidence of aneurysm or dissection. Mild atherosclerotic changes of the aortic arch. No significant stenosis of the major arch vessel origins. Right carotid system: Atherosclerotic changes of the right carotid bifurcation without hemodynamically significant stenosis. Increased tortuosity of the cervical segment of the right ICA. Left carotid system: Atherosclerotic changes of the left carotid bifurcation without hemodynamically significant stenosis. Vertebral arteries: Dominant right vertebral artery with normal course and caliber. Mild luminal irregularities along the course of the non dominant left vertebral artery without hemodynamically significant stenosis in the neck. Skeleton: Degenerative changes of the cervical spine. Other neck: Mildly heterogeneous thyroid gland with a 6 mm hypodense right thyroid lobe nodule. Upper chest: No acute findings.  Left subclavian Port-A-Cath. Review of the MIP images confirms the above findings CTA HEAD FINDINGS Anterior circulation: Normal caliber of the intracranial internal carotid arteries. Luminal irregularities along the bilateral ACA and MCA vascular trees, consistent with intracranial atherosclerotic disease. New focal area of mild stenosis in the left M1 segment and moderate stenosis in M3 segments. Stable mild stenosis at the distal right M1 segment. Multifocal areas of severe stenosis at the right A2/ACA segment with occlusion at the A2-A3 junction with minimal opacification of distal branches. Severe stenosis at the left A3/ACA segment with diminutive caliber distally, worsened when compared to prior CTA. Posterior circulation: Atherosclerotic changes of the  right vertebral artery without hemodynamically significant stenosis. Atherosclerotic changes of the left vertebral artery with occlusion near the vertebrobasilar junction. The basilar artery is diminutive in caliber with luminal irregularity and mild stenosis at the mid basilar segment. A fetal right PCA is noted. Luminal irregularity of the bilateral posterior cerebral arteries with unchanged multifocal areas of severe stenosis at the right P2-P3 junction and P4 segments. Mild stenosis at the left V2 and V3 segments. Venous sinuses: As permitted by contrast timing, patent. Anatomic variants: Right fetal PCA. Review of the MIP images confirms the above findings IMPRESSION: 1. Hypodense foci within the right basal ganglia and corona radiata corresponding to acute infarct seen on recent MRI of the brain. 2. Remote right frontoparietal and occipital infarcts. 3. Mild atherosclerotic disease in the major vessels of the neck without hemodynamically significant stenosis. 4. Intracranial atherosclerotic disease, as described above, progressed from prior CT angiogram. New moderate stenosis at left M3/MCA segments with stable mild bilateral M1 segment stenosis. 5. 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 6. Severe stenosis at left A3 ACA segment with diminutive caliber distally, worsened when compared to prior CTA. 7. New occlusion of the distal left vertebral artery at the vertebrobasilar junction. 8. Atherosclerotic disease of the bilateral posterior cerebral arteries with multifocal areas of severe stenosis at the right PCA, unchanged. Electronically Signed   By: Baldemar Lenis M.D.   On: 05/31/2021 14:12   CT HEAD WO CONTRAST  Result Date: 05/30/2021 CLINICAL DATA:  Seizure EXAM: CT HEAD WITHOUT CONTRAST TECHNIQUE: Contiguous axial images were obtained from the base of the skull through the vertex without intravenous contrast. COMPARISON:  May 25, 2021 head CT and brain MRI FINDINGS: Brain: Note degree of motion artifact. Mild diffuse atrophy is stable. There is no intracranial mass, hemorrhage, extra-axial fluid collection, or midline shift. Evidence of prior infarct at the right parieto-occipital junction is stable. There are lacunar type infarcts in  the left lentiform nucleus as well as in each thalamus. There is decreased attenuation throughout portions of the centra semiovale bilaterally, stable. There is evidence of a prior infarct in the posterosuperior right frontal lobe. No appreciable acute infarct evident. Vascular: No hyperdense vessels evident. Calcification noted in each carotid siphon region. Skull: Bony calvarium appears intact. Sinuses/Orbits: Paranasal sinuses clear. Orbits symmetric bilaterally. Other: Mastoid air cells clear. IMPRESSION: There is a degree of motion artifact making this study less than optimal. Stable atrophy with prior infarcts at several sites, primarily on the right. There is periventricular small vessel disease. No acute infarct is demonstrated on this study. No mass or hemorrhage. Foci of arterial vascular calcification. Electronically Signed   By: Bretta Bang III M.D.   On: 05/30/2021 09:49   MR BRAIN WO CONTRAST  Result Date: 05/30/2021 CLINICAL DATA:  Neuro deficit, acute, stroke suspected. EXAM: MRI HEAD WITHOUT CONTRAST TECHNIQUE: Multiplanar, multiecho pulse sequences of the brain and surrounding structures were obtained without intravenous contrast. COMPARISON:  Prior head CT examinations 05/30/2021 and earlier. Brain MRI 05/25/2021. FINDINGS: Brain: Intermittently motion degraded examination, limiting evaluation. Most notably, there is severe motion degradation of the axial T1 weighted sequence, severe motion degradation of the sagittal T1 weighted sequence, moderate motion degradation of the axial T2 FLAIR sequence and severe motion degradation of the coronal T2 weighted sequence. Mild generalized  cerebral and cerebellar atrophy. The diffusion-weighted imaging is of good quality. Acute/early subacute infarction changes within the right corona radiata have increased in size as compared to the prior brain MRI of 05/25/2021, now spanning a region measuring 2.3 x 1.0 x 2.2 cm (AP x TV x CC). Additionally, there is a new subcentimeter acute infarct within the right external capsule (series 3, image 26). Redemonstrated chronic cortical/subcortical infarcts within the right frontal, parietal and occipital lobes. Redemonstrated chronic lacunar infarcts within the right cerebral hemispheric white matter, bilateral basal ganglia and bilateral thalami. Background moderate multifocal T2/FLAIR hyperintensity within the cerebral white matter, nonspecific but compatible with chronic small vessel ischemic disease. Tiny chronic lacunar infarct within the inferior left cerebellar hemisphere. No evidence of an intracranial mass. No chronic intracranial blood products. No extra-axial fluid collection. No midline shift. Vascular: Expected proximal arterial flow voids. Skull and upper cervical spine: No focal marrow lesion is identified within the limitations of motion degradation. Sinuses/Orbits: Visualized orbits show no acute finding. No significant paranasal sinus disease. IMPRESSION: Motion degraded examination, as described and limiting evaluation. Acute/early subacute infarction changes within the right corona radiata have increased in size from the brain MRI of 05/25/2021, now measuring 2.3 x 1.0 x 2.2 cm. Additionally, there is a new subcentimeter acute infarct within the right external capsule. Chronic cortical/subcortical infarcts within the right cerebral hemisphere, multiple chronic lacunar infarcts, background cerebral white matter chronic small vessel ischemic disease and generalized parenchymal atrophy, as detailed. Electronically Signed   By: Jackey Loge DO   On: 05/30/2021 14:20   DG Chest Portable 1  View  Result Date: 05/30/2021 CLINICAL DATA:  70 year old female with left side weakness and aphasia. Cough. EXAM: PORTABLE CHEST 1 VIEW COMPARISON:  Portable chest 05/25/2021. FINDINGS: Portable AP semi upright view at 1000 hours. Stable left chest Port-A-Cath. Mildly improved lung volumes. Mediastinal contours are stable and within normal limits. Thoracolumbar scoliosis. Allowing for portable technique the lungs are clear. Stable right axillary or chest wall surgical clips. No acute osseous abnormality identified. Negative visible bowel gas pattern. IMPRESSION: No acute cardiopulmonary abnormality. Electronically Signed   By:  Odessa Fleming M.D.   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  EEG to evaluate for seizures. Level of alertness: Awake, asleep AEDs during EEG study: Keppra Technical aspects: This EEG study was done with scalp electrodes positioned according to the 10-20 International system of electrode placement. Electrical activity was acquired at a sampling rate of  and reviewed with a high frequency filter of  and a low frequency filter of . EEG data were recorded continuously and digitally stored. Description: The posterior dominant rhythm consists of 8 Hz activity of moderate voltage (25-35 uV) seen predominantly in posterior head regions, symmetric and reactive to eye opening and eye closing. Sleep was characterized by vertex waves, sleep spindles (12 to 14 Hz), maximal frontocentral region.  EEG showed intermittent generalized and maximal left frontotemporal 3 to 6 Hz theta-delta  slowing. Hyperventilation and photic stimulation were not performed.   ABNORMALITY - Intermittent slow, generalized and maximal left frontotemporal region IMPRESSION: This study suggestive of nonspecific cortical dysfunction in left frontotemporal region.  Additionally there is mild diffuse encephalopathy, nonspecific etiology.  No seizures or definite epileptiform discharges were seen throughout the recording. Priyanka Annabelle Harman     Scheduled Meds:     stroke: mapping our early stages of recovery book   Does not apply Once   aspirin  81 mg Oral Daily   atorvastatin  40 mg Oral QHS   clopidogrel  75 mg Oral Daily   enoxaparin (LOVENOX) injection  40 mg Subcutaneous Q24H   mirtazapine  7.5 mg Oral QHS    Continuous Infusions:    levETIRAcetam 500 mg (05/31/21 1616)     LOS: 1 day     Marcellus Scott, MD, [place], [facility]. [facility]    To contact the attending provider between 7A-7P or the covering provider during after hours 7P-7A, please log into the web site [email or web address] and access using universal  password for that web site. If you do not have the password, please call the hospital operator.  05/31/2021, 5:19 PM

## 2021-05-31 NOTE — Plan of Care (Signed)
  Problem: Clinical Measurements: Goal: Will remain free from infection Outcome: Progressing Goal: Diagnostic test results will improve Outcome: Progressing Goal: Respiratory complications will improve Outcome: Progressing   Problem: Coping: Goal: Level of anxiety will decrease Outcome: Progressing   Problem: Elimination: Goal: Will not experience complications related to urinary retention Outcome: Progressing   Problem: Pain Managment: Goal: General experience of comfort will improve Outcome: Progressing   Problem: Safety: Goal: Ability to remain free from injury will improve Outcome: Progressing   Problem: Spontaneous Subarachnoid Hemorrhage Tissue Perfusion: Goal: Complications of Spontaneous Subarachnoid Hemorrhage will be minimized Outcome: Progressing

## 2021-05-31 NOTE — Progress Notes (Addendum)
1200 BP checked by NT Delayceo, 94/69, MD notified, orders for 500 NS bolus @250ml /hr received

## 2021-05-31 NOTE — Progress Notes (Addendum)
STROKE TEAM PROGRESS NOTE    Interval History  No acute events overnight, patient is resting in bed. No family at bedside.  Pertinent Lab Work and Imaging    05/30/21 CT Head WO IV Contrast There is a degree of motion artifact making this study less than optimal. Stable atrophy with prior infarcts at several sites, primarily on the right. There is periventricular small vessel disease. No acute infarct is demonstrated on this study. No mass or hemorrhage.  05/25/21 MR Angio Head and Neck WO Contrast  Intracranial atherosclerotic disease with multifocal stenoses, most notably as follows.   Progressive severe stenosis within the distal M1 right middle cerebral artery.   Redemonstrated multifocal severe stenoses within the A2 and A3 right anterior cerebral artery. Flow related enhancement is poorly appreciated within the right anterior cerebral artery distal to this. On the prior CTA of 10/10/2020, this portion of the right anterior cerebral artery demonstrated only minimal thready enhancement.   New severe stenosis versus artifact within the A3/A4 left anterior cerebral artery.   The non dominant intracranial left vertebral artery is irregular and poorly delineated beyond the left PICA origin. This is at least partially related to developmentally small vessel size. There may be superimposed high-grade stenosis.   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  05/30/21 MRI Brain WO IV Contrast Acute/early subacute infarction changes within the right corona radiata have increased in size from the brain MRI of 05/25/2021, now measuring 2.3 x 1.0 x 2.2 cm. Additionally, there is a new subcentimeter acute infarct within the right external capsule.   Chronic cortical/subcortical infarcts within the right cerebral hemisphere, multiple chronic lacunar infarcts, background cerebral white matter chronic small vessel ischemic disease and generalized parenchymal atrophy,  as detailed.  05/26/21 Echocardiogram Complete   1. Left ventricular ejection fraction, by estimation, is 55 to 60%. The left ventricle has normal function. The left ventricle has no regional wall motion abnormalities. There is mild left ventricular hypertrophy.  Left ventricular diastolic parameters are consistent with Grade I diastolic dysfunction (impaired relaxation). Elevated left atrial pressure.   2. Right ventricular systolic function is normal. The right ventricular size is normal.   3. The mitral valve is normal in structure. Trivial mitral valve regurgitation. No evidence of mitral stenosis.   4. The aortic valve is calcified. Aortic valve regurgitation is not visualized. No aortic stenosis is present.   Physical Examination   Constitutional: Calm, appropriate for condition  Cardiovascular: Normal RR Respiratory: No increased WOB   Mental status: Non verbal, follows simple commands such as close/open eyes and wiggles toes the left foot when asked  Speech: Global aphasia  Cranial nerves: Right gaze preference crosses midline, Decreased BTT on the left, right facial droop, does not stick out tongue when asked to assess CN12 Motor: Normal bulk and tone. She is unable to participate in strength testing. RUE 0/5 with increased tone noted. RLE also with increased tone, 0/5 LUE 2/5 LLE 2/5  Sensory: UTA, did not use noxious stimulation  Coordination: UTA due to limited ability to follow commands Gait: Deferred due to RLE and LLE weakness   Assessment and Plan   [name] is a 70 y.o. 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 She was outside the time window for IVTPA and was not a candidate for mechanical thrombectomy given poor baseline.   #  R Corona Radiata Stroke  She presents with the symptoms described above. Of note, patient is  familiar to the stroke service- she was last seen at [facility] on 6/23 for worsening right sided weakness and worsening of her aphasia- MRI at the time showed a new right corona radiata stroke that was felt to not correlate with her presenting sx then. She had a full stroke work up at that time. Vessel imaging w/MRA Head and Neck at the time was pertinent for ICAD( right M1, right and left ACAs). Echo w/EF 55 to 60 % and LA normal in size.   MRI Brain this admission shows an increase in size of the prior right corona radiata stroke. Stroke labs w/LDL 134, A1C 6.2. Her stroke etiology is cryptogenic but suspect possibly due to ICAD given notable stenosis on vessel imaging last admission. She had an MRA at that time- will obtain a CTA Head and Neck today to further evaluate ICAD. Depending on study results will potentially consult EP for a loop to rule out cardioembolic source of stroke via occult atrial fibrillation.  - Continue DAPT for secondary stroke prevention ( started 05/25/21, end 06/15/21) followed by Plavix monotherapy  - Increased Atorvastatin from 40 to 80 daily for better LDL control/stroke prevention purposes  - She follows in stroke clinic, will have her continue to follow at discharge   #Hypertension She has a history of HTN and takes Telmisartan 40 mg QD at home. Currently blood pressure is trending in the 110-130 range. Recommend permissive hypertension 48 hours post stroke and from there, gradually reduce the blood pressure, avoiding any acute drops. Long term blood pressure goal is < 140/90.  #Hyperlipidemia From a stroke prevention stand point, the LDL goal is < 70. Her LDL is 134. Home Atorvastatin 40 mg increased to 80 mg this admission.   #Stroke Diabetes Screening  #Prediabetes  Hemoglobin A1C this admission noted to be 6.2, at goal from a stroke reduction stand point given its < 7. She should follow up with her PCP outpatient for diabetes management.   Hospital day # 1  Stark Jock, NP  [facility] Nurse Practitioner Patient seen and discussed with attending physician Dr. Roda Shutters   ATTENDING NOTE: I reviewed above note and agree with the assessment and plan. Pt was seen and examined.   70 year old female with history of strokes with residual aphasia and right-sided weakness, seizure, breast cancer status post surgery, hypertension, hyperlipidemia admitted for left-sided weakness and nonverbal.  By reviewing previous chart extensively in [facility], patient had several strokes in the past. Prior stroke with left-sided weakness before 10/2020 10/2020 admitted for being found down.  MRI showed left thalamocapsular junction and the inferior left basal ganglia infarct.  CTA head and neck without significant ICA stenosis.  2D echo with bubble unremarkable, EF 60 to 65%.  A1c 6.5, LDL 171, TSH normal.  Continue aspirin Plavix DAPT for 3 months and high intensity statin.  On MRI, also showed additional focus of restricted diffusion and T2/flair hyperintensity within the medial right temporal lobe/hippocampus, concerning for stroke versus seizure.  EEG x3 generalized slowing but no seizure.  Keppra 500 twice daily started for seizure prophylaxis. 01/2021 followed with neurology at [facility], ordered MRI repeat to evaluate the abnormal focus of restriction diffusion and T2/flair hyperintensity at the right hippocampus.  MRI showed previous abnormal focus now encephalomalacia, supporting stroke diagnosis in 10/2020.  However, found to have a new small acute infarct involving the left  subinsular/temporal region.  Had cardiac event monitoring showed no A. fib. 05/25/2021 admitted for aphasia and right-sided weakness.  MRI showed right CR small infarcts.  MRA head and neck showed multifocal stenosis including right distal M1 stenosis, as well as right A2 and A3.  Stroke etiology considered for ICAD, EF 50%, A1c 6.2, started on DAPT for 3 weeks.  Continued Lipitor 40.   EEG negative for seizure, continue on Keppra.  This time patient admitted again for worsening left-sided weakness and nonverbal.  CT no acute abnormality.  MRI showed stroke extension of the right CR, but a new right insular cortex infarct.  CT head and neck showed left M3 moderate stenosis, mild bilateral M1 stenosis, right A2/A3 occlusion severe stenosis left A3, new occlusion distal left VA at VBJ.  Unchanged right PCA stenosis. LDL 134.  EEG no seizure.  UA WBC 6-10, creatinine 0.80.  EEG no seizure.  On exam, no family at the bedside, speech therapist is at the bedside. Pt eyes open, awake, but non verbal, concerning for anarthria. Follows commands with eye open/close and mouth opening and wiggle toes. Right gaze complete and left gaze incomplete. Blinking visual threat bilaterally. Significant right facial droop. Tongue protrusion not cooperative. Left UE proximal 1/5, bicep 3-/5, tricep and finger movement 0/5. LLE toe DF/PF 4/5, however, knee flexion and extension 0/5, iliopsoas 0/5. RUE no movement but with increased muscle tone and contracture at right wrist. RLE slight withdraw on pain and no movement of toes. Bilateral babinski positive. Sensation, coordination and gait not tested.    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  Still concerning for cardioembolic source, patient had a previous cardiac event monitoring, no A. fib found.  However, loop recorder will be more definitive.  However, patient current poor neurological condition and the previous baseline indicate patient not a good candidate for loop recorder at this moment.   Patient did not pass swallow, currently on aspirin 300 PR.  Once p.o. access, recommend DAPT and Lipitor.  Given LDL 134 this time, recommend Lipitor increased from 40-80.  Patient no clear seizure in the previous admissions, Keppra was  indicated for seizure prevention, given patient lethargy and poor neuro condition, will consider DC Keppra.  We will follow  For detailed assessment and plan, please refer to above as I have made changes wherever appropriate.   Marvel Plan, MD PhD Stroke Neurology 05/31/2021 1:58 PM  I spent  35 minutes in total face-to-face time with the patient, more than 50% of which was spent in counseling and coordination of care, reviewing test results, images and medication, and discussing the diagnosis, treatment plan and potential prognosis. This patient's care requiresreview of multiple databases, neurological assessment, discussion with family, other specialists and medical decision making of high complexity.       To contact Stroke Continuity provider, please refer to [email or web address]. After hours, contact General Neurology

## 2021-05-31 NOTE — Procedures (Signed)
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  EEG to evaluate for seizures.  Level of alertness: Awake, asleep  AEDs during EEG study: Keppra  Technical aspects: This EEG study was done with scalp electrodes positioned according to the 10-20 International system of electrode placement. Electrical activity was acquired at a sampling rate of 500Hz  and reviewed with a high frequency filter of 70Hz  and a low frequency filter of 1Hz . EEG data were recorded continuously and digitally stored.   Description: The posterior dominant rhythm consists of 8 Hz activity of moderate voltage (25-35 uV) seen predominantly in posterior head regions, symmetric and reactive to eye opening and eye closing. Sleep was characterized by vertex waves, sleep spindles (12 to 14 Hz), maximal frontocentral region.  EEG showed intermittent generalized and maximal left frontotemporal 3 to 6 Hz theta-delta slowing. Hyperventilation and photic stimulation were not performed.     ABNORMALITY - Intermittent slow, generalized and maximal left frontotemporal region  IMPRESSION: This study suggestive of nonspecific cortical dysfunction in left frontotemporal region.  Additionally there is mild diffuse encephalopathy, nonspecific etiology.  No seizures or definite epileptiform discharges were seen throughout the recording.  Mechille Varghese 

## 2021-05-31 NOTE — Plan of Care (Signed)
  Problem: Education: Goal: Knowledge of General Education information will improve Description: Including pain rating scale, medication(s)/side effects and non-pharmacologic comfort measures Outcome: Progressing   Problem: Health Behavior/Discharge Planning: Goal: Ability to manage health-related needs will improve Outcome: Progressing   Problem: Clinical Measurements: Goal: Ability to maintain clinical measurements within normal limits will improve Outcome: Progressing Goal: Will remain free from infection Outcome: Progressing Goal: Diagnostic test results will improve Outcome: Progressing Goal: Respiratory complications will improve Outcome: Progressing Goal: Cardiovascular complication will be avoided Outcome: Progressing   Problem: Activity: Goal: Risk for activity intolerance will decrease Outcome: Progressing   Problem: Nutrition: Goal: Adequate nutrition will be maintained Outcome: Progressing   Problem: Coping: Goal: Level of anxiety will decrease Outcome: Progressing   Problem: Elimination: Goal: Will not experience complications related to bowel motility Outcome: Progressing Goal: Will not experience complications related to urinary retention Outcome: Progressing   Problem: Pain Managment: Goal: General experience of comfort will improve Outcome: Progressing   Problem: Safety: Goal: Ability to remain free from injury will improve Outcome: Progressing   Problem: Skin Integrity: Goal: Risk for impaired skin integrity will decrease Outcome: Progressing   Problem: Education: Goal: Knowledge of disease or condition will improve Outcome: Progressing Goal: Knowledge of secondary prevention will improve Outcome: Progressing Goal: Knowledge of patient specific risk factors addressed and post discharge goals established will improve Outcome: Progressing   Problem: Nutrition: Goal: Risk of aspiration will decrease Outcome: Progressing Goal: Dietary intake  will improve Outcome: Progressing   Problem: Ischemic Stroke/TIA Tissue Perfusion: Goal: Complications of ischemic stroke/TIA will be minimized Outcome: Progressing   Problem: Spontaneous Subarachnoid Hemorrhage Tissue Perfusion: Goal: Complications of Spontaneous Subarachnoid Hemorrhage will be minimized Outcome: Progressing

## 2021-06-01 ENCOUNTER — Inpatient Hospital Stay (HOSPITAL_COMMUNITY): Payer: Medicare HMO

## 2021-06-01 DIAGNOSIS — R1310 Dysphagia, unspecified: Secondary | ICD-10-CM

## 2021-06-01 LAB — URINE CULTURE: Culture: 10000 — AB

## 2021-06-01 MED ORDER — ORAL CARE MOUTH RINSE
15.0000 mL | Freq: Two times a day (BID) | OROMUCOSAL | Status: DC
  Administered 2021-06-02 – 2021-06-05 (×5): 15 mL via OROMUCOSAL

## 2021-06-01 MED ORDER — CHLORHEXIDINE GLUCONATE 0.12 % MT SOLN
15.0000 mL | Freq: Two times a day (BID) | OROMUCOSAL | Status: DC
  Administered 2021-06-01 – 2021-06-05 (×8): 15 mL via OROMUCOSAL
  Filled 2021-06-01 (×9): qty 15

## 2021-06-01 NOTE — Consult Note (Signed)
Palliative Care Consult Note                                  Date: 06/01/2021   Patient Name: Brandi Stewart  DOB: 08/18/1951  MRN: [id number]  Age / Sex: 70 y.o., female  PCP: Pcp, No Referring Physician: Elease Etienne, MD  Reason for Consultation: Establishing goals of care  HPI/Patient Profile: 70 y.o. 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 Code stroke was called, not a tPA candidate (outside window).  Last "normal" was the night prior to presentation. She was able to communicate with family last night, but this AM was acutely non verbal and EMS was called. She has been on ASA and clopidogrel for stroke prophylaxis. Son also state she was able to feed herself last night, but her fine motor skills were not as good as they have been.    She was just admitted to [facility] on 6/23 for acute worsening of right sided weakness and aphasia and found to have an acute subcortical infarct. Neurology followed along and felt this was unrelated to presenting complaints. She has been seen at [facility] for multiple previous strokes as well. In November 2021 she had an acute left thalamoscapuslar junction and left basal ganglia infarct and imaging showed a chronic right MCA territory infarct. EEGs were also obtained without evidence of seizures, but she was placed on Keppra for seizure prophylaxis. She continues to be on this.  Failed swallow evaluation, planned MBSS today. OT consulted: total assist most functions. PT consulted: Total assist, following <25% commands. Both recommended SNF query rehab potential vs. Long term care.  Past Medical History:  Diagnosis Date  . Cognitive communication deficit   . CVA (cerebral vascular accident) (HCC)    . Dyslipidemia   . Dysphagia following unspecified cerebrovascular disease   . Dysphagia, oropharyngeal phase   . Essential hypertension   . Hemiplegia, unspecified affecting right dominant side (HCC)   . Muscle weakness (generalized)   . Other abnormalities of gait and mobility   . Personal history of malignant neoplasm of breast     Social History   Socioeconomic History  . Marital status: Single    Spouse name: Not on file  . Number of children: Not on file  . Years of education: Not on file  . Highest education level: Not on file  Occupational History  . Occupation: retired  Tobacco Use  . Smoking status: Never  . Smokeless tobacco: Never  Substance and Sexual Activity  . Alcohol use: Not Currently  . Drug use: Not Currently  . Sexual activity: Not on file  Other Topics Concern  . Not on file  Social History Narrative  . Not on file   Social Determinants of Health   Financial Resource Strain: Not on file  Food Insecurity: Not on file  Transportation Needs: Not on file  Physical Activity: Not on file  Stress: Not on file  Social Connections: Not on file    No family history on file.  Subjective:   This NP Wynne Dust reviewed medical records, received report from team, assessed the patient and then meet at the patient's bedside  to discuss diagnosis, prognosis, GOC, EOL wishes disposition  and options.   Concept of Palliative Care was introduced as specialized medical care for people and their families living with serious illness.  If focuses on providing relief from the symptoms and stress of a serious illness.  The goal is to improve quality of life for both the patient and the family. Values and goals of care important to patient and family were attempted to be elicited.  Created space and opportunity for patient and family to explore thoughts and feelings regarding current medical situation   Natural trajectory and expectations at EOL were discussed.  Questions and concerns addressed. Patient  encouraged to call with questions or concerns.    Patient non-verbal, not following commands and therefore unable to participate in visit.   I called and left a message for the patient's son Lian Pounds who called back early afternoon. States he and his mom hadn't had discussions about health, wishes/goals. Presented the idea of code status and other decisions, such as tube feeds. Emphasized no decisions needed today, but it would be good to start thinking about these. Scheduled a family meeting for tomorrow at 1:00 pm for further discussions.  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  She taught third grade.  She has been retired and focuses on hobbies including arts and crafts, such as painting and drawing, going to the gym, exercising, scrapbooking.  She seems to previously have been quite independent.  Patient/Family Understanding of Illness: Her son understands that prior to November she was very independent and able to care for herself.  This is her fourth stroke.  After for stroke she bounced back nicely and was also independent, regained her driver's license, continued to go to the gym for exercise and to church.  Her second stroke in November 2021 with associated seizures resulted in significant functional decline and she was discharged to a SNF for rehab.  Most recently her third stroke a few weeks ago had more progressive decline associated and was again discharged back to SNF for rehab.  He understands with her current stroke that she has had another small stroke in a different area of the brain.  He also agreed that he was told that her previous stroke from earlier this month had gotten bigger.  He states he had a good conversation with the nurse last night who also relayed  the MRI results.  He notes that when he saw her yesterday he does notice worsening left-sided deficits.  He also knows that she is not able to communicate at this time and that she really only grunts.  Review of Systems  Unable to perform ROS: Other : non-verbal, not following commands  Objective:   Primary Diagnoses: Present on Admission: . Acute left hemiparesis (HCC) . Prediabetes . Acute CVA (cerebrovascular accident) (HCC)   Scheduled Meds: .  stroke: mapping our early stages of recovery book   Does not apply Once  . aspirin  300 mg Rectal Daily  . atorvastatin  40 mg Oral QHS  . clopidogrel  75 mg Oral Daily  . enoxaparin (LOVENOX) injection  40 mg Subcutaneous Q24H  . mirtazapine  7.5 mg Oral QHS    Continuous Infusions: . lactated ringers 50 mL/hr at 05/31/21 2200    PRN Meds: acetaminophen **OR** acetaminophen (TYLENOL) oral liquid 160 mg/5 mL **OR** acetaminophen, albuterol, melatonin  Allergies  Allergen Reactions  . Other     Statins-HMG-coA- Reductase Inhibitors  Per MAR  . Statins Other (See Comments)    Not on MAR    Physical Exam Constitutional:      General: She is not in acute distress.    Appearance: She is normal weight. She is not toxic-appearing.  HENT:     Head: Normocephalic and atraumatic.  Eyes:     General: Gaze aligned appropriately.     Comments: Tracks provider when changing patient sides  Cardiovascular:     Rate and Rhythm: Normal rate.  Pulmonary:     Effort: Pulmonary effort is normal.  Abdominal:     General: Abdomen is flat.     Palpations: Abdomen is soft.     Tenderness: There is no abdominal tenderness (No objective signs of tenderness with palpation).  Skin:    General: Skin is warm and dry.  Neurological:     Mental Status: She is alert.     Motor: Weakness present.     Comments: Not following commands, not moving extremities    Vital Signs:  BP (!) 111/56 (BP Location: Left Arm)   Pulse 60   Temp 98.6 F (37  C) (Oral)   Resp 14   Ht 5\' 6"  (1.676 m)   Wt 76.2 kg   SpO2 100%   BMI 27.11 kg/m  Pain Scale: 0-10   Pain Score: 0-No pain  SpO2: SpO2: 100 % O2 Device:SpO2: 100 % O2 Flow Rate: .O2 Flow Rate (L/min): 0 L/min  IO: Intake/output summary:  Intake/Output Summary (Last 24 hours) at 06/01/2021 0916 Last data filed at 05/31/2021 1900 Gross per 24 hour  Intake 102.06 ml  Output 550 ml  Net -447.94 ml    LBM: Last BM Date: 05/31/21 Baseline Weight: Weight: 77.1 kg Most recent weight: Weight: 76.2 kg      Palliative Assessment/Data: 10-20%   Advanced Care Planning:   Primary Decision Maker: NEXT OF KIN  Code Status/Advance Care Planning: Full code  A discussion was had today regarding advanced directives. Concepts specific to code status, artifical feeding and hydration, continued IV antibiotics and rehospitalization was had.  The difference between a aggressive medical intervention path and a palliative comfort care path for this patient at this time was had.  Concepts of the MOST form, specifically tube feeding, was introduced and discussed but deferred further discussion and decisions to family meeting tomorrow.  Decisions/Changes to ACP: None at this time, hopeful for better clarity and decision making tomorrow at the family meeting.  Assessment & Plan:   I have reviewed the medical record, interviewed the patient and family, and examined the patient. The following aspects are pertinent.  Impression: 70 year old female with a history of multiple prior strokes recently admitted on the 23rd of this month for 2 days for CVA.  Presented with acute nonverbal status and worsening weakness/decreased function.  Identified extension of previous/recent stroke and new subcentimeter acute CVA in the external capsule, as noted and imaging results. Today the patient does track the provider with her eyes, is not following commands, unable to elicit "to blink for yes" to simple  questions.  Either not following commands to squeeze hands versus profound weakness and unable. Failed bedside swallow eval, pending modified barium swallow study.  The results of this will have large implications on prognostication and need to discuss GOC/wishes with family (ie- desire for feeding tube?) PT and OT consulted and appears she may eventually require long-term  care for total assistance.  May be some rehab potential depending on clinical progress over the next couple days  SUMMARY OF RECOMMENDATIONS   Continue current care Anticipate speech therapy evaluation today for dysphagia which may limit options Scheduled family meeting for tomorrow at 1:00 PM Further discussion and decisions at that time.  Symptom Management:  Per attending, no overt symptom management needs identified at this time.  Palliative Prophylaxis:  Frequent Pain Assessment, Oral Care, and Turn Reposition  Additional Recommendations (Limitations, Scope, Preferences): Full Scope Treatment  Psycho-social/Spiritual:  Desire for further Chaplaincy support: no Additional Recommendations: Caregiving  Support/Resources and Grief/Bereavement Support  Prognosis:  Unable to determine  Discharge Planning:  To Be Determined   Discussed with: Dr. Waymon AmatoHongalgi (attending), Dr. Roda ShuttersXu (Neurology)   Thank you for allowing us to participate in the care of Tawni LevyBrenton Steichen PMT will continue to support holistically.  Time In: 8:30 Time Out: 10:00 Time Total: 90 minutes  Greater than 50%  of this time was spent counseling and coordinating care related to the above assessment and plan.  Signed by: Lorinda CreedMary Itsel Opfer NP  I am in agreement with above assessment and plan and GOCs,  completed in collaboration with Wynne DustEric Gill NP,   Wynne DustEric Gill, NP Palliative Medicine Team  Team Phone # [phone] (Nights/Weekends)  06/01/2021, 9:16 AM

## 2021-06-01 NOTE — Progress Notes (Addendum)
STROKE TEAM PROGRESS NOTE   Interval History  No acute events overnight, patient is resting in bed. No family at bedside.  Pertinent Lab Work and Imaging    05/30/21 CT Head WO IV Contrast There is a degree of motion artifact making this study less than optimal. Stable atrophy with prior infarcts at several sites, primarily on the right. There is periventricular small vessel disease. No acute infarct is demonstrated on this study. No mass or hemorrhage.  05/25/21 MR Angio Head and Neck WO Contrast  Intracranial atherosclerotic disease with multifocal stenoses, most notably as follows.   Progressive severe stenosis within the distal M1 right middle cerebral artery.   Redemonstrated multifocal severe stenoses within the A2 and A3 right anterior cerebral artery. Flow related enhancement is poorly appreciated within the right anterior cerebral artery distal to this. On the prior CTA of 10/10/2020, this portion of the right anterior cerebral artery demonstrated only minimal thready enhancement.   New severe stenosis versus artifact within the A3/A4 left anterior cerebral artery.   The non dominant intracranial left vertebral artery is irregular and poorly delineated beyond the left PICA origin. This is at least partially related to developmentally small vessel size. There may be superimposed high-grade stenosis.   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  05/30/21 MRI Brain WO IV Contrast Acute/early subacute infarction changes within the right corona radiata have increased in size from the brain MRI of 05/25/2021, now measuring 2.3 x 1.0 x 2.2 cm. Additionally, there is a new subcentimeter acute infarct within the right external capsule.   Chronic cortical/subcortical infarcts within the right cerebral hemisphere, multiple chronic lacunar infarcts, background cerebral white matter chronic small vessel ischemic disease and generalized parenchymal atrophy, as  detailed.  05/26/21 Echocardiogram Complete   1. Left ventricular ejection fraction, by estimation, is 55 to 60%. The left ventricle has normal function. The left ventricle has no regional wall motion abnormalities. There is mild left ventricular hypertrophy.  Left ventricular diastolic parameters are consistent with Grade I diastolic dysfunction (impaired relaxation). Elevated left atrial pressure.   2. Right ventricular systolic function is normal. The right ventricular size is normal.   3. The mitral valve is normal in structure. Trivial mitral valve regurgitation. No evidence of mitral stenosis.   4. The aortic valve is calcified. Aortic valve regurgitation is not visualized. No aortic stenosis is present.   Physical Examination   Constitutional: Calm, appropriate for condition  Cardiovascular: Normal RR Respiratory: No increased WOB   Mental status: Non verbal, follows simple commands such as close/open eyes and wiggles toes the left foot when asked  Speech: Global aphasia  Cranial nerves: Right gaze preference crosses midline, Decreased BTT on the left, right facial droop, does not stick out tongue when asked to assess CN12 Motor: Normal bulk and tone. She is unable to participate in strength testing. RUE 0/5 with increased tone noted. RLE also with increased tone, 0/5 LUE 2/5 LLE 2/5  Sensory: UTA, did not use noxious stimulation  Coordination: UTA due to limited ability to follow commands Gait: Deferred due to RLE and LLE weakness   Assessment and Plan   Brandi Stewart is a 70 y.o. 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 She was outside the time window for IVTPA and was not a candidate for mechanical thrombectomy given poor baseline.   #  R Corona Radiata Stroke  She presents with the symptoms described above. Of note, patient is  familiar to the stroke service- she was last seen at [facility] on [facility] for worsening right sided weakness and worsening of her aphasia- MRI at the time showed a new right corona radiata stroke that was felt to not correlate with her presenting sx then. She had a full stroke work up at that time. Vessel imaging w/MRA Head and Neck at the time was pertinent for ICAD( right M1, right and left ACAs). Echo w/EF 55 to 60 % and LA normal in size.   MRI Brain this admission shows an increase in size of the prior right corona radiata stroke. Stroke labs w/LDL 134, A1C 6.2. Her stroke etiology is cryptogenic but suspect possibly due to ICAD given notable stenosis on vessel imaging last admission. She had an MRA at that time, CTA Head and Neck was done to further evaluate stenosis; this showed new moderate stenosis at the left M3 MA segments, severe stenosis at the left A3 ACA and new occlusion of the distal left vertebral artery when compared to prior CTA Head and Neck done in November of 2021.   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 Considered looping her however given poor prognosis ( she is debilitated due to her multiple strokes) ultimately decided not to consult EP for loop.  - Continue DAPT for secondary stroke prevention ( started 05/25/21, end 06/15/21) followed by Plavix monotherapy  - Increased Atorvastatin from 40 to 80 daily for better LDL control/stroke prevention purposes this admission  - She follows in stroke clinic, will have her continue to follow at discharge   #Hypertension She has a history of HTN and takes Telmisartan 40 mg QD at home. Currently blood pressure is trending in the 110-130 range. Recommend permissive hypertension 48 hours post stroke and from there, gradually reduce the blood pressure, avoiding any acute drops. Long term blood pressure goal is < 140/90.  #Hyperlipidemia From a stroke prevention stand point, the LDL goal is < 70. Her LDL is 134. Home  Atorvastatin 40 mg increased to 80 mg this admission.   #Stroke Diabetes Screening  #Prediabetes  Hemoglobin A1C this admission noted to be 6.2, at goal from a stroke reduction stand point given its < 7. She should follow up with her PCP outpatient for diabetes management.   Hospital day # 2  Stark Jock, NP  [facility] Nurse Practitioner Patient seen and discussed with attending physician Dr. Roda Shutters   ATTENDING NOTE: I reviewed above note and agree with the assessment and plan. Pt was seen and examined.   Sister and brother-in-law are at the bedside.  Patient lying in bed, eyes open, tracking bilaterally, however nonverbal not able to name or repeat.  Follows some simple commands such as eye closure, open mouth, left toes movement.  However did not follow simple commands.  Significant left facial droop.  Left upper extremity bicep 3-/5, otherwise no significant movement of all extremities.  Patient did not pass swallow, still n.p.o. at this time.  On IV fluid.  Still has aspirin 300 PR.  Pending family meeting with palliative care for goals of care tomorrow 1 PM.  If continue aggressive care core track with tube feeding need to be considered.  Once p.o. access, consider DAPT and Lipitor.  Keppra has been DC'd, so far no seizure activity.  We will follow.  For detailed assessment and plan, please refer to above as I  have made changes wherever appropriate.   Marvel Plan, MD PhD Stroke Neurology 06/01/2021 3:20 PM    To contact Stroke Continuity provider, please refer to [email or web address]. After hours, contact General Neurology

## 2021-06-01 NOTE — Progress Notes (Signed)
PROGRESS NOTE   Brandi Stewart  ZOX:[id number]    DOB: 24-Sep-1951    DOA: 05/30/2021  PCP: Pcp, No   I have briefly reviewed patients previous medical records in [facility].  Chief Complaint  Patient presents with   Code Stroke    Brief Narrative:   70 y.o. 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  She was recently hospitalized 05/25/2021 - 05/27/2021 when MRI brain revealed subcortical infarct, neurology recommended continuing dual antiplatelets x3 weeks then Plavix alone and continuing her Keppra.  Code stroke was called in the ED.  Patient admitted for recurrent acute stroke.  Failed swallow evaluation and MBS, remains n.p.o.  Neurology/stroke team consulted.  Palliative care consulted for goals of care-plan to meet with family on 7/1.   Assessment & Plan:  Principal Problem:   Acute left hemiparesis (HCC) Active Problems:   History of CVA with residual deficit   Prediabetes   Acute left-sided weakness   Acute CVA (cerebrovascular accident) (HCC)   Acute stroke with left hemiparesis and aphasia: History of multiple prior CVAs.  She now presented with left-sided weakness and nonverbal state.  Not a candidate for tPA or mechanical thrombectomy.  Neurology/stroke service consulted and their input appreciated.  She recently had a full stroke work-up.  MRI this admission shows increase in size of prior right corona radiator stroke.  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  Loop recorder considered however given poor prognosis, neurology  decided not to consult EP.  Continue aspirin 81 Mg daily (currently on aspirin 300 Mg PR while NPO) plus Plavix 75 Mg daily through 06/15/2021 (completes 3 weeks) followed by Plavix alone, increased atorvastatin from 40 to 80 mg daily, outpatient follow-up in stroke clinic.  PT and OT recommend return to SNF.  SLP input appreciated, failed swallow evaluation, and MBS, continue n.p.o. and IV fluids in the interim.  EEG shows encephalopathy features without seizures.  Given overall poor prognosis, consulted palliative care team who plan family meeting on 7/1 at 1 PM.  Essential hypertension/hypotension Hypotension resolved.  Blood pressures reasonable.  Holding antihypertensives.  Hyperlipidemia Atorvastatin increased from 40 mg daily to 80 mg daily this admission.  Follow fasting lipids and LFTs in 6 weeks.  All oral meds currently on hold due to n.p.o.  If family wishes to pursue aggressive course then will need alternate modes of enteral feed i.e. core track.  Prediabetes: A1c 6.2.  Outpatient follow-up.  Chronic diastolic CHF 2D echo 6/24: LVEF 55-60% and grade 1 diastolic dysfunction.  Clinically on the euvolemic or even dehydrated side.  Monitor while gently hydrating IV.  Dehydration with ketonuria and elevated lactate Gentle IV fluids and follow clinically.  Body mass index is 27.22 kg/m.  Adult failure to thrive Secondary to recurrent strokes and multiple medical problems.  Requested palliative care consult to address goals of care.   DVT prophylaxis: enoxaparin (LOVENOX) injection 40 mg Start: 05/30/21 2200     Code Status: Full Code Family Communication: None at bedside. Disposition:  Status is: Inpatient  Remains inpatient  appropriate because:Inpatient level of care appropriate due to severity of illness  Dispo: The patient is from: SNF              Anticipated d/c is to: SNF              Patient currently is not medically stable to d/c.   Difficult to place patient  No        Consultants:   Neurology Palliative care medicine  Procedures:   None  Antimicrobials:    Anti-infectives (From admission, onward)    None         Subjective:  Patient seen along with RN in the room.  Remains nonverbal.  No acute issues reported.  Objective:   Vitals:   06/01/21 0900 06/01/21 1121 06/01/21 1538 06/01/21 1543  BP:  (!) 116/54 (!) 158/65 (!) 158/65  Pulse:  (!) 53 (!) 58 68  Resp:  Temp:  98.3 F (36.8 C) 100.2 F (37.9 C) 100.2 F (37.9 C)  TempSrc:  Oral Oral Oral  SpO2:  99% 100% 98%  Weight: 76.5 kg     Height:        General exam: Elderly female, moderately built and nourished lying comfortably propped up in bed without distress. Respiratory system: Clear to auscultation.  No increased work of breathing. Cardiovascular system: S1-S2 heard, RRR.  No JVD, murmurs or pedal edema.  Telemetry personally reviewed: SR-SB in the 21s. Gastrointestinal system: Abdomen is nondistended, soft and nontender. No organomegaly or masses felt. Normal bowel sounds heard. Central nervous system: Alert this morning.  Tracking with eyes.  Intermittent humming sound/groaning unclear.  Nonverbal.  Does not follow instructions.  Facial asymmetry present.  Drooling from right angle of the mouth. Extremities: Not moving any limbs spontaneously or to touch even today.  Did not provide noxious stimuli.  All extremities with some edema, unclear if dependent from strokes. Skin: No rashes, lesions or ulcers Psychiatry: Judgement and insight impaired.  Mood & affect cannot be assessed    Data Reviewed:   I have personally reviewed following labs and imaging studies   CBC: Recent Labs  Lab 05/30/21 0933 05/30/21 0958  WBC  --  9.9  NEUTROABS  --  8.4*  HGB 14.3 13.3  HCT 42.0 42.9  MCV  --  91.7  PLT  --  363    Basic Metabolic Panel: Recent Labs  Lab 05/30/21 0930 05/30/21 0933 05/31/21 0343  NA 140 143 144  K 4.5 4.1 3.7  CL 108  110 114*  CO2 18*  --  21*  GLUCOSE 122* 125* 111*  BUN 20 25* 22  CREATININE 1.08* 0.80 0.77  CALCIUM 9.6  --  9.6    Liver Function Tests: Recent Labs  Lab 05/30/21 0930  AST 33  ALT 22  ALKPHOS 79  BILITOT 1.3*  PROT 7.2  ALBUMIN 3.7    CBG: Recent Labs  Lab 05/30/21 0929 05/31/21 0351  GLUCAP 86 104*    Microbiology Studies:   Recent Results (from the past 240 hour(s))  Resp Panel by RT-PCR (Flu A&B, Covid) Nasopharyngeal Swab     Status: None   Collection Time: 05/25/21  3:19 PM   Specimen: Nasopharyngeal Swab; Nasopharyngeal(NP) swabs in vial transport medium  Result Value Ref Range Status   SARS Coronavirus 2 by RT PCR NEGATIVE NEGATIVE Final    Comment: (NOTE) SARS-CoV-2 target nucleic acids are NOT DETECTED.  The SARS-CoV-2 RNA is generally detectable in  upper respiratory specimens during the acute phase of infection. The lowest concentration of SARS-CoV-2 viral copies this assay can detect is 138 copies/mL. A negative result does not preclude SARS-Cov-2 infection and should not be used as the sole basis for treatment or other patient management decisions. A negative result may occur with  improper specimen collection/handling, submission of specimen other than nasopharyngeal swab, presence of viral mutation(s) within the areas targeted by this assay, and inadequate number of viral copies(<138 copies/mL). A negative result must be combined with clinical observations, patient history, and epidemiological information. The expected result is Negative.  Fact Sheet for Patients:  BloggerCourse.com  Fact Sheet for Healthcare Providers:  SeriousBroker.it  This test is no t yet approved or cleared by the [place] FDA and  has been authorized for detection and/or diagnosis of SARS-CoV-2 by FDA under an Emergency Use Authorization (EUA). This EUA will remain  in effect (meaning this test can be used) for  the duration of the COVID-19 declaration under Section 564(b)(1) of the Act, 21 U.S.C.section 360bbb-3(b)(1), unless the authorization is terminated  or revoked sooner.       Influenza A by PCR NEGATIVE NEGATIVE Final   Influenza B by PCR NEGATIVE NEGATIVE Final    Comment: (NOTE) The Xpert Xpress SARS-CoV-2/FLU/RSV plus assay is intended as an aid in the diagnosis of influenza from Nasopharyngeal swab specimens and should not be used as a sole basis for treatment. Nasal washings and aspirates are unacceptable for Xpert Xpress SARS-CoV-2/FLU/RSV testing.  Fact Sheet for Patients: BloggerCourse.com  Fact Sheet for Healthcare Providers: SeriousBroker.it  This test is not yet approved or cleared by the [place] FDA and has been authorized for detection and/or diagnosis of SARS-CoV-2 by FDA under an Emergency Use Authorization (EUA). This EUA will remain in effect (meaning this test can be used) for the duration of the COVID-19 declaration under Section 564(b)(1) of the Act, 21 U.S.C. section 360bbb-3(b)(1), unless the authorization is terminated or revoked.  Performed at [facility], [address] [place], [place] [address]   Culture, blood (routine x 2)     Status: Abnormal   Collection Time: 05/25/21  8:57 PM   Specimen: BLOOD LEFT HAND  Result Value Ref Range Status   Specimen Description BLOOD LEFT HAND  Final   Special Requests   Final    BOTTLES DRAWN AEROBIC ONLY Blood Culture adequate volume   Culture  Setup Time   Final    AEROBIC BOTTLE ONLY GRAM POSITIVE COCCI CRITICAL RESULT CALLED TO, READ BACK BY AND VERIFIED WITH: L SEAY PHARMD 05/26/21 2303 JDW    Culture (A)  Final    STREPTOCOCCUS MITIS/ORALIS THE SIGNIFICANCE OF ISOLATING THIS ORGANISM FROM A SINGLE SET OF BLOOD CULTURES WHEN MULTIPLE SETS ARE DRAWN IS UNCERTAIN. PLEASE NOTIFY THE MICROBIOLOGY DEPARTMENT WITHIN ONE WEEK IF SPECIATION AND  SENSITIVITIES ARE REQUIRED. Performed at [facility], [address], [place], [place] [address]    Report Status 05/28/2021 FINAL  Final  Culture, blood (routine x 2)     Status: None   Collection Time: 05/25/21  8:57 PM   Specimen: BLOOD  Result Value Ref Range Status   Specimen Description BLOOD SITE NOT SPECIFIED  Final   Special Requests   Final    BOTTLES DRAWN AEROBIC AND ANAEROBIC Blood Culture adequate volume   Culture   Final    NO GROWTH 5 DAYS Performed at [facility], [address] [place], [place]  [id number]    Report Status 05/31/2021 FINAL  Final  Blood Culture ID Panel (Reflexed)     Status: Abnormal   Collection Time: 05/25/21  8:57 PM  Result Value Ref Range Status   Enterococcus faecalis NOT DETECTED NOT DETECTED Final   Enterococcus Faecium NOT DETECTED NOT DETECTED Final   Listeria monocytogenes NOT DETECTED NOT DETECTED Final   Staphylococcus species NOT DETECTED NOT DETECTED Final   Staphylococcus aureus (BCID) NOT DETECTED NOT DETECTED Final   Staphylococcus epidermidis NOT DETECTED NOT DETECTED Final   Staphylococcus lugdunensis NOT DETECTED NOT DETECTED Final   Streptococcus species DETECTED (A) NOT DETECTED Final    Comment: Not Enterococcus species, Streptococcus agalactiae, Streptococcus pyogenes, or Streptococcus pneumoniae. CRITICAL RESULT CALLED TO, READ BACK BY AND VERIFIED WITH: L SEAY PHARMD 05/26/21 2303 JDW    Streptococcus agalactiae NOT DETECTED NOT DETECTED Final   Streptococcus pneumoniae NOT DETECTED NOT DETECTED Final   Streptococcus pyogenes NOT DETECTED NOT DETECTED Final   A.calcoaceticus-baumannii NOT DETECTED NOT DETECTED Final   Bacteroides fragilis NOT DETECTED NOT DETECTED Final   Enterobacterales NOT DETECTED NOT DETECTED Final   Enterobacter cloacae complex NOT DETECTED NOT DETECTED Final   Escherichia coli NOT DETECTED NOT DETECTED Final   Klebsiella aerogenes NOT DETECTED NOT DETECTED Final   Klebsiella  oxytoca NOT DETECTED NOT DETECTED Final   Klebsiella pneumoniae NOT DETECTED NOT DETECTED Final   Proteus species NOT DETECTED NOT DETECTED Final   Salmonella species NOT DETECTED NOT DETECTED Final   Serratia marcescens NOT DETECTED NOT DETECTED Final   Haemophilus influenzae NOT DETECTED NOT DETECTED Final   Neisseria meningitidis NOT DETECTED NOT DETECTED Final   Pseudomonas aeruginosa NOT DETECTED NOT DETECTED Final   Stenotrophomonas maltophilia NOT DETECTED NOT DETECTED Final   Candida albicans NOT DETECTED NOT DETECTED Final   Candida auris NOT DETECTED NOT DETECTED Final   Candida glabrata NOT DETECTED NOT DETECTED Final   Candida krusei NOT DETECTED NOT DETECTED Final   Candida parapsilosis NOT DETECTED NOT DETECTED Final   Candida tropicalis NOT DETECTED NOT DETECTED Final   Cryptococcus neoformans/gattii NOT DETECTED NOT DETECTED Final    Comment: Performed at [facility], [address] [place], [place] [id number]  Urine culture     Status: Abnormal   Collection Time: 05/30/21  9:53 AM   Specimen: Urine, Random  Result Value Ref Range Status   Specimen Description URINE, RANDOM  Final   Special Requests   Final    NONE Performed at [facility], [address] [place], [place] [id number]    Culture 10,000 COLONIES/mL ESCHERICHIA COLI (A)  Final   Report Status 06/01/2021 FINAL  Final   Organism ID, Bacteria ESCHERICHIA COLI (A)  Final      Susceptibility   Escherichia coli - MIC*    AMPICILLIN <=2 SENSITIVE Sensitive     CEFAZOLIN <=4 SENSITIVE Sensitive     CEFEPIME <=0.12 SENSITIVE Sensitive     CEFTRIAXONE <=0.25 SENSITIVE Sensitive     CIPROFLOXACIN <=0.25 SENSITIVE Sensitive     GENTAMICIN <=1 SENSITIVE Sensitive     IMIPENEM <=0.25 SENSITIVE Sensitive     NITROFURANTOIN <=16 SENSITIVE Sensitive     TRIMETH/SULFA <=20 SENSITIVE Sensitive     AMPICILLIN/SULBACTAM <=2 SENSITIVE Sensitive     PIP/TAZO <=4 SENSITIVE Sensitive     * 10,000  COLONIES/mL ESCHERICHIA COLI  Resp Panel by RT-PCR (Flu A&B, Covid) Urine, Catheterized     Status: None   Collection Time:  05/30/21 10:30 AM   Specimen: Urine, Catheterized; Nasopharyngeal(NP) swabs in vial transport medium  Result Value Ref Range Status   SARS Coronavirus 2 by RT PCR NEGATIVE NEGATIVE Final    Comment: (NOTE) SARS-CoV-2 target nucleic acids are NOT DETECTED.  The SARS-CoV-2 RNA is generally detectable in upper respiratory specimens during the acute phase of infection. The lowest concentration of SARS-CoV-2 viral copies this assay can detect is 138 copies/mL. A negative result does not preclude SARS-Cov-2 infection and should not be used as the sole basis for treatment or other patient management decisions. A negative result may occur with  improper specimen collection/handling, submission of specimen other than nasopharyngeal swab, presence of viral mutation(s) within the areas targeted by this assay, and inadequate number of viral copies(<138 copies/mL). A negative result must be combined with clinical observations, patient history, and epidemiological information. The expected result is Negative.  Fact Sheet for Patients:  BloggerCourse.com  Fact Sheet for Healthcare Providers:  SeriousBroker.it  This test is no t yet approved or cleared by the [place] FDA and  has been authorized for detection and/or diagnosis of SARS-CoV-2 by FDA under an Emergency Use Authorization (EUA). This EUA will remain  in effect (meaning this test can be used) for the duration of the COVID-19 declaration under Section 564(b)(1) of the Act, 21 U.S.C.section 360bbb-3(b)(1), unless the authorization is terminated  or revoked sooner.       Influenza A by PCR NEGATIVE NEGATIVE Final   Influenza B by PCR NEGATIVE NEGATIVE Final    Comment: (NOTE) The Xpert Xpress SARS-CoV-2/FLU/RSV plus assay is intended as an aid in the  diagnosis of influenza from Nasopharyngeal swab specimens and should not be used as a sole basis for treatment. Nasal washings and aspirates are unacceptable for Xpert Xpress SARS-CoV-2/FLU/RSV testing.  Fact Sheet for Patients: BloggerCourse.com  Fact Sheet for Healthcare Providers: SeriousBroker.it  This test is not yet approved or cleared by the [place] FDA and has been authorized for detection and/or diagnosis of SARS-CoV-2 by FDA under an Emergency Use Authorization (EUA). This EUA will remain in effect (meaning this test can be used) for the duration of the COVID-19 declaration under Section 564(b)(1) of the Act, 21 U.S.C. section 360bbb-3(b)(1), unless the authorization is terminated or revoked.  Performed at [facility], [address] [place], [place] [address]   MRSA Next Gen by PCR, Nasal     Status: None   Collection Time: 05/30/21 11:39 PM   Specimen: Nasal Mucosa; Nasal Swab  Result Value Ref Range Status   MRSA by PCR Next Gen NOT DETECTED NOT DETECTED Final    Comment: (NOTE) The GeneXpert MRSA Assay (FDA approved for NASAL specimens only), is one component of a comprehensive MRSA colonization surveillance program. It is not intended to diagnose MRSA infection nor to guide or monitor treatment for MRSA infections. Test performance is not FDA approved in patients less than 45 years old. Performed at [facility], [address] [place], [place] [address]      Radiology Studies:  CT ANGIO HEAD NECK W WO CM  Result Date: 05/31/2021 CLINICAL DATA:  Stroke. Evaluate for intracranial atherosclerotic disease. EXAM: CT ANGIOGRAPHY HEAD AND NECK TECHNIQUE: Multidetector CT imaging of the head and neck was performed using the standard protocol during bolus administration of intravenous contrast. Multiplanar CT image reconstructions and MIPs were obtained to evaluate the vascular anatomy. Carotid  stenosis measurements (when applicable) are obtained utilizing NASCET criteria, using the distal  internal carotid diameter as the denominator. CONTRAST:  60mL OMNIPAQUE IOHEXOL 350 MG/ML SOLN COMPARISON:  CT angiogram of the head and neck October 10, 2020. FINDINGS: CT HEAD FINDINGS Brain: Hypodensity within the right basal ganglia region and corona radiata consistent with acute infarcts seen on recent MRI of the brain. Areas of encephalomalacia and gliosis in the right frontoparietal and occipital regions. Hypodensity of the periventricular white matter, nonspecific, most likely related to chronic small vessel ischemia. No hemorrhage, hydrocephalus, extra-axial collection or mass lesion. Vascular: No hyperdense vessel or unexpected calcification. Skull: Normal. Negative for fracture or focal lesion. Sinuses: Imaged portions are clear. Orbits: No acute finding. Review of the MIP images confirms the above findings CTA NECK FINDINGS Aortic arch: Standard branching. Imaged portion shows no evidence of aneurysm or dissection. Mild atherosclerotic changes of the aortic arch. No significant stenosis of the major arch vessel origins. Right carotid system: Atherosclerotic changes of the right carotid bifurcation without hemodynamically significant stenosis. Increased tortuosity of the cervical segment of the right ICA. Left carotid system: Atherosclerotic changes of the left carotid bifurcation without hemodynamically significant stenosis. Vertebral arteries: Dominant right vertebral artery with normal course and caliber. Mild luminal irregularities along the course of the non dominant left vertebral artery without hemodynamically significant stenosis in the neck. Skeleton: Degenerative changes of the cervical spine. Other neck: Mildly heterogeneous thyroid gland with a 6 mm hypodense right thyroid lobe nodule. Upper chest: No acute findings.  Left subclavian Port-A-Cath. Review of the MIP images confirms the above findings  CTA HEAD FINDINGS Anterior circulation: Normal caliber of the intracranial internal carotid arteries. Luminal irregularities along the bilateral ACA and MCA vascular trees, consistent with intracranial atherosclerotic disease. New focal area of mild stenosis in the left M1 segment and moderate stenosis in M3 segments. Stable mild stenosis at the distal right M1 segment. Multifocal areas of severe stenosis at the right A2/ACA segment with occlusion at the A2-A3 junction with minimal opacification of distal branches. Severe stenosis at the left A3/ACA segment with diminutive caliber distally, worsened when compared to prior CTA. Posterior circulation: Atherosclerotic changes of the right vertebral artery without hemodynamically significant stenosis. Atherosclerotic changes of the left vertebral artery with occlusion near the vertebrobasilar junction. The basilar artery is diminutive in caliber with luminal irregularity and mild stenosis at the mid basilar segment. A fetal right PCA is noted. Luminal irregularity of the bilateral posterior cerebral arteries with unchanged multifocal areas of severe stenosis at the right P2-P3 junction and P4 segments. Mild stenosis at the left V2 and V3 segments. Venous sinuses: As permitted by contrast timing, patent. Anatomic variants: Right fetal PCA. Review of the MIP images confirms the above findings IMPRESSION: 1. Hypodense foci within the right basal ganglia and corona radiata corresponding to acute infarct seen on recent MRI of the brain. 2. Remote right frontoparietal and occipital infarcts. 3. Mild atherosclerotic disease in the major vessels of the neck without hemodynamically significant stenosis. 4. Intracranial atherosclerotic disease, as described above, progressed from prior CT angiogram. New moderate stenosis at left M3/MCA segments with stable mild bilateral M1 segment stenosis. 5. Occlusion of the right ACA at the A2-A3 junction with minimal opacification of distal  branches. 6. Severe stenosis at left A3 ACA segment with diminutive caliber distally, worsened when compared to prior CTA. 7. New occlusion of the distal left vertebral artery at the vertebrobasilar junction. 8. Atherosclerotic disease of the bilateral posterior cerebral arteries with multifocal areas of severe stenosis at the right PCA, unchanged. Electronically Signed  By: Baldemar Lenis M.D.   On: 05/31/2021 14:12   DG Swallowing Func-Speech Pathology  Result Date: 06/01/2021 Formatting of this result is different from the original. Objective Swallowing Evaluation: Type of Study: MBS-Modified Barium Swallow Study  Patient Details Name: Ambert Virrueta MRN: [id number] Date of Birth: Jul 23, 1951 Today's Date: 06/01/2021 Time: SLP Start Time (ACUTE ONLY): 1045 -SLP Stop Time (ACUTE ONLY): 1115 SLP Time Calculation (min) (ACUTE ONLY): 30 min Past Medical History: Past Medical History: Diagnosis Date  Cognitive communication deficit   CVA (cerebral vascular accident) (HCC)   Dyslipidemia   Dysphagia following unspecified cerebrovascular disease   Dysphagia, oropharyngeal phase   Essential hypertension   Hemiplegia, unspecified affecting right dominant side (HCC)   Muscle weakness (generalized)   Other abnormalities of gait and mobility   Personal history of malignant neoplasm of breast  Past Surgical History: No past surgical history on file. HPI: 70 y/o female recently discharged on 6/25 following newfound acute infarct within R corona radiata, now presented to ED on 6/28 with worsening L sided weakness and aphasia. MRI found R corona radiata stroke size is increased from 05/25/2021 and new subcentimeter acute infarct within R external capsule. Per neuro notes from recent admission, pt was able to speak with evidence of aphasia and dysarthria; followed basic commands. 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, HTN, HLD  Subjective: alert Assessment / Plan / Recommendation CHL  IP CLINICAL IMPRESSIONS 06/01/2021 Clinical Impression Pt presents with a severe oropharyngeal dysphagia marked by severely delayed if not absent swallow response and subsequent aspiration of materials (thin liquids and purees). Pt required max cues (tactile/verbal) to generate tongue movement to propel material to back of pharynx - barium spilled from lips and there were minimal attempts to achieve a cohesive bolus that could be transferred to throat. Aspiration occurred during and after the swallow due to residue that collected then spilled from the hypopharynx into the larynx. Aspiration did not elicit a cough response.  At this time, swallow is severely impaired.  Recommend continuing NPO; Palliative care consult is pending.  Family will benefit from discussion re: alternative nutrition vs a comfort approach to care. SLP will follow. SLP Visit Diagnosis Dysphagia, oropharyngeal phase (R13.12) Attention and concentration deficit following -- Frontal lobe and executive function deficit following -- Impact on safety and function Severe aspiration risk   [facility] IP TREATMENT RECOMMENDATION 06/01/2021 Treatment Recommendations Therapy as outlined in treatment plan below   Prognosis 06/01/2021 Prognosis for Safe Diet Advancement Guarded Barriers to Reach Goals -- Barriers/Prognosis Comment -- [facility] IP DIET RECOMMENDATION 06/01/2021 SLP Diet Recommendations NPO Liquid Administration via -- Medication Administration -- Compensations -- Postural Changes --   [facility] IP OTHER RECOMMENDATIONS 06/01/2021 Recommended Consults -- Oral Care Recommendations Oral care QID Other Recommendations --   [facility] IP FOLLOW UP RECOMMENDATIONS 06/01/2021 Follow up Recommendations Skilled Nursing facility   [facility] IP FREQUENCY AND DURATION 06/01/2021 Speech Therapy Frequency (ACUTE ONLY) min 3x week Treatment Duration 2 weeks      [facility] IP ORAL PHASE 06/01/2021 Oral Phase Impaired Oral - Pudding Teaspoon -- Oral - Pudding Cup -- Oral - Honey Teaspoon -- Oral -  Honey Cup -- Oral - Nectar Teaspoon -- Oral - Nectar Cup -- Oral - Nectar Straw -- Oral - Thin Teaspoon Left anterior bolus loss;Impaired mastication;Weak lingual manipulation;Incomplete tongue to palate contact;Reduced posterior propulsion;Holding of bolus;Left pocketing in lateral sulci;Pocketing in anterior sulcus;Lingual/palatal residue;Decreased bolus cohesion;Delayed oral transit Oral - Thin  Cup -- Oral - Thin Straw -- Oral - Puree Left anterior bolus loss;Weak lingual manipulation;Reduced posterior propulsion;Holding of bolus;Left pocketing in lateral sulci;Pocketing in anterior sulcus;Lingual/palatal residue;Delayed oral transit;Decreased bolus cohesion Oral - Mech Soft -- Oral - Regular -- Oral - Multi-Consistency -- Oral - Pill -- Oral Phase - Comment --  [facility] IP PHARYNGEAL PHASE 06/01/2021 Pharyngeal Phase Impaired Pharyngeal- Pudding Teaspoon -- Pharyngeal -- Pharyngeal- Pudding Cup -- Pharyngeal -- Pharyngeal- Honey Teaspoon -- Pharyngeal -- Pharyngeal- Honey Cup -- Pharyngeal -- Pharyngeal- Nectar Teaspoon -- Pharyngeal -- Pharyngeal- Nectar Cup -- Pharyngeal -- Pharyngeal- Nectar Straw -- Pharyngeal -- Pharyngeal- Thin Teaspoon Delayed swallow initiation-pyriform sinuses;Penetration/Aspiration before swallow;Penetration/Aspiration during swallow;Penetration/Apiration after swallow;Trace aspiration;Pharyngeal residue - pyriform;Pharyngeal residue - valleculae Pharyngeal Material enters airway, passes BELOW cords without attempt by patient to eject out (silent aspiration) Pharyngeal- Thin Cup -- Pharyngeal -- Pharyngeal- Thin Straw -- Pharyngeal -- Pharyngeal- Puree Reduced pharyngeal peristalsis;Penetration/Apiration after swallow;Trace aspiration;Pharyngeal residue - valleculae;Pharyngeal residue - pyriform Pharyngeal Material enters airway, passes BELOW cords without attempt by patient to eject out (silent aspiration) Pharyngeal- Mechanical Soft -- Pharyngeal -- Pharyngeal- Regular -- Pharyngeal --  Pharyngeal- Multi-consistency -- Pharyngeal -- Pharyngeal- Pill -- Pharyngeal -- Pharyngeal Comment --  No flowsheet data found. 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  EEG to evaluate for seizures. Level of alertness: Awake, asleep AEDs during EEG study: [place] Technical aspects: This EEG study was done with scalp electrodes positioned according to the 10-20 International system of electrode placement. Electrical activity was acquired at a sampling rate of 500Hz  and reviewed with a high frequency filter of 70Hz  and a low frequency filter of 1Hz . EEG data were recorded continuously and digitally stored. Description: The posterior dominant rhythm consists of 8 Hz activity of moderate voltage (25-35 uV) seen predominantly in posterior head regions, symmetric and reactive to eye opening and eye closing. Sleep was characterized by vertex waves, sleep spindles (12 to 14 Hz), maximal frontocentral region.  EEG showed intermittent generalized and maximal left frontotemporal 3 to 6 Hz theta-delta slowing. Hyperventilation and photic stimulation were not performed.   ABNORMALITY - Intermittent slow, generalized and maximal left frontotemporal region IMPRESSION: This study suggestive of nonspecific cortical dysfunction in left frontotemporal region.  Additionally there is mild diffuse encephalopathy, nonspecific etiology.  No seizures or definite epileptiform discharges were seen throughout the recording. Priyanka     Scheduled Meds:     stroke: mapping our early  stages of recovery book   Does not apply Once   aspirin  300 mg Rectal Daily   atorvastatin  40 mg Oral QHS   chlorhexidine  15 mL Mouth Rinse BID   clopidogrel  75 mg Oral Daily   enoxaparin (LOVENOX) injection  40 mg Subcutaneous Q24H   [START ON 06/02/2021] mouth rinse  15 mL Mouth Rinse q12n4p   mirtazapine  7.5 mg Oral QHS    Continuous Infusions:    lactated ringers 50 mL/hr at 05/31/21 2200     LOS: 2 days     Annabelle Harman, MD, [place],  [facility]. [facility]    To contact the attending provider between 7A-7P or the covering provider during after hours 7P-7A, please log into the web site [email or web address] and access using universal [place] password for that web site. If you do not have the password, please call the hospital operator.  06/01/2021, 6:08 PM

## 2021-06-01 NOTE — Progress Notes (Signed)
Modified Barium Swallow Progress Note  Patient Details  Name: Brandi Stewart MRN: [id number] Date of Birth: 01/13/1951  Today's Date: 06/01/2021  Modified Barium Swallow completed.  Full report located under Chart Review in the Imaging Section.  Brief recommendations include the following:  Clinical Impression  Pt presents with a severe oropharyngeal dysphagia marked by severely delayed if not absent swallow response and subsequent aspiration of materials (thin liquids and purees). Pt required max cues (tactile/verbal) to generate tongue movement to propel material to back of pharynx - barium spilled from lips and there were minimal attempts to achieve a cohesive bolus that could be transferred to throat. Aspiration occurred during and after the swallow due to residue that collected then spilled from the hypopharynx into the larynx. Aspiration did not elicit a cough response.  At this time, swallow is severely impaired.  Recommend continuing NPO; Palliative care consult is pending.  Family will benefit from discussion re: alternative nutrition vs a comfort approach to care. SLP will follow.   Swallow Evaluation Recommendations       SLP Diet Recommendations: NPO                       Oral Care Recommendations: Oral care QID      Brandi Cassara L. Samson Frederic, MA CCC/SLP Acute Rehabilitation Services Office number [phone] Pager [phone]   Brandi Stewart 06/01/2021,2:31 PM

## 2021-06-02 DIAGNOSIS — I639 Cerebral infarction, unspecified: Secondary | ICD-10-CM

## 2021-06-02 DIAGNOSIS — G8194 Hemiplegia, unspecified affecting left nondominant side: Secondary | ICD-10-CM

## 2021-06-02 DIAGNOSIS — Z7189 Other specified counseling: Secondary | ICD-10-CM

## 2021-06-02 DIAGNOSIS — Z515 Encounter for palliative care: Secondary | ICD-10-CM

## 2021-06-02 NOTE — Progress Notes (Signed)
This chaplain attempted PMT spiritual care visit with the Pt. sons. The sons are not at the bedside at this time. This chaplain will F/U at the best time for the Pt., family, and chaplain.

## 2021-06-02 NOTE — Progress Notes (Addendum)
PROGRESS NOTE   Brandi Stewart  KZL:[id number]    DOB: 1950/12/30    DOA: 05/30/2021  PCP: Pcp, No   I have briefly reviewed patients previous medical records in [facility].  Chief Complaint  Patient presents with   Code Stroke    Brief Narrative:   70 y.o. 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  She was recently hospitalized 05/25/2021 - 05/27/2021 when MRI brain revealed subcortical infarct, neurology recommended continuing dual antiplatelets x3 weeks then Plavix alone and continuing her Keppra.  Code stroke was called in the ED.  Patient admitted for recurrent acute stroke.  Failed swallow evaluation and MBS, remains n.p.o.  Neurology/stroke team consulted.  Palliative care consulted for goals of care-met with family 7/1, no artificial feeding, family wish to think over regarding CODE STATUS etc.   Assessment & Plan:  Principal Problem:   Acute left hemiparesis ([place]) Active Problems:   History of CVA with residual deficit   Prediabetes   Acute left-sided weakness   Acute CVA (cerebrovascular accident) ([place])   Acute stroke with left hemiparesis and aphasia: History of multiple prior CVAs.  She now presented with left-sided weakness and nonverbal state.  Not a candidate for tPA or mechanical thrombectomy.  Neurology/stroke service consulted and their input appreciated.  She recently had a full stroke work-up.  MRI this admission shows increase in size of prior right corona radiator stroke.  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   Loop recorder considered however given poor prognosis, neurology decided not to consult EP.  Continue aspirin 81 Mg daily (currently on aspirin 300 Mg PR while NPO) plus Plavix 75 Mg daily through 06/15/2021 (completes 3 weeks) followed by Plavix alone, increased atorvastatin from 40 to 80 mg daily, outpatient follow-up in stroke clinic.  PT and OT recommend return to SNF.  SLP input appreciated, failed swallow evaluation, and MBS, continue n.p.o. and IV fluids in the interim.  EEG shows encephalopathy features without seizures.  Given overall poor prognosis, consulted palliative care team who met with the family along with neurology.  No artificial feeding at this time.  Family will update with further decisions i.e. CODE STATUS etc. after thinking it over.  Palliative care will continue to follow.  Acute encephalopathy: Acute on chronic encephalopathy related to multiple strokes.  Essential hypertension/hypotension Hypotension resolved.  Blood pressures reasonable.  Holding antihypertensives.  Hyperlipidemia Atorvastatin increased from 40 mg daily to 80 mg daily this admission.  Follow fasting lipids and LFTs in 6 weeks.  All oral meds currently on hold due to n.p.o.  If family wishes to pursue aggressive course then will need alternate modes of enteral feed i.e. core track.  Prediabetes: A1c 6.2.  Outpatient follow-up.  Chronic diastolic CHF 2D echo 3/90: LVEF 55-60% and grade 1 diastolic dysfunction.  Clinically on the euvolemic or even dehydrated side.  Monitor while gently hydrating IV.  Dehydration with ketonuria and elevated lactate Gentle IV fluids until family makes decision regarding further course.  Body mass index is 27.22 kg/m.  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  Palliative care assisting with GOC.   DVT prophylaxis: enoxaparin (LOVENOX) injection 40 mg Start: 05/30/21 2200     Code Status: Full  Code Family Communication: I discussed with patient's son Thurmond Butts early this morning at bedside in great detail.  He indicated that since patient's last stroke in November 2021, she developed right hemiplegia, has been in SNF since, moving with wheelchair using her left hand, developed memory issues.  Discussed overall guarded/poor prognosis and decrease chances of meaningful recovery for a good quality of life, options of aggressive care including trial of NG tube feeds followed by PEG tube if no recovery of dysphagia versus transitioning to comfort care. Disposition:  Status is: Inpatient  Remains inpatient appropriate because:Inpatient level of care appropriate due to severity of illness  Dispo: The patient is from: SNF              Anticipated d/c is to: SNF              Patient currently is not medically stable to d/c.   Difficult to place patient No        Consultants:   Neurology Palliative care medicine  Procedures:   None  Antimicrobials:    Anti-infectives (From admission, onward)    None         Subjective:  Patient seen along with son at bedside.  Patient remains nonverbal and noncommunicative.  Objective:   Vitals:   06/01/21 2336 06/02/21 0334 06/02/21 0750 06/02/21 1137  BP: 93/67 107/63 (!) 141/58   Pulse: 64 63 (!) 57   Resp: _0 (!) 9  Temp: 99 F (37.2 C) 99.2 F (37.3 C) 98.9 F (37.2 C)   TempSrc: Oral Oral Oral   SpO2: 97% 99% 100%   Weight:      Height:        General exam: Elderly female, moderately built and nourished lying comfortably propped up in bed without distress. Respiratory system: Clear to auscultation.  No increased work of breathing. Cardiovascular system: S1 and S2 heard, RRR.  No JVD, murmurs or pedal edema.  Telemetry personally reviewed: SB in the 50s-SR.  Brief nonsustained VT. Gastrointestinal system: Abdomen is nondistended, soft and nontender. No organomegaly or masses felt. Normal bowel sounds heard. Central  nervous system: Alert this morning.  Tracking with eyes.  Intermittent humming sound/groaning unclear.  Nonverbal.  Does not follow instructions.  Facial asymmetry present.  No real change compared to yesterday. Extremities: Not moving any limbs spontaneously or to touch even today.  Did not provide noxious stimuli.  All extremities with some edema, unclear if dependent from strokes. Skin: No rashes, lesions or ulcers Psychiatry: Judgement and insight impaired.  Mood & affect cannot be assessed    Data Reviewed:   I have personally reviewed following labs and imaging studies   CBC: Recent Labs  Lab 05/30/21 0933 05/30/21 0958  WBC  --  9.9  NEUTROABS  --  8.4*  HGB 14.3 13.3  HCT 42.0 42.9  MCV  --  91.7  PLT  --  025    Basic Metabolic Panel: Recent Labs  Lab 05/30/21 0930 05/30/21 0933 05/31/21 0343  NA 140 143 144  K 4.5 4.1 3.7  CL 108 110 114*  CO2 18*  --  21*  GLUCOSE 122* 125* 111*  BUN 20 25* 22  CREATININE 1.08* 0.80 0.77  CALCIUM 9.6  --  9.6  Liver Function Tests: Recent Labs  Lab 05/30/21 0930  AST 33  ALT 22  ALKPHOS 79  BILITOT 1.3*  PROT 7.2  ALBUMIN 3.7    CBG: Recent Labs  Lab 05/30/21 0929 05/31/21 0351  GLUCAP 86 104*    Microbiology Studies:   Recent Results (from the past 240 hour(s))  Resp Panel by RT-PCR (Flu A&B, Covid) Nasopharyngeal Swab     Status: None   Collection Time: 05/25/21  3:19 PM   Specimen: Nasopharyngeal Swab; Nasopharyngeal(NP) swabs in vial transport medium  Result Value Ref Range Status   SARS Coronavirus 2 by RT PCR NEGATIVE NEGATIVE Final    Comment: (NOTE) SARS-CoV-2 target nucleic acids are NOT DETECTED.  The SARS-CoV-2 RNA is generally detectable in upper respiratory specimens during the acute phase of infection. The lowest concentration of SARS-CoV-2 viral copies this assay can detect is 138 copies/mL. A negative result does not preclude SARS-Cov-2 infection and should not be used as the sole  basis for treatment or other patient management decisions. A negative result may occur with  improper specimen collection/handling, submission of specimen other than nasopharyngeal swab, presence of viral mutation(s) within the areas targeted by this assay, and inadequate number of viral copies(<138 copies/mL). A negative result must be combined with clinical observations, patient history, and epidemiological information. The expected result is Negative.  Fact Sheet for Patients:  EntrepreneurPulse.com.au  Fact Sheet for Healthcare Providers:  IncredibleEmployment.be  This test is no t yet approved or cleared by the [place] FDA and  has been authorized for detection and/or diagnosis of SARS-CoV-2 by FDA under an Emergency Use Authorization (EUA). This EUA will remain  in effect (meaning this test can be used) for the duration of the COVID-19 declaration under Section 564(b)(1) of the Act, 21 U.S.C.section 360bbb-3(b)(1), unless the authorization is terminated  or revoked sooner.       Influenza A by PCR NEGATIVE NEGATIVE Final   Influenza B by PCR NEGATIVE NEGATIVE Final    Comment: (NOTE) The Xpert Xpress SARS-CoV-2/FLU/RSV plus assay is intended as an aid in the diagnosis of influenza from Nasopharyngeal swab specimens and should not be used as a sole basis for treatment. Nasal washings and aspirates are unacceptable for Xpert Xpress SARS-CoV-2/FLU/RSV testing.  Fact Sheet for Patients: EntrepreneurPulse.com.au  Fact Sheet for Healthcare Providers: IncredibleEmployment.be  This test is not yet approved or cleared by the [place] FDA and has been authorized for detection and/or diagnosis of SARS-CoV-2 by FDA under an Emergency Use Authorization (EUA). This EUA will remain in effect (meaning this test can be used) for the duration of the COVID-19 declaration under Section 564(b)(1) of the Act,  21 U.S.C. section 360bbb-3(b)(1), unless the authorization is terminated or revoked.  Performed at [facility], [place] [address] [place], [place] [address]   Culture, blood (routine x 2)     Status: Abnormal   Collection Time: 05/25/21  8:57 PM   Specimen: BLOOD LEFT HAND  Result Value Ref Range Status   Specimen Description BLOOD LEFT HAND  Final   Special Requests   Final    BOTTLES DRAWN AEROBIC ONLY Blood Culture adequate volume   Culture  Setup Time   Final    AEROBIC BOTTLE ONLY GRAM POSITIVE COCCI CRITICAL RESULT CALLED TO, READ BACK BY AND VERIFIED WITH: L SEAY PHARMD 05/26/21 2303 JDW    Culture (A)  Final    STREPTOCOCCUS MITIS/ORALIS THE SIGNIFICANCE OF ISOLATING THIS ORGANISM FROM A SINGLE SET OF  BLOOD CULTURES WHEN MULTIPLE SETS ARE DRAWN IS UNCERTAIN. PLEASE NOTIFY THE MICROBIOLOGY DEPARTMENT WITHIN ONE WEEK IF SPECIATION AND SENSITIVITIES ARE REQUIRED. Performed at [facility], [place] [address] [place], [place] [address]    Report Status 05/28/2021 FINAL  Final  Culture, blood (routine x 2)     Status: None   Collection Time: 05/25/21  8:57 PM   Specimen: BLOOD  Result Value Ref Range Status   Specimen Description BLOOD SITE NOT SPECIFIED  Final   Special Requests   Final    BOTTLES DRAWN AEROBIC AND ANAEROBIC Blood Culture adequate volume   Culture   Final    NO GROWTH 5 DAYS Performed at [facility], [place] [address], [place], [place] [address]    Report Status 05/31/2021 FINAL  Final  Blood Culture ID Panel (Reflexed)     Status: Abnormal   Collection Time: 05/25/21  8:57 PM  Result Value Ref Range Status   Enterococcus faecalis NOT DETECTED NOT DETECTED Final   Enterococcus Faecium NOT DETECTED NOT DETECTED Final   Listeria monocytogenes NOT DETECTED NOT DETECTED Final   Staphylococcus species NOT DETECTED NOT DETECTED Final   Staphylococcus aureus (BCID) NOT DETECTED NOT DETECTED Final   Staphylococcus epidermidis NOT DETECTED NOT  DETECTED Final   Staphylococcus lugdunensis NOT DETECTED NOT DETECTED Final   Streptococcus species DETECTED (A) NOT DETECTED Final    Comment: Not Enterococcus species, Streptococcus agalactiae, Streptococcus pyogenes, or Streptococcus pneumoniae. CRITICAL RESULT CALLED TO, READ BACK BY AND VERIFIED WITH: L SEAY PHARMD 05/26/21 2303 JDW    Streptococcus agalactiae NOT DETECTED NOT DETECTED Final   Streptococcus pneumoniae NOT DETECTED NOT DETECTED Final   Streptococcus pyogenes NOT DETECTED NOT DETECTED Final   A.calcoaceticus-baumannii NOT DETECTED NOT DETECTED Final   Bacteroides fragilis NOT DETECTED NOT DETECTED Final   Enterobacterales NOT DETECTED NOT DETECTED Final   Enterobacter cloacae complex NOT DETECTED NOT DETECTED Final   Escherichia coli NOT DETECTED NOT DETECTED Final   Klebsiella aerogenes NOT DETECTED NOT DETECTED Final   Klebsiella oxytoca NOT DETECTED NOT DETECTED Final   Klebsiella pneumoniae NOT DETECTED NOT DETECTED Final   Proteus species NOT DETECTED NOT DETECTED Final   Salmonella species NOT DETECTED NOT DETECTED Final   Serratia marcescens NOT DETECTED NOT DETECTED Final   Haemophilus influenzae NOT DETECTED NOT DETECTED Final   Neisseria meningitidis NOT DETECTED NOT DETECTED Final   Pseudomonas aeruginosa NOT DETECTED NOT DETECTED Final   Stenotrophomonas maltophilia NOT DETECTED NOT DETECTED Final   Candida albicans NOT DETECTED NOT DETECTED Final   Candida auris NOT DETECTED NOT DETECTED Final   Candida glabrata NOT DETECTED NOT DETECTED Final   Candida krusei NOT DETECTED NOT DETECTED Final   Candida parapsilosis NOT DETECTED NOT DETECTED Final   Candida tropicalis NOT DETECTED NOT DETECTED Final   Cryptococcus neoformans/gattii NOT DETECTED NOT DETECTED Final    Comment: Performed at [facility], [address], [place], [place] [address]  Urine culture     Status: Abnormal   Collection Time: 05/30/21  9:53 AM   Specimen: Urine, Random   Result Value Ref Range Status   Specimen Description URINE, RANDOM  Final   Special Requests   Final    NONE Performed at [facility], [place] [address], [place], [place] [address]    Culture 10,000 COLONIES/mL ESCHERICHIA COLI (A)  Final   Report Status 06/01/2021 FINAL  Final   Organism ID, Bacteria ESCHERICHIA COLI (A)  Final      Susceptibility   Escherichia coli - MIC*    AMPICILLIN <=2 SENSITIVE Sensitive     CEFAZOLIN <=4 SENSITIVE Sensitive     CEFEPIME <=0.12 SENSITIVE Sensitive     CEFTRIAXONE <=0.25 SENSITIVE Sensitive     CIPROFLOXACIN <=0.25 SENSITIVE Sensitive     GENTAMICIN <=1 SENSITIVE Sensitive     IMIPENEM <=0.25 SENSITIVE Sensitive     NITROFURANTOIN <=16 SENSITIVE Sensitive     TRIMETH/SULFA <=20 SENSITIVE Sensitive     AMPICILLIN/SULBACTAM <=2 SENSITIVE Sensitive     PIP/TAZO <=4 SENSITIVE Sensitive     * 10,000 COLONIES/mL ESCHERICHIA COLI  Resp Panel by RT-PCR (Flu A&B, Covid) Urine, Catheterized     Status: None   Collection Time: 05/30/21 10:30 AM   Specimen: Urine, Catheterized; Nasopharyngeal(NP) swabs in vial transport medium  Result Value Ref Range Status   SARS Coronavirus 2 by RT PCR NEGATIVE NEGATIVE Final    Comment: (NOTE) SARS-CoV-2 target nucleic acids are NOT DETECTED.  The SARS-CoV-2 RNA is generally detectable in upper respiratory specimens during the acute phase of infection. The lowest concentration of SARS-CoV-2 viral copies this assay can detect is 138 copies/mL. A negative result does not preclude SARS-Cov-2 infection and should not be used as the sole basis for treatment or other patient management decisions. A negative result may occur with  improper specimen collection/handling, submission of specimen other than nasopharyngeal swab, presence of viral mutation(s) within the areas targeted by this assay, and inadequate number of viral copies(<138 copies/mL). A negative result must be combined with clinical observations,  patient history, and epidemiological information. The expected result is Negative.  Fact Sheet for Patients:  EntrepreneurPulse.com.au  Fact Sheet for Healthcare Providers:  IncredibleEmployment.be  This test is no t yet approved or cleared by the [place] FDA and  has been authorized for detection and/or diagnosis of SARS-CoV-2 by FDA under an Emergency Use Authorization (EUA). This EUA will remain  in effect (meaning this test can be used) for the duration of the COVID-19 declaration under Section 564(b)(1) of the Act, 21 U.S.C.section 360bbb-3(b)(1), unless the authorization is terminated  or revoked sooner.       Influenza A by PCR NEGATIVE NEGATIVE Final   Influenza B by PCR NEGATIVE NEGATIVE Final    Comment: (NOTE) The Xpert Xpress SARS-CoV-2/FLU/RSV plus assay is intended as an aid in the diagnosis of influenza from Nasopharyngeal swab specimens and should not be used as a sole basis for treatment. Nasal washings and aspirates are unacceptable for Xpert Xpress SARS-CoV-2/FLU/RSV testing.  Fact Sheet for Patients: EntrepreneurPulse.com.au  Fact Sheet for Healthcare Providers: IncredibleEmployment.be  This test is not yet approved or cleared by the [place] FDA and has been authorized for detection and/or diagnosis of SARS-CoV-2 by FDA under an Emergency Use Authorization (EUA). This EUA will remain in effect (meaning this test can be used) for the duration of the COVID-19 declaration under Section 564(b)(1) of the Act, 21 U.S.C. section 360bbb-3(b)(1), unless the authorization is terminated or revoked.  Performed at [facility], [place] [address], [place], [place] [address]   MRSA Next Gen by PCR, Nasal     Status: None   Collection Time: 05/30/21 11:39 PM   Specimen: Nasal Mucosa; Nasal Swab  Result Value Ref Range Status   MRSA by PCR Next Gen NOT DETECTED NOT DETECTED Final     Comment: (NOTE) The GeneXpert MRSA Assay (FDA approved for NASAL specimens only), is one component of a  comprehensive MRSA colonization surveillance program. It is not intended to diagnose MRSA infection nor to guide or monitor treatment for MRSA infections. Test performance is not FDA approved in patients less than 26 years old. Performed at [facility], [place] [address] [place], [place] [address]      Radiology Studies:  DG Swallowing Func-Speech Pathology  Result Date: 06/01/2021 Formatting of this result is different from the original. Objective Swallowing Evaluation: Type of Study: MBS-Modified Barium Swallow Study  Patient Details Name: Reola Buckles MRN: [id number] Date of Birth: 1951-06-07 Today's Date: 06/01/2021 Time: SLP Start Time (ACUTE ONLY): 1045 -SLP Stop Time (ACUTE ONLY): 1115 SLP Time Calculation (min) (ACUTE ONLY): 30 min Past Medical History: Past Medical History: Diagnosis Date  Cognitive communication deficit   CVA (cerebral vascular accident) ([place])   Dyslipidemia   Dysphagia following unspecified cerebrovascular disease   Dysphagia, oropharyngeal phase   Essential hypertension   Hemiplegia, unspecified affecting right dominant side (HCC)   Muscle weakness (generalized)   Other abnormalities of gait and mobility   Personal history of malignant neoplasm of breast  Past Surgical History: No past surgical history on file. HPI: 70 y/o female recently discharged on 6/25 following newfound acute infarct within R corona radiata, now presented to ED on 6/28 with worsening L sided weakness and aphasia. MRI found R corona radiata stroke size is increased from 05/25/2021 and new subcentimeter acute infarct within R external capsule. Per neuro notes from recent admission, pt was able to speak with evidence of aphasia and dysarthria; followed basic commands. 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, HTN, HLD  Subjective: alert Assessment / Plan /  Recommendation CHL IP CLINICAL IMPRESSIONS 06/01/2021 Clinical Impression Pt presents with a severe oropharyngeal dysphagia marked by severely delayed if not absent swallow response and subsequent aspiration of materials (thin liquids and purees). Pt required max cues (tactile/verbal) to generate tongue movement to propel material to back of pharynx - barium spilled from lips and there were minimal attempts to achieve a cohesive bolus that could be transferred to throat. Aspiration occurred during and after the swallow due to residue that collected then spilled from the hypopharynx into the larynx. Aspiration did not elicit a cough response.  At this time, swallow is severely impaired.  Recommend continuing NPO; Palliative care consult is pending.  Family will benefit from discussion re: alternative nutrition vs a comfort approach to care. SLP will follow. SLP Visit Diagnosis Dysphagia, oropharyngeal phase (R13.12) Attention and concentration deficit following -- Frontal lobe and executive function deficit following -- Impact on safety and function Severe aspiration risk   CHL IP TREATMENT RECOMMENDATION 06/01/2021 Treatment Recommendations Therapy as outlined in treatment plan below   Prognosis 06/01/2021 Prognosis for Safe Diet Advancement Guarded Barriers to Reach Goals -- Barriers/Prognosis Comment -- CHL IP DIET RECOMMENDATION 06/01/2021 SLP Diet Recommendations NPO Liquid Administration via -- Medication Administration -- Compensations -- Postural Changes --   CHL IP OTHER RECOMMENDATIONS 06/01/2021 Recommended Consults -- Oral Care Recommendations Oral care QID Other Recommendations --   CHL IP FOLLOW UP RECOMMENDATIONS 06/01/2021 Follow up Recommendations Skilled Nursing facility   [facility] IP FREQUENCY AND DURATION 06/01/2021 Speech Therapy Frequency (ACUTE ONLY) min 3x week Treatment Duration 2 weeks      CHL IP ORAL PHASE 06/01/2021 Oral Phase Impaired Oral - Pudding Teaspoon -- Oral - Pudding Cup -- Oral - Honey  Teaspoon -- Oral - Honey Cup -- Oral - Nectar Teaspoon -- Oral -  Nectar Cup -- Oral - Nectar Straw -- Oral - Thin Teaspoon Left anterior bolus loss;Impaired mastication;Weak lingual manipulation;Incomplete tongue to palate contact;Reduced posterior propulsion;Holding of bolus;Left pocketing in lateral sulci;Pocketing in anterior sulcus;Lingual/palatal residue;Decreased bolus cohesion;Delayed oral transit Oral - Thin Cup -- Oral - Thin Straw -- Oral - Puree Left anterior bolus loss;Weak lingual manipulation;Reduced posterior propulsion;Holding of bolus;Left pocketing in lateral sulci;Pocketing in anterior sulcus;Lingual/palatal residue;Delayed oral transit;Decreased bolus cohesion Oral - Mech Soft -- Oral - Regular -- Oral - Multi-Consistency -- Oral - Pill -- Oral Phase - Comment --  CHL IP PHARYNGEAL PHASE 06/01/2021 Pharyngeal Phase Impaired Pharyngeal- Pudding Teaspoon -- Pharyngeal -- Pharyngeal- Pudding Cup -- Pharyngeal -- Pharyngeal- Honey Teaspoon -- Pharyngeal -- Pharyngeal- Honey Cup -- Pharyngeal -- Pharyngeal- Nectar Teaspoon -- Pharyngeal -- Pharyngeal- Nectar Cup -- Pharyngeal -- Pharyngeal- Nectar Straw -- Pharyngeal -- Pharyngeal- Thin Teaspoon Delayed swallow initiation-pyriform sinuses;Penetration/Aspiration before swallow;Penetration/Aspiration during swallow;Penetration/Apiration after swallow;Trace aspiration;Pharyngeal residue - pyriform;Pharyngeal residue - valleculae Pharyngeal Material enters airway, passes BELOW cords without attempt by patient to eject out (silent aspiration) Pharyngeal- Thin Cup -- Pharyngeal -- Pharyngeal- Thin Straw -- Pharyngeal -- Pharyngeal- Puree Reduced pharyngeal peristalsis;Penetration/Apiration after swallow;Trace aspiration;Pharyngeal residue - valleculae;Pharyngeal residue - pyriform Pharyngeal Material enters airway, passes BELOW cords without attempt by patient to eject out (silent aspiration) Pharyngeal- Mechanical Soft -- Pharyngeal -- Pharyngeal- Regular  -- Pharyngeal -- Pharyngeal- Multi-consistency -- Pharyngeal -- Pharyngeal- Pill -- Pharyngeal -- Pharyngeal Comment --  No flowsheet data found. Juan Quam Laurice 06/01/2021, 2:32 PM                Scheduled Meds:     stroke: mapping our early stages of recovery book   Does not apply Once   aspirin  300 mg Rectal Daily   atorvastatin  40 mg Oral QHS   chlorhexidine  15 mL Mouth Rinse BID   clopidogrel  75 mg Oral Daily   enoxaparin (LOVENOX) injection  40 mg Subcutaneous Q24H   mouth rinse  15 mL Mouth Rinse q12n4p   mirtazapine  7.5 mg Oral QHS    Continuous Infusions:    lactated ringers 50 mL/hr at 06/01/21 2344     LOS: 3 days     Vernell Leep, MD, [place], [facility]. [facility]    To contact the attending provider between 7A-7P or the covering provider during after hours 7P-7A, please log into the web site [email or web address] and access using universal [place] password for that web site. If you do not have the password, please call the hospital operator.  06/02/2021, 5:25 PM

## 2021-06-02 NOTE — Progress Notes (Signed)
  Speech Language Pathology Treatment: Dysphagia;Cognitive-Linquistic  Patient Details Name: Brandi Stewart MRN: [id number] DOB: 1951/11/24 Today's Date: 06/02/2021 Time: 9179-1505 SLP Time Calculation (min) (ACUTE ONLY): 15 min  Assessment / Plan / Recommendation Clinical Impression  Pt alert, making eye contact with examiner.  Repositioned to optimize participation. Reviewed YES/NO signs and asked pt basic/biographical yes/no questions to determine her ability to use signs functionally (e.g, "Is your last name Rosen? Is your last name Tamala Julian?) Over six questions pt was unable to look with intent and hold gaze at signs with any accuracy. There were no attempts to nod head/shake head yes/no in response to questions with max assist provided.   Pt was given several ice chips. She actively masticated them and initiated a swallow after significant delay. Each swallow was followed by significant coughing, consistent with performance on yesterday's MBS. Pt remains at severe risk for aspiration and malnutrition due to severe dysphagia.   Pt's sons met with Palliative Care and Neuro to discuss [place] today. SLP will follow for plan.   HPI HPI: 70 y/o female recently discharged on 6/25 following newfound acute infarct within R corona radiata, now presented to ED on 6/28 with worsening L sided weakness and aphasia. MRI found R corona radiata stroke size is increased from 05/25/2021 and new subcentimeter acute infarct within R external capsule. Per neuro notes from recent admission, pt was able to speak with evidence of aphasia and dysarthria; followed basic commands. 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

## 2021-06-02 NOTE — Progress Notes (Addendum)
Daily Progress Note   Patient Name: Brandi Stewart       Date: 06/02/2021 DOB: 10/15/1951  Age: 70 y.o. MRN#: [id number] Attending Physician: Modena Jansky, MD Primary Care Physician: Pcp, No Admit Date: 05/30/2021 Length of Stay: 3 days  Reason for Consultation/Follow-up: Establishing goals of care  HPI/Patient Profile:  70 y.o. 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 Code stroke was called, not a tPA candidate (outside window).   Last "normal" was the night prior to presentation. She was able to communicate with family last night, but this AM was acutely non verbal and EMS was called. She has been on ASA and clopidogrel for stroke prophylaxis. Son also state she was able to feed herself last night, but her fine motor skills were not as good as they have been.    She was just admitted to [facility] on 6/23 for acute worsening of right sided weakness and aphasia and found to have an acute subcortical infarct. Neurology followed along and felt this was unrelated to presenting complaints. She has been seen at [facility] for multiple previous strokes as well. In November 2021 she had an acute left thalamoscapuslar junction and left basal ganglia infarct and imaging showed a chronic right MCA territory infarct. EEGs were also obtained without evidence of seizures, but she was placed on Keppra for seizure prophylaxis. She continues to be on this.  Current Medications: Scheduled Meds:  .  stroke: mapping our early stages of recovery book   Does not apply Once  . aspirin  300 mg Rectal Daily  . atorvastatin  40 mg Oral QHS  . chlorhexidine  15 mL Mouth Rinse BID  . clopidogrel  75 mg Oral Daily  . enoxaparin (LOVENOX) injection  40 mg Subcutaneous Q24H  . mouth rinse  15 mL Mouth Rinse q12n4p   . mirtazapine  7.5 mg Oral QHS    Continuous Infusions: . lactated ringers 50 mL/hr at 06/01/21 2344    PRN Meds: acetaminophen **OR** acetaminophen (TYLENOL) oral liquid 160 mg/5 mL **OR** acetaminophen, albuterol, melatonin  Subjective:   Subjective: Chart Reviewed. Updates received. Patient Assessed. Created space and opportunity for patient  and family to explore thoughts and feelings regarding current medical situation.  Today's Discussion: I met in the patient's room with her sons Thurmond Butts and [place], along with Ruta Hinds, NP (neurology) and Dr. Erlinda Hong (neurology). Courtney and Dr. Erlinda Hong provided clinical and neurologic explanation to the patient's sons as well as some potential outlooks and risk for recurrent strokes.   We had some discussions related to goals with focus on what the patient's sons believe her wishes would be.  They appear to emphasize that she would not want a feeding tube.  Unfortunately, she failed her MBSS test yesterday.  We discussed how if she is unable to eat or drink and they do not want tube feeds then we would be more limited in her prognostic/time remaining since.  We brought up CODE STATUS and the unlikelihood that resuscitation would result in survival or improvement in condition and could actually cause more problems.  They requested time to think more about this.  A copy of a blank MOST form was provided to help them begin to think about other decisions that would need to be made.  We  mentioned possible discharge dispositions including home hospice, residential hospice as possibilities depending on care decisions that are made.  They expressed that the patient would appreciate a visit from spiritual care for prayer and presents.  Review of Systems  Unable to perform ROS: Patient nonverbal   Objective:   Vital Signs: BP (!) 141/58 (BP Location: Left Arm)   Pulse (!) 57   Temp 98.9 F (37.2 C) (Oral)   Resp (!) 9   Ht 5' 6"  (1.676 m)   Wt 76.5 kg    SpO2 100%   BMI 27.22 kg/m  SpO2: SpO2: 100 % O2 Device: O2 Device: Room Air O2 Flow Rate: O2 Flow Rate (L/min): 0 L/min  Physical Exam: Physical Exam Vitals and nursing note reviewed.  Constitutional:      General: She is not in acute distress.    Appearance: She is not toxic-appearing.  HENT:     Head: Normocephalic and atraumatic.  Pulmonary:     Effort: Pulmonary effort is normal. No respiratory distress.     Comments: Noted wet cough multiple times during the visit Neurological:     Mental Status: She is alert.     Comments: Tracking, not following commands significantly, non-verbal, no movement of extremities.    SpO2: SpO2: 100 % O2 Device:SpO2: 100 % O2 Flow Rate: .O2 Flow Rate (L/min): 0 L/min  IO: Intake/output summary:  Intake/Output Summary (Last 24 hours) at 06/02/2021 1529 Last data filed at 06/01/2021 2344 Gross per 24 hour  Intake 209.99 ml  Output 500 ml  Net -290.01 ml    LBM: Last BM Date: 05/31/21 Baseline Weight: Weight: 77.1 kg Most recent weight: Weight: 76.5 kg   Palliative Assessment/Data: 10%   Assessment & Plan:   Impression: Continued deficits status post CVA (4 strokes since approximately November 2021).  We have made some good headway in bringing up possible decisions that need to be made.  Family is requesting more time, which is appropriate given the overwhelming nature of her acute onset changes and processing time needed.  The patient has not really made any progress, although it is only been 1 day.  She did fail the modified barium swallow study and remains NPO.  Indicated she would not want tube feeds and they are honoring this wish.  Palliative medicine can continue to follow and engage in discussions for decision making with education, family support, time.  SUMMARY OF RECOMMENDATIONS   Take 1 day at a time Follow-up tomorrow for continued discussions on CODE STATUS and goals of care Spiritual care consult for presence, prayer,  family support Further recommendations will be made after family has time to process information.  Code Status: Full code  Goals of Care/Recommendations: Remain full code for now No tube feedings Further goals of care pending future discussions, tomorrow and moving forward  Symptom Management: No obvious palliative symptom management needs identified today  Prognosis: < 4 weeks  Discharge Planning: To Be Determined  Discussed with: Dr. Erlinda Hong; Ruta Hinds, NP; Dr. Algis Liming  Thank you for allowing [place] to participate in the care of Tye Juarez PMT will continue to support holistically.  Time Total: 75 mins  Visit consisted of counseling and education dealing with the complex and emotionally intense issues of symptom management and palliative care in the setting of serious and potentially life-threatening illness. Greater than 50%  of this time was spent counseling and coordinating care related to the above assessment and plan.  Walden Field, NP Palliative Medicine Team  Team Phone # [phone] (Nights/Weekends)  08/05/1120, 6:24 PM   Vinie Sill, NP Palliative Medicine Team Pager [phone] (Please see [email or web address] for schedule) Team Phone [phone]    Greater than 50%  of this time was spent counseling and coordinating care related to the above assessment and plan

## 2021-06-02 NOTE — Progress Notes (Addendum)
STROKE TEAM PROGRESS NOTE   Interval History  No acute events overnight. Patient is resting in bed. Sons Brandi Stewart and Brandi Stewart are at bedside to have a discussion with neurology and the palliative care team.   Spoke with sons at length regarding the patients trajectory. They were made aware that she is severely debilitated from her multiple strokes and it is not likely she will have significant neurological recovery that will provide her with good quality of life. At this time she has failed her swallow evaluation- MBS on 06/01/21 and ST has recommended NPO. It was explained to her sons that the next step, to continue to provide enteral nutrition would be to proceed with a peg tube.   Asked the sons if this would align with the patients wishes if she had the capacity to make decisions for herself- they state no.   They were informed about comfort care measures. Code status was also discussed- they were educated that undergoing CPR/intubation would likely worsen her condition given she is already debilitated. They were educated about the DNAR code status and how with a code status of DNAR, chest compressions and intubation would not be done. They stated that she would not want intubation but want to think more about if she would want chest compressions.   Given they are unsure, discussed that the team would maintain code status as full code at this time.   Will give them time to think about code status as well as transitioning to comfort care- palliative care will be touching base with them over the next few days   Pertinent Lab Work and Imaging    05/30/21 CT Head WO IV Contrast There is a degree of motion artifact making this study less than optimal. Stable atrophy with prior infarcts at several sites, primarily on the right. There is periventricular small vessel disease. No acute infarct is demonstrated on this study. No mass or hemorrhage.  05/25/21 MR Angio Head and Neck WO Contrast  Intracranial  atherosclerotic disease with multifocal stenoses, most notably as follows.   Progressive severe stenosis within the distal M1 right middle cerebral artery.   Redemonstrated multifocal severe stenoses within the A2 and A3 right anterior cerebral artery. Flow related enhancement is poorly appreciated within the right anterior cerebral artery distal to this. On the prior CTA of 10/10/2020, this portion of the right anterior cerebral artery demonstrated only minimal thready enhancement.   New severe stenosis versus artifact within the A3/A4 left anterior cerebral artery.   The non dominant intracranial left vertebral artery is irregular and poorly delineated beyond the left PICA origin. This is at least partially related to developmentally small vessel size. There may be superimposed high-grade stenosis.   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  05/30/21 MRI Brain WO IV Contrast Acute/early subacute infarction changes within the right corona radiata have increased in size from the brain MRI of 05/25/2021, now measuring 2.3 x 1.0 x 2.2 cm. Additionally, there is a new subcentimeter acute infarct within the right external capsule.   Chronic cortical/subcortical infarcts within the right cerebral hemisphere, multiple chronic lacunar infarcts, background cerebral white matter chronic small vessel ischemic disease and generalized parenchymal atrophy, as detailed.  05/26/21 Echocardiogram Complete   1. Left ventricular ejection fraction, by estimation, is 55 to 60%. The left ventricle has normal function. The left ventricle has no regional wall motion abnormalities. There is mild left ventricular hypertrophy.  Left ventricular diastolic parameters are consistent with  Grade I diastolic dysfunction (impaired relaxation). Elevated left atrial pressure.   2. Right ventricular systolic function is normal. The right ventricular size is normal.   3. The mitral valve is  normal in structure. Trivial mitral valve regurgitation. No evidence of mitral stenosis.   4. The aortic valve is calcified. Aortic valve regurgitation is not visualized. No aortic stenosis is present.   Physical Examination   Constitutional: Calm, appropriate for condition  Cardiovascular: Normal RR Respiratory: No increased WOB   Mental status: Non verbal, follows simple commands such as close/open eyes and wiggles toes the left foot when asked  Speech: Global aphasia  Cranial nerves: EOMI, Decreased BTT on the left, right facial droop, does not stick out tongue when asked to assess CN12 Motor: Normal bulk and tone. She is unable to participate in strength testing. RUE 0/5 with increased tone noted. RLE also with increased tone, 0/5 LUE 2/5 LLE 2/5  Sensory: UTA, did not use noxious stimulation  Coordination: UTA due to limited ability to follow commands Gait: Deferred due to RLE and LLE weakness   Assessment and Plan   Brandi Stewart is a 70 y.o. 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 She was outside the time window for IVTPA and was not a candidate for mechanical thrombectomy given poor baseline.   #R [employer] Stroke  She presents with the symptoms described above. Of note, patient is familiar to the stroke service- she was last seen at [facility] on 6/23 for worsening right sided weakness and worsening of her aphasia- MRI at the time showed a new right corona radiata stroke that was felt to not correlate with her presenting sx then. She had a full stroke work up at that time. Vessel imaging w/MRA Head and Neck at the time was pertinent for ICAD( right M1, right and left ACAs). Echo w/EF 55 to 60 % and LA normal in size.   MRI Brain this admission shows an increase in size of the prior right corona radiata stroke. Stroke labs w/LDL 134,  A1C 6.2. Her stroke etiology is cryptogenic but suspect possibly due to ICAD given notable stenosis on vessel imaging last admission. She had an MRA at that time, CTA Head and Neck was done to further evaluate stenosis; this showed new moderate stenosis at the left M3 MA segments, severe stenosis at the left A3 ACA and new occlusion of the distal left vertebral artery when compared to prior CTA Head and Neck done in November of 2021.   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 Considered looping her however given poor prognosis ( she is debilitated due to her multiple strokes) ultimately decided not to consult EP for loop.  - Continue DAPT for secondary stroke prevention ( started 05/25/21, end 06/15/21) followed by Plavix monotherapy  - Increased Atorvastatin from 40 to 80 daily for better LDL control/stroke prevention purposes this admission  - Palliative care has been consulted given her poor prognosis. If she transitions to comfort care would stop all stroke prevention medications.   #Hypertension She has a history of HTN and takes Telmisartan 40 mg QD at home. Currently blood pressure is trending in the 110-130 range. Recommend permissive hypertension 48 hours post stroke and from there, gradually reduce the blood pressure, avoiding any acute drops. Long term blood pressure goal is < 140/90.  #Hyperlipidemia From  a stroke prevention stand point, the LDL goal is < 70. Her LDL is 134. Home Atorvastatin 40 mg increased to 80 mg this admission.   #Stroke Diabetes Screening  #Prediabetes  Hemoglobin A1C this admission noted to be 6.2, at goal from a stroke reduction stand point given its < 7.   Hospital day # 3  Stark Jock, NP  [facility] Nurse Practitioner Patient seen and discussed with attending physician Dr. Roda Shutters   Neurology will sign off at this time- thank you for consulting and please reach out via Amion with any questions   ATTENDING NOTE: I  reviewed above note and agree with the assessment and plan. Pt was seen and examined.   Both sons are at the bedside.  Palliative care also at bedside.  Patient lying in bed, eyes open, tracking bilaterally, however nonverbal, only follow limited simple commands.  Still has significant muscle weakness except left upper extremity biceps 3/5.  Patient still not passing swallow.  Had extensive discussion with 2 sons, they expressed patient wishes of no tube feeding.  We will hold off core track or PEG tube at this time.  We also discussed about further GOC, per sons, pt wishes no artifical life support if condition is terminal. They requested time to discussion code status and further GOC among themselves. They will get back to primary team and the palliative care services tomorrow.  Continue aspirin 300 PR at this time.  Pending further GOC discussion with palliative care and primary team.  Discussed with Dr. Waymon Amato.  For detailed assessment and plan, please refer to above as I have made changes wherever appropriate.   Neurology will sign off. Please call with questions. Pt will follow up with stroke clinic NP at [facility] as needed. Thanks for the consult.   Marvel Plan, MD PhD Stroke Neurology 06/02/2021 9:59 AM    To contact Stroke Continuity provider, please refer to [email or web address]. After hours, contact General Neurology

## 2021-06-03 MED ORDER — BENZOCAINE 10 % MT GEL
Freq: Three times a day (TID) | OROMUCOSAL | Status: DC
  Filled 2021-06-03: qty 9

## 2021-06-03 MED ORDER — MORPHINE SULFATE (PF) 2 MG/ML IV SOLN
1.0000 mg | INTRAVENOUS | Status: DC | PRN
  Filled 2021-06-03: qty 1

## 2021-06-03 MED ORDER — HALOPERIDOL 0.5 MG PO TABS: 0.5000 mg | ORAL_TABLET | ORAL | Status: DC | PRN

## 2021-06-03 MED ORDER — ONDANSETRON 4 MG PO TBDP: 4.0000 mg | ORAL_TABLET | Freq: Four times a day (QID) | ORAL | Status: DC | PRN

## 2021-06-03 MED ORDER — POLYVINYL ALCOHOL 1.4 % OP SOLN
1.0000 [drp] | Freq: Four times a day (QID) | OPHTHALMIC | Status: DC | PRN
  Filled 2021-06-03: qty 15

## 2021-06-03 MED ORDER — ORAL CARE MOUTH RINSE
15.0000 mL | Freq: Two times a day (BID) | OROMUCOSAL | Status: DC
  Administered 2021-06-03 – 2021-06-06 (×7): 15 mL via OROMUCOSAL

## 2021-06-03 MED ORDER — GLYCOPYRROLATE 0.2 MG/ML IJ SOLN: 0.2000 mg | INTRAMUSCULAR | Status: DC | PRN

## 2021-06-03 MED ORDER — GLYCOPYRROLATE 0.2 MG/ML IJ SOLN
0.2000 mg | INTRAMUSCULAR | Status: DC | PRN
  Filled 2021-06-03: qty 1

## 2021-06-03 MED ORDER — HALOPERIDOL LACTATE 2 MG/ML PO CONC
0.5000 mg | ORAL | Status: DC | PRN
  Filled 2021-06-03: qty 0.3

## 2021-06-03 MED ORDER — ONDANSETRON HCL 4 MG/2ML IJ SOLN: 4.0000 mg | Freq: Four times a day (QID) | INTRAMUSCULAR | Status: DC | PRN

## 2021-06-03 MED ORDER — BIOTENE DRY MOUTH MT LIQD: 15.0000 mL | OROMUCOSAL | Status: DC | PRN

## 2021-06-03 MED ORDER — LORAZEPAM 2 MG/ML IJ SOLN
0.5000 mg | INTRAMUSCULAR | Status: DC | PRN
  Administered 2021-06-03 (×2): 0.5 mg via INTRAVENOUS

## 2021-06-03 MED ORDER — GLYCOPYRROLATE 1 MG PO TABS
1.0000 mg | ORAL_TABLET | ORAL | Status: DC | PRN
  Filled 2021-06-03: qty 1

## 2021-06-03 MED ORDER — HALOPERIDOL LACTATE 5 MG/ML IJ SOLN: 0.5000 mg | INTRAMUSCULAR | Status: DC | PRN

## 2021-06-03 MED ORDER — CHLORHEXIDINE GLUCONATE 0.12 % MT SOLN
15.0000 mL | Freq: Two times a day (BID) | OROMUCOSAL | Status: DC
  Administered 2021-06-03 – 2021-06-06 (×5): 15 mL via OROMUCOSAL
  Filled 2021-06-03 (×4): qty 15

## 2021-06-03 MED ORDER — LORAZEPAM 2 MG/ML IJ SOLN
0.2500 mg | Freq: Two times a day (BID) | INTRAMUSCULAR | Status: DC
  Administered 2021-06-03 (×2): 0.25 mg via INTRAVENOUS
  Filled 2021-06-03 (×2): qty 1

## 2021-06-03 NOTE — Progress Notes (Signed)
PROGRESS NOTE   Brandi LevyBrenton Stewart  WRU:[id number]    DOB: 08/12/1951    DOA: 05/30/2021  PCP: Pcp, No   I have briefly reviewed patients previous medical [facility].  Chief Complaint  Patient presents with   Code Stroke    Brief Narrative:   70 y.o. 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  She was recently hospitalized 05/25/2021 - 05/27/2021 when MRI brain revealed subcortical infarct, neurology recommended continuing dual antiplatelets x3 weeks then Plavix alone and continuing her Keppra.  Code stroke was called in the ED.  Patient admitted for recurrent acute stroke.  Failed swallow evaluation and MBS, remains n.p.o.  Neurology/stroke team consulted.  Palliative care consulted and family opted to transition patient to full comfort care on 7/2 with eventual discharge to residential hospice.   Assessment & Plan:  Principal Problem:   Acute left hemiparesis (HCC) Active Problems:   History of CVA with residual deficit   Prediabetes   Acute left-sided weakness   Acute CVA (cerebrovascular accident) (HCC)   Acute stroke with left hemiparesis and aphasia: History of multiple prior CVAs.  She now presented with left-sided weakness and nonverbal state.  Not a candidate for tPA or mechanical thrombectomy.  Neurology/stroke service consulted and their input appreciated.  She recently had a full stroke work-up.  MRI this admission shows increase in size of prior right corona radiator stroke.  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   Loop recorder considered however given poor prognosis, neurology decided not to consult EP.  SLP input appreciated, failed swallow evaluation, and MBS, continue n.p.o. and IV fluids in the interim.  EEG shows encephalopathy features without seizures.  Given overall poor prognosis, Palliative care was consulted and family opted to transition patient to full comfort care on 7/2 with eventual discharge to residential hospice.  All medications nonessential to comfort were discontinued.  Acute encephalopathy: Acute on chronic encephalopathy related to multiple strokes.  Essential hypertension/hypotension Antihypertensives discontinued.  Hyperlipidemia Meds nonessential to comfort discontinued.  Prediabetes: A1c 6.2.    Chronic diastolic CHF 2D echo 6/24: LVEF 55-60% and grade 1 diastolic dysfunction.    Dehydration with ketonuria and elevated lactate IV fluids stopped  Body mass index is 27.22 kg/m.  Adult failure to thrive Secondary to recurrent strokes, associated debility and multiple medical problems.  Palliative care consulted and family opted to transition patient to full comfort care on 7/2 with eventual discharge to residential hospice.   DVT prophylaxis:      Code Status: DNR Family Communication: None at bedside. Disposition:  Status is: Inpatient  Remains inpatient appropriate because:Inpatient level of care appropriate due to severity of illness  Dispo: The patient is from: SNF              Anticipated d/c is to: SNF              Patient currently is not medically stable to d/c.   Difficult to place patient No        Consultants:   Neurology Palliative  care medicine  Procedures:   None  Antimicrobials:    Anti-infectives (From admission, onward)    None         Subjective:  Some interaction today.  Patient blinking her eyes when asked to.  Otherwise no significant change.  Remains nonverbal and unable to follow any other  instructions/quadriplegic.  Objective:   Vitals:   06/02/21 2312 06/03/21 0416 06/03/21 0847 06/03/21 1321  BP: 121/62 (!) 149/95 104/78 103/77  Pulse: 69 78 69 61  Resp: 18 19 17 12   Temp: 100 F (37.8 C) 98.2 F (36.8 C) 98.6 F (37 C) 98.4 F (36.9 C)  TempSrc: Oral Oral Oral Oral  SpO2: 99% 100% 100% 99%  Weight:      Height:        General exam: Elderly female, moderately built and nourished lying comfortably propped up in bed without distress. Respiratory system: Clear to auscultation.  No increased work of breathing. Cardiovascular system: S1 and S2 heard, RRR.  No JVD, murmurs or pedal edema.  Telemetry personally reviewed: SB in the 50s-SR Gastrointestinal system: Abdomen is nondistended, soft and nontender. No organomegaly or masses felt. Normal bowel sounds heard. Central nervous system: Alert this morning.  Tracking with eyes.  Did follow instructions when she blinked her eyes when requested.  Nonverbal.  Facial asymmetry present.   Extremities: Not moving any limbs spontaneously or to touch even today.  Did not provide noxious stimuli.  All extremities with some edema, unclear if dependent from strokes. Skin: No rashes, lesions or ulcers Psychiatry: Judgement and insight impaired.  Mood & affect cannot be assessed    Data Reviewed:   I have personally reviewed following labs and imaging studies   CBC: Recent Labs  Lab 05/30/21 0933 05/30/21 0958  WBC  --  9.9  NEUTROABS  --  8.4*  HGB 14.3 13.3  HCT 42.0 42.9  MCV  --  91.7  PLT  --  363    Basic Metabolic Panel: Recent Labs  Lab 05/30/21 0930 05/30/21 0933 05/31/21 0343  NA 140 143 144  K 4.5 4.1 3.7  CL 108 110 114*  CO2 18*  --  21*  GLUCOSE 122* 125* 111*  BUN 20 25* 22  CREATININE 1.08* 0.80 0.77  CALCIUM 9.6  --  9.6    Liver Function Tests: Recent Labs  Lab 05/30/21 0930  AST 33  ALT 22  ALKPHOS 79  BILITOT 1.3*  PROT 7.2  ALBUMIN 3.7    CBG: Recent Labs  Lab  05/30/21 0929 05/31/21 0351  GLUCAP 86 104*    Microbiology Studies:   Recent Results (from the past 240 hour(s))  Resp Panel by RT-PCR (Flu A&B, Covid) Nasopharyngeal Swab     Status: None   Collection Time: 05/25/21  3:19 PM   Specimen: Nasopharyngeal Swab; Nasopharyngeal(NP) swabs in vial transport medium  Result Value Ref Range Status   SARS Coronavirus 2 by RT PCR NEGATIVE NEGATIVE Final    Comment: (NOTE) SARS-CoV-2 target nucleic acids are NOT DETECTED.  The SARS-CoV-2 RNA is generally detectable in upper respiratory specimens during the acute phase of infection. The lowest concentration of SARS-CoV-2 viral copies this assay can detect is 138 copies/mL. A negative result does not preclude SARS-Cov-2 infection and should not be used as the sole basis for treatment or other patient management decisions. A negative result may occur with  improper specimen collection/handling, submission of specimen other than nasopharyngeal swab, presence of viral mutation(s) within the areas  targeted by this assay, and inadequate number of viral copies(<138 copies/mL). A negative result must be combined with clinical observations, patient history, and epidemiological information. The expected result is Negative.  Fact Sheet for Patients:  BloggerCourse.com  Fact Sheet for Healthcare Providers:  SeriousBroker.it  This test is no t yet approved or cleared by the [place] FDA and  has been authorized for detection and/or diagnosis of SARS-CoV-2 by FDA under an Emergency Use Authorization (EUA). This EUA will remain  in effect (meaning this test can be used) for the duration of the COVID-19 declaration under Section 564(b)(1) of the Act, 21 U.S.C.section 360bbb-3(b)(1), unless the authorization is terminated  or revoked sooner.       Influenza A by PCR NEGATIVE NEGATIVE Final   Influenza B by PCR NEGATIVE NEGATIVE Final    Comment:  (NOTE) The Xpert Xpress SARS-CoV-2/FLU/RSV plus assay is intended as an aid in the diagnosis of influenza from Nasopharyngeal swab specimens and should not be used as a sole basis for treatment. Nasal washings and aspirates are unacceptable for Xpert Xpress SARS-CoV-2/FLU/RSV testing.  Fact Sheet for Patients: BloggerCourse.com  Fact Sheet for Healthcare Providers: SeriousBroker.it  This test is not yet approved or cleared by the [place] FDA and has been authorized for detection and/or diagnosis of SARS-CoV-2 by FDA under an Emergency Use Authorization (EUA). This EUA will remain in effect (meaning this test can be used) for the duration of the COVID-19 declaration under Section 564(b)(1) of the Act, 21 U.S.C. section 360bbb-3(b)(1), unless the authorization is terminated or revoked.  Performed at [facility], [address] [place], [place] [address]   Culture, blood (routine x 2)     Status: Abnormal   Collection Time: 05/25/21  8:57 PM   Specimen: BLOOD LEFT HAND  Result Value Ref Range Status   Specimen Description BLOOD LEFT HAND  Final   Special Requests   Final    BOTTLES DRAWN AEROBIC ONLY Blood Culture adequate volume   Culture  Setup Time   Final    AEROBIC BOTTLE ONLY GRAM POSITIVE COCCI CRITICAL RESULT CALLED TO, READ BACK BY AND VERIFIED WITH: L SEAY PHARMD 05/26/21 2303 JDW    Culture (A)  Final    STREPTOCOCCUS MITIS/ORALIS THE SIGNIFICANCE OF ISOLATING THIS ORGANISM FROM A SINGLE SET OF BLOOD CULTURES WHEN MULTIPLE SETS ARE DRAWN IS UNCERTAIN. PLEASE NOTIFY THE MICROBIOLOGY DEPARTMENT WITHIN ONE WEEK IF SPECIATION AND SENSITIVITIES ARE REQUIRED. Performed at [facility], [address] [place], [place] [address]    Report Status 05/28/2021 FINAL  Final  Culture, blood (routine x 2)     Status: None   Collection Time: 05/25/21  8:57 PM   Specimen: BLOOD  Result Value Ref Range Status    Specimen Description BLOOD SITE NOT SPECIFIED  Final   Special Requests   Final    BOTTLES DRAWN AEROBIC AND ANAEROBIC Blood Culture adequate volume   Culture   Final    NO GROWTH 5 DAYS Performed at [facility], [address] [place], [place] [address]    Report Status 05/31/2021 FINAL  Final  Blood Culture ID Panel (Reflexed)     Status: Abnormal   Collection Time: 05/25/21  8:57 PM  Result Value Ref Range Status   Enterococcus faecalis NOT DETECTED NOT DETECTED Final   Enterococcus Faecium NOT DETECTED NOT DETECTED Final   Listeria monocytogenes NOT DETECTED NOT DETECTED Final   Staphylococcus species NOT DETECTED NOT DETECTED  Final   Staphylococcus aureus (BCID) NOT DETECTED NOT DETECTED Final   Staphylococcus epidermidis NOT DETECTED NOT DETECTED Final   Staphylococcus lugdunensis NOT DETECTED NOT DETECTED Final   Streptococcus species DETECTED (A) NOT DETECTED Final    Comment: Not Enterococcus species, Streptococcus agalactiae, Streptococcus pyogenes, or Streptococcus pneumoniae. CRITICAL RESULT CALLED TO, READ BACK BY AND VERIFIED WITH: L SEAY PHARMD 05/26/21 2303 JDW    Streptococcus agalactiae NOT DETECTED NOT DETECTED Final   Streptococcus pneumoniae NOT DETECTED NOT DETECTED Final   Streptococcus pyogenes NOT DETECTED NOT DETECTED Final   A.calcoaceticus-baumannii NOT DETECTED NOT DETECTED Final   Bacteroides fragilis NOT DETECTED NOT DETECTED Final   Enterobacterales NOT DETECTED NOT DETECTED Final   Enterobacter cloacae complex NOT DETECTED NOT DETECTED Final   Escherichia coli NOT DETECTED NOT DETECTED Final   Klebsiella aerogenes NOT DETECTED NOT DETECTED Final   Klebsiella oxytoca NOT DETECTED NOT DETECTED Final   Klebsiella pneumoniae NOT DETECTED NOT DETECTED Final   Proteus species NOT DETECTED NOT DETECTED Final   Salmonella species NOT DETECTED NOT DETECTED Final   Serratia marcescens NOT DETECTED NOT DETECTED Final   Haemophilus influenzae NOT  DETECTED NOT DETECTED Final   Neisseria meningitidis NOT DETECTED NOT DETECTED Final   Pseudomonas aeruginosa NOT DETECTED NOT DETECTED Final   Stenotrophomonas maltophilia NOT DETECTED NOT DETECTED Final   Candida albicans NOT DETECTED NOT DETECTED Final   Candida auris NOT DETECTED NOT DETECTED Final   Candida glabrata NOT DETECTED NOT DETECTED Final   Candida krusei NOT DETECTED NOT DETECTED Final   Candida parapsilosis NOT DETECTED NOT DETECTED Final   Candida tropicalis NOT DETECTED NOT DETECTED Final   Cryptococcus neoformans/gattii NOT DETECTED NOT DETECTED Final    Comment: Performed at [facility], [address] [place], [place] [address]  Urine culture     Status: Abnormal   Collection Time: 05/30/21  9:53 AM   Specimen: Urine, Random  Result Value Ref Range Status   Specimen Description URINE, RANDOM  Final   Special Requests   Final    NONE Performed at [facility], [address], [place], [place] [address]    Culture 10,000 COLONIES/mL ESCHERICHIA COLI (A)  Final   Report Status 06/01/2021 FINAL  Final   Organism ID, Bacteria ESCHERICHIA COLI (A)  Final      Susceptibility   Escherichia coli - MIC*    AMPICILLIN <=2 SENSITIVE Sensitive     CEFAZOLIN <=4 SENSITIVE Sensitive     CEFEPIME <=0.12 SENSITIVE Sensitive     CEFTRIAXONE <=0.25 SENSITIVE Sensitive     CIPROFLOXACIN <=0.25 SENSITIVE Sensitive     GENTAMICIN <=1 SENSITIVE Sensitive     IMIPENEM <=0.25 SENSITIVE Sensitive     NITROFURANTOIN <=16 SENSITIVE Sensitive     TRIMETH/SULFA <=20 SENSITIVE Sensitive     AMPICILLIN/SULBACTAM <=2 SENSITIVE Sensitive     PIP/TAZO <=4 SENSITIVE Sensitive     * 10,000 COLONIES/mL ESCHERICHIA COLI  Resp Panel by RT-PCR (Flu A&B, Covid) Urine, Catheterized     Status: None   Collection Time: 05/30/21 10:30 AM   Specimen: Urine, Catheterized; Nasopharyngeal(NP) swabs in vial transport medium  Result Value Ref Range Status   SARS Coronavirus 2 by RT PCR  NEGATIVE NEGATIVE Final    Comment: (NOTE) SARS-CoV-2 target nucleic acids are NOT DETECTED.  The SARS-CoV-2 RNA is generally detectable in upper respiratory specimens during the acute phase of infection. The lowest concentration of SARS-CoV-2 viral copies this assay can detect  is 138 copies/mL. A negative result does not preclude SARS-Cov-2 infection and should not be used as the sole basis for treatment or other patient management decisions. A negative result may occur with  improper specimen collection/handling, submission of specimen other than nasopharyngeal swab, presence of viral mutation(s) within the areas targeted by this assay, and inadequate number of viral copies(<138 copies/mL). A negative result must be combined with clinical observations, patient history, and epidemiological information. The expected result is Negative.  Fact Sheet for Patients:  BloggerCourse.com  Fact Sheet for Healthcare Providers:  SeriousBroker.it  This test is no t yet approved or cleared by the [place] FDA and  has been authorized for detection and/or diagnosis of SARS-CoV-2 by FDA under an Emergency Use Authorization (EUA). This EUA will remain  in effect (meaning this test can be used) for the duration of the COVID-19 declaration under Section 564(b)(1) of the Act, 21 U.S.C.section 360bbb-3(b)(1), unless the authorization is terminated  or revoked sooner.       Influenza A by PCR NEGATIVE NEGATIVE Final   Influenza B by PCR NEGATIVE NEGATIVE Final    Comment: (NOTE) The Xpert Xpress SARS-CoV-2/FLU/RSV plus assay is intended as an aid in the diagnosis of influenza from Nasopharyngeal swab specimens and should not be used as a sole basis for treatment. Nasal washings and aspirates are unacceptable for Xpert Xpress SARS-CoV-2/FLU/RSV testing.  Fact Sheet for Patients: BloggerCourse.com  Fact Sheet for  Healthcare Providers: SeriousBroker.it  This test is not yet approved or cleared by the [place] FDA and has been authorized for detection and/or diagnosis of SARS-CoV-2 by FDA under an Emergency Use Authorization (EUA). This EUA will remain in effect (meaning this test can be used) for the duration of the COVID-19 declaration under Section 564(b)(1) of the Act, 21 U.S.C. section 360bbb-3(b)(1), unless the authorization is terminated or revoked.  Performed at [facility], [address], [place], [place] [address]   MRSA Next Gen by PCR, Nasal     Status: None   Collection Time: 05/30/21 11:39 PM   Specimen: Nasal Mucosa; Nasal Swab  Result Value Ref Range Status   MRSA by PCR Next Gen NOT DETECTED NOT DETECTED Final    Comment: (NOTE) The GeneXpert MRSA Assay (FDA approved for NASAL specimens only), is one component of a comprehensive MRSA colonization surveillance program. It is not intended to diagnose MRSA infection nor to guide or monitor treatment for MRSA infections. Test performance is not FDA approved in patients less than 2 years old. Performed at [facility], [address] [place], [place] [address]      Radiology Studies:  No results found.   Scheduled Meds:    benzocaine   Mouth/Throat TID   chlorhexidine  15 mL Mouth Rinse BID   chlorhexidine  15 mL Mouth Rinse BID   LORazepam  0.25 mg Intravenous BID   mouth rinse  15 mL Mouth Rinse q12n4p   mouth rinse  15 mL Mouth Rinse q12n4p   mirtazapine  7.5 mg Oral QHS    Continuous Infusions:       LOS: 4 days     Marcellus Scott, MD, [place], [facility]. [facility]    To contact the attending provider between 7A-7P or the covering provider during after hours 7P-7A, please log into the web site [email or web address] and access using universal [place] password for that web site. If you do not have the password, please call the hospital operator.  06/03/2021, 3:45  PM

## 2021-06-03 NOTE — Plan of Care (Signed)
  Problem: Clinical Measurements: Goal: Ability to maintain clinical measurements within normal limits will improve Outcome: Progressing Goal: Will remain free from infection Outcome: Progressing Goal: Diagnostic test results will improve Outcome: Progressing Goal: Respiratory complications will improve Outcome: Progressing Goal: Cardiovascular complication will be avoided Outcome: Progressing   Problem: Safety: Goal: Ability to remain free from injury will improve Outcome: Progressing   Problem: Pain Managment: Goal: General experience of comfort will improve Outcome: Progressing   

## 2021-06-03 NOTE — Progress Notes (Addendum)
Daily Progress Note   Patient Name: Brandi Stewart       Date: 06/03/2021 DOB: June 26, 1951  Age: 70 y.o. MRN#: [id number] Attending Physician: Elease Etienne, MD Primary Care Physician: Pcp, No Admit Date: 05/30/2021  Reason for Consultation/Follow-up: Hospice Evaluation, Non pain symptom management, Pain control, and Terminal Care   To discuss complex medical decision making related to patient's goals of care.   Chart reviewed. Patient examined. Discussed disposition and patient status with attending.  Patient's Sons ([name] and [place]) at bedside.  Subjective: Brandi Stewart, 70 y.o. female, is resting in bed. Her sons, Brandi Stewart and Brandi Stewart, are at bedside. Ms. Billard is able to respond to questions with blinking. She seems visibly upset upon PMT arrival.   PMT discussed patient's medical diagnosis and prognosis with her sons. Their questions and concerns were answered. Prior to our meeting, Dr. Waymon Amato reported to Palliative that the patient is dysphagic, ahpasic, and now quadriplegic. She is not expected to recover due to the culmination of debilitating acute events she has undergone. Brandi Stewart and Brandi Stewart question is placing a feeding tube would allow her to improve. PMT discussed that improvement is unlikely. Hospice was discussed and they are in agreement to place her at [facility] place and switch her measures within the hospital to comfort care.   Patient's sons asked her at the end of the conversation is this is what she wants and asked her to blink is she was in agreement. [name] blinked, showing her agreement with their decisions for her to be kept comfortable.   Assessment: 70 y.o. female patient with significant history of multiple strokes leading to now quadriplegia, aphasia, and dysphagia. Patient  is awake and is able to track with her eyes. She is actively crying and moaning, in mild distress. Based on assessment, Patient is able to hear. Pulse is regular, Breath sounds are distant but even bilaterally. Heart sounds distant, no tachycardia. No significant signs/indication of distress related to pain.   Poor prognosis.   Patient Profile/HPI: 70 y.o. 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 Code stroke was called, not a tPA candidate (  outside window).   Last "normal" was the night prior to presentation. She was able to communicate with family last night, but this AM was acutely non verbal and EMS was called. She has been on ASA and clopidogrel for stroke prophylaxis. Son also state she was able to feed herself last night, but her fine motor skills were not as good as they have been.    She was just admitted to [facility] on 6/23 for acute worsening of right sided weakness and aphasia and found to have an acute subcortical infarct. Neurology followed along and felt this was unrelated to presenting complaints. She has been seen at [facility] for multiple previous strokes as well. In November 2021 she had an acute left thalamoscapuslar junction and left basal ganglia infarct and imaging showed a chronic right MCA territory infarct. EEGs were also obtained without evidence of seizures, but she was placed on Keppra for seizure prophylaxis. She continues to be on this.   Length of Stay: 4   Vital Signs: BP 104/78 (BP Location: Left Leg)   Pulse 69   Temp 98.6 F (37 C) (Oral)   Resp 17   Ht 5\' 6"  (1.676 m)   Wt 76.5 kg   SpO2 100%   BMI 27.22 kg/m  SpO2: SpO2: 100 % O2 Device: O2 Device: Room Air O2 Flow Rate: O2 Flow Rate (L/min): 0 L/min       Palliative Assessment/Data: 10% PPS       Palliative Care Plan    Recommendations/Plan: Full comfort Care - Patient's Sons [name] and are in agreement  Orders adjusted for comfort. Plan for discharge to [facility] when bed becomes available.  TOC and ACC notified.   No bed available today. Visitation restrictions lifted  Major aphthous ulcer identified in left side of mouth - oragel ordered TID  Code Status:  DNR  Prognosis:  < 2 weeks due to high likelihood off having another stroke  (inability to speak, eat, or move extremities)   Discharge Planning: Hospice facility - [facility] plan was discussed with Patient's Sons [facility] and Brandi Stewart)   Thank you for allowing the Palliative Medicine Team to assist in the care of this patient.  Total time spent:  35 min     Greater than 50%  of this time was spent counseling and coordinating care related to the above assessment and plan.  Brandi Distance, PA-C Palliative Medicine Norvel Richards, PA-S2   Please contact Palliative MedicineTeam phone at [phone] for questions and concerns between 7 am - 7 pm.   Please see AMION for individual provider pager numbers.

## 2021-06-03 NOTE — Progress Notes (Addendum)
Civil engineer, contracting [facility]) Hospital Liaison note.    Addendum:  3:06pm:  Pt has been deemed eligible for [facility] by [facility] MD.  Addendum: 2:15pm: spoke with pt's son [name] by phone to answer questions related to transfer to [facility].  Contact information given.   Received request from [facility] manager for family interest in [facility]. Chart and pt information under review by [facility] physician.  Hospice eligibility pending at this time.  HIPAA compliant voicemail left for son [name] on [phone].    [facility] is unable to offer a room today. Hospital Liaison will follow up tomorrow or sooner if a room becomes available. Please do not hesitate to call with questions.    Thank you for the opportunity to participate in this patient's care.  Gillian Scarce, BSN, RN [facility] Liaison (listed on [place] under Hospice[facility])    [phone] (24h on call)

## 2021-06-04 MED ORDER — MORPHINE 100MG IN NS 100ML (1MG/ML) PREMIX INFUSION
1.0000 mg/h | INTRAVENOUS | Status: DC
  Administered 2021-06-04: 1 mg/h via INTRAVENOUS
  Filled 2021-06-04: qty 100

## 2021-06-04 MED ORDER — GLYCOPYRROLATE 0.2 MG/ML IJ SOLN
0.2000 mg | Freq: Two times a day (BID) | INTRAMUSCULAR | Status: DC
  Administered 2021-06-04 (×2): 0.2 mg via INTRAVENOUS
  Filled 2021-06-04: qty 1

## 2021-06-04 MED ORDER — MORPHINE BOLUS VIA INFUSION
2.0000 mg | INTRAVENOUS | Status: DC | PRN
  Filled 2021-06-04: qty 2

## 2021-06-04 MED ORDER — LORAZEPAM 2 MG/ML IJ SOLN
0.5000 mg | Freq: Two times a day (BID) | INTRAMUSCULAR | Status: DC
  Administered 2021-06-04 – 2021-06-06 (×5): 0.5 mg via INTRAVENOUS
  Filled 2021-06-04 (×5): qty 1

## 2021-06-04 NOTE — Progress Notes (Signed)
Daily Progress Note   Patient Name: Brandi Stewart       Date: 06/04/2021 DOB: 11-19-1951  Age: 70 y.o. MRN#: [id number] Attending Physician: Brandi Etienne, MD Primary Care Physician: Pcp, No Admit Date: 05/30/2021  Reason for Consultation/Follow-up:  symptom management at EOL   Subjective: Patient able to blink eyes appropriately for yes or no in response to my questions.  She indicates she wants more pain medication and more oral care.     Discussed with Geophysical data processor that patient needs to be cleaned up.  Received a voice mail when I attempted to call Brandi Stewart.  Left a message that we are adjusting his mother's comfort medications and that we will re-round on her again later today to ensure the effectiveness of the changes.  Assessment: Patient waiting on a bed at [facility] after 4 strokes in 7 months.  She is now unable to speak on move, but can blink her eyes.     Patient Profile/HPI:  70 y.o. 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 Code stroke was called, not a tPA candidate (outside window).   Last "normal" was the night prior to presentation. She was able to communicate with family last night, but this AM was acutely non verbal and EMS was called. She has been on ASA and clopidogrel for stroke prophylaxis. Son also state she was able to feed herself last night, but her fine motor skills were not as good as they have been.    She was just admitted to [facility] on 6/23 for acute worsening of right sided weakness and aphasia and found to have an acute subcortical infarct. Neurology  followed along and felt this was unrelated to presenting complaints. She has been seen at [facility] for multiple previous strokes as well. In November 2021 she had an acute left thalamoscapuslar junction and left basal ganglia infarct and imaging showed a chronic right MCA territory infarct. EEGs were also obtained without evidence of seizures, but she was placed on Keppra for seizure prophylaxis. She continues to be on this.    Length of Stay: 5   Vital Signs:  BP 128/80 (BP Location: Right Leg)   Pulse 69   Temp 98.9 F (37.2 C) (Oral)   Resp 18   Ht 5\' 6"  (1.676 m)   Wt 76.5 kg   SpO2 100%   BMI 27.22 kg/m  SpO2: SpO2: 100 % O2 Device: O2 Device: Room Air O2 Flow Rate: O2 Flow Rate (L/min): 0 L/min       Palliative Assessment/Data:  10%     Palliative Care Plan    Recommendations/Plan: Patient indicating discomfort and the desire for increased pain medications.   Will start a low dose morphine gtt.    Increase scheduled antianxiety to 0.5 mg bid. Will re-round later today. Waiting for a bed at [facility].  Code Status:  DNR  Prognosis:  Hours - Days   Discharge Planning: Hospice facility  Care plan was discussed with son and RN  Thank you for allowing the Palliative Medicine Team to assist in the care of this patient.  Total time spent:  35 min.     Greater than 50%  of this time was spent counseling and coordinating care related to the above assessment and plan.  [facility], PA-C Palliative Medicine  Please contact Palliative MedicineTeam phone at [phone] for questions and concerns between 7 am - 7 pm.   Please see AMION for individual provider pager numbers.

## 2021-06-04 NOTE — Progress Notes (Signed)
[facility] ([facility]) Hospital Liaison note.    Chart and pt information have been reviewed by [facility] physician. [facility] eligibility confirmed.  [facility] is unable to offer a room today. Hospital Liaison will follow up tomorrow or sooner if a room becomes available. Please do not hesitate to call with questions.    Thank you for the opportunity to participate in this patient's care.  Chrislyn King, BSN, RN [facility] Hospital Liaison (listed on AMION under Hospice/[facility])    [phone] (24h on call)   

## 2021-06-04 NOTE — Progress Notes (Signed)
PROGRESS NOTE   Rufina Kimery  MQK:[id number]    DOB: 05/19/1951    DOA: 05/30/2021  PCP: Pcp, No   I have briefly reviewed patients previous medical records in [facility].  Chief Complaint  Patient presents with   Code Stroke    Brief Narrative:   70 y.o. 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  She was recently hospitalized 05/25/2021 - 05/27/2021 when MRI brain revealed subcortical infarct, neurology recommended continuing dual antiplatelets x3 weeks then Plavix alone and continuing her Keppra.  Code stroke was called in the ED.  Patient admitted for recurrent acute stroke.  Failed swallow evaluation and MBS, remains n.p.o.  Neurology/stroke team consulted.  Palliative care consulted and family opted to transition patient to full comfort care on 7/2 with eventual discharge to residential hospice.   Assessment & Plan:  Principal Problem:   Acute left hemiparesis (HCC) Active Problems:   History of CVA with residual deficit   Prediabetes   Acute left-sided weakness   Acute CVA (cerebrovascular accident) (HCC)   Acute stroke with left hemiparesis and aphasia: History of multiple prior CVAs.  She now presented with left-sided weakness and nonverbal state.  Not a candidate for tPA or mechanical thrombectomy.  Neurology/stroke service consulted and their input appreciated.  She recently had a full stroke work-up.  MRI this admission shows increase in size of prior right corona radiator stroke.  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   Loop recorder considered however given poor prognosis, neurology decided not to consult EP.  SLP input appreciated, failed swallow evaluation, and MBS, continue n.p.o. and IV fluids in the interim.  EEG shows encephalopathy features without seizures.  Given overall poor prognosis, Palliative care was consulted and family opted to transition patient to full comfort care on 7/2 with eventual discharge to residential hospice.  All medications nonessential to comfort were discontinued.  Acute encephalopathy: Acute on chronic encephalopathy related to multiple strokes.  Essential hypertension/hypotension Antihypertensives discontinued.  Hyperlipidemia Meds nonessential to comfort discontinued.  Prediabetes: A1c 6.2.    Chronic diastolic CHF 2D echo 6/24: LVEF 55-60% and grade 1 diastolic dysfunction.    Dehydration with ketonuria and elevated lactate IV fluids stopped  Body mass index is 27.22 kg/m.  Adult failure to thrive Secondary to recurrent strokes, associated debility and multiple medical problems.  Palliative care consulted and family opted to transition patient to full comfort care on 7/2 with eventual discharge to residential hospice.  Palliative care continues to follow and adjusting medications for comfort.   DVT prophylaxis:      Code Status: DNR Family Communication: None at bedside. Disposition:  Status is: Inpatient  Remains inpatient appropriate because:Inpatient level of care appropriate due to severity of illness  Dispo: The patient is from: SNF              Anticipated d/c is to: Residential hospice pending bed.              Patient currently is medically stable for DC.   Difficult to place patient  No        Consultants:   Neurology Palliative care medicine  Procedures:   None  Antimicrobials:    Anti-infectives (From admission, onward)    None         Subjective:  Nonverbal.  Blinks when I asked her if her children came to visit her  yesterday.  Per palliative care team, blinked indicating she needed more pain meds.  Objective:   Vitals:   06/03/21 0847 06/03/21 1321 06/03/21 2009 06/04/21 0952  BP: 104/78 103/77 128/80 131/89  Pulse: 69 61 69 75  Resp: 17 12 18 20   Temp: 98.6 F (37 C) 98.4 F (36.9 C) 98.9 F (37.2 C) 99.3 F (37.4 C)  TempSrc: Oral Oral Oral Oral  SpO2: 100% 99% 100% 98%  Weight:      Height:        General exam: Elderly female, moderately built and nourished lying in bed and did not appear in any distress.  Some drooling from left angle of the mouth Respiratory system: Clear to auscultation.  No increased work of breathing. Cardiovascular system: S1 and S2 heard, RRR.  No JVD, murmurs or pedal edema.  Telemetry personally reviewed: SB in the 50s-SR-discontinue now that she is comfort care Gastrointestinal system: Abdomen is nondistended, soft and nontender. No organomegaly or masses felt. Normal bowel sounds heard. Central nervous system: Alert this morning.  Tracking with eyes.  Did follow instructions when she blinked her eyes when requested.  Nonverbal.  Facial asymmetry present.   Extremities: Not moving any limbs spontaneously or to touch even today.  Did not provide noxious stimuli.  All extremities with some edema, unclear if dependent from strokes. Skin: No rashes, lesions or ulcers Psychiatry: Judgement and insight impaired.  Mood & affect cannot be assessed    Data Reviewed:   I have personally reviewed following labs and imaging studies   CBC: Recent Labs  Lab 05/30/21 0933 05/30/21 0958  WBC  --  9.9  NEUTROABS  --  8.4*  HGB 14.3 13.3  HCT 42.0 42.9  MCV  --  91.7  PLT  --  363    Basic Metabolic Panel: Recent Labs  Lab 05/30/21 0930 05/30/21 0933 05/31/21 0343  NA 140 143 144  K 4.5 4.1 3.7  CL 108 110 114*  CO2 18*  --  21*  GLUCOSE 122* 125* 111*  BUN 20 25* 22  CREATININE 1.08* 0.80 0.77  CALCIUM 9.6  --  9.6    Liver Function Tests: Recent  Labs  Lab 05/30/21 0930  AST 33  ALT 22  ALKPHOS 79  BILITOT 1.3*  PROT 7.2  ALBUMIN 3.7    CBG: Recent Labs  Lab 05/30/21 0929 05/31/21 0351  GLUCAP 86 104*    Microbiology Studies:   Recent Results (from the past 240 hour(s))  Resp Panel by RT-PCR (Flu A&B, Covid) Nasopharyngeal Swab     Status: None   Collection Time: 05/25/21  3:19 PM   Specimen: Nasopharyngeal Swab; Nasopharyngeal(NP) swabs in vial transport medium  Result Value Ref Range Status   SARS Coronavirus 2 by RT PCR NEGATIVE NEGATIVE Final    Comment: (NOTE) SARS-CoV-2 target nucleic acids are NOT DETECTED.  The SARS-CoV-2 RNA is generally detectable in upper respiratory specimens during the acute phase of infection. The lowest concentration of SARS-CoV-2 viral copies this assay can detect is 138 copies/mL. A negative result does not preclude SARS-Cov-2 infection and should not be used as the sole basis for  treatment or other patient management decisions. A negative result may occur with  improper specimen collection/handling, submission of specimen other than nasopharyngeal swab, presence of viral mutation(s) within the areas targeted by this assay, and inadequate number of viral copies(<138 copies/mL). A negative result must be combined with clinical observations, patient history, and epidemiological information. The expected result is Negative.  Fact Sheet for Patients:  BloggerCourse.com  Fact Sheet for Healthcare Providers:  SeriousBroker.it  This test is no t yet approved or cleared by the [place] FDA and  has been authorized for detection and/or diagnosis of SARS-CoV-2 by FDA under an Emergency Use Authorization (EUA). This EUA will remain  in effect (meaning this test can be used) for the duration of the COVID-19 declaration under Section 564(b)(1) of the Act, 21 U.S.C.section 360bbb-3(b)(1), unless the authorization is terminated  or  revoked sooner.       Influenza A by PCR NEGATIVE NEGATIVE Final   Influenza B by PCR NEGATIVE NEGATIVE Final    Comment: (NOTE) The Xpert Xpress SARS-CoV-2/FLU/RSV plus assay is intended as an aid in the diagnosis of influenza from Nasopharyngeal swab specimens and should not be used as a sole basis for treatment. Nasal washings and aspirates are unacceptable for Xpert Xpress SARS-CoV-2/FLU/RSV testing.  Fact Sheet for Patients: BloggerCourse.com  Fact Sheet for Healthcare Providers: SeriousBroker.it  This test is not yet approved or cleared by the [place] FDA and has been authorized for detection and/or diagnosis of SARS-CoV-2 by FDA under an Emergency Use Authorization (EUA). This EUA will remain in effect (meaning this test can be used) for the duration of the COVID-19 declaration under Section 564(b)(1) of the Act, 21 U.S.C. section 360bbb-3(b)(1), unless the authorization is terminated or revoked.  Performed at [facility], [address] [place], [place] [address]   Culture, blood (routine x 2)     Status: Abnormal   Collection Time: 05/25/21  8:57 PM   Specimen: BLOOD LEFT HAND  Result Value Ref Range Status   Specimen Description BLOOD LEFT HAND  Final   Special Requests   Final    BOTTLES DRAWN AEROBIC ONLY Blood Culture adequate volume   Culture  Setup Time   Final    AEROBIC BOTTLE ONLY GRAM POSITIVE COCCI CRITICAL RESULT CALLED TO, READ BACK BY AND VERIFIED WITH: L SEAY PHARMD 05/26/21 2303 JDW    Culture (A)  Final    STREPTOCOCCUS MITIS/ORALIS THE SIGNIFICANCE OF ISOLATING THIS ORGANISM FROM A SINGLE SET OF BLOOD CULTURES WHEN MULTIPLE SETS ARE DRAWN IS UNCERTAIN. PLEASE NOTIFY THE MICROBIOLOGY DEPARTMENT WITHIN ONE WEEK IF SPECIATION AND SENSITIVITIES ARE REQUIRED. Performed at [facility], [address], [place], [place] [address]    Report Status 05/28/2021 FINAL  Final  Culture,  blood (routine x 2)     Status: None   Collection Time: 05/25/21  8:57 PM   Specimen: BLOOD  Result Value Ref Range Status   Specimen Description BLOOD SITE NOT SPECIFIED  Final   Special Requests   Final    BOTTLES DRAWN AEROBIC AND ANAEROBIC Blood Culture adequate volume   Culture   Final    NO GROWTH 5 DAYS Performed at [facility], [address] [place], [place] [address]    Report Status 05/31/2021 FINAL  Final  Blood Culture ID Panel (Reflexed)     Status: Abnormal   Collection Time: 05/25/21  8:57 PM  Result Value Ref Range Status   Enterococcus faecalis NOT  DETECTED NOT DETECTED Final   Enterococcus Faecium NOT DETECTED NOT DETECTED Final   Listeria monocytogenes NOT DETECTED NOT DETECTED Final   Staphylococcus species NOT DETECTED NOT DETECTED Final   Staphylococcus aureus (BCID) NOT DETECTED NOT DETECTED Final   Staphylococcus epidermidis NOT DETECTED NOT DETECTED Final   Staphylococcus lugdunensis NOT DETECTED NOT DETECTED Final   Streptococcus species DETECTED (A) NOT DETECTED Final    Comment: Not Enterococcus species, Streptococcus agalactiae, Streptococcus pyogenes, or Streptococcus pneumoniae. CRITICAL RESULT CALLED TO, READ BACK BY AND VERIFIED WITH: L SEAY PHARMD 05/26/21 2303 JDW    Streptococcus agalactiae NOT DETECTED NOT DETECTED Final   Streptococcus pneumoniae NOT DETECTED NOT DETECTED Final   Streptococcus pyogenes NOT DETECTED NOT DETECTED Final   A.calcoaceticus-baumannii NOT DETECTED NOT DETECTED Final   Bacteroides fragilis NOT DETECTED NOT DETECTED Final   Enterobacterales NOT DETECTED NOT DETECTED Final   Enterobacter cloacae complex NOT DETECTED NOT DETECTED Final   Escherichia coli NOT DETECTED NOT DETECTED Final   Klebsiella aerogenes NOT DETECTED NOT DETECTED Final   Klebsiella oxytoca NOT DETECTED NOT DETECTED Final   Klebsiella pneumoniae NOT DETECTED NOT DETECTED Final   Proteus species NOT DETECTED NOT DETECTED Final   Salmonella  species NOT DETECTED NOT DETECTED Final   Serratia marcescens NOT DETECTED NOT DETECTED Final   Haemophilus influenzae NOT DETECTED NOT DETECTED Final   Neisseria meningitidis NOT DETECTED NOT DETECTED Final   Pseudomonas aeruginosa NOT DETECTED NOT DETECTED Final   Stenotrophomonas maltophilia NOT DETECTED NOT DETECTED Final   Candida albicans NOT DETECTED NOT DETECTED Final   Candida auris NOT DETECTED NOT DETECTED Final   Candida glabrata NOT DETECTED NOT DETECTED Final   Candida krusei NOT DETECTED NOT DETECTED Final   Candida parapsilosis NOT DETECTED NOT DETECTED Final   Candida tropicalis NOT DETECTED NOT DETECTED Final   Cryptococcus neoformans/gattii NOT DETECTED NOT DETECTED Final    Comment: Performed at [facility], [address] [place], [place] [address]  Urine culture     Status: Abnormal   Collection Time: 05/30/21  9:53 AM   Specimen: Urine, Random  Result Value Ref Range Status   Specimen Description URINE, RANDOM  Final   Special Requests   Final    NONE Performed at [facility], [address] [place], [place] [address]    Culture 10,000 COLONIES/mL ESCHERICHIA COLI (A)  Final   Report Status 06/01/2021 FINAL  Final   Organism ID, Bacteria ESCHERICHIA COLI (A)  Final      Susceptibility   Escherichia coli - MIC*    AMPICILLIN <=2 SENSITIVE Sensitive     CEFAZOLIN <=4 SENSITIVE Sensitive     CEFEPIME <=0.12 SENSITIVE Sensitive     CEFTRIAXONE <=0.25 SENSITIVE Sensitive     CIPROFLOXACIN <=0.25 SENSITIVE Sensitive     GENTAMICIN <=1 SENSITIVE Sensitive     IMIPENEM <=0.25 SENSITIVE Sensitive     NITROFURANTOIN <=16 SENSITIVE Sensitive     TRIMETH/SULFA <=20 SENSITIVE Sensitive     AMPICILLIN/SULBACTAM <=2 SENSITIVE Sensitive     PIP/TAZO <=4 SENSITIVE Sensitive     * 10,000 COLONIES/mL ESCHERICHIA COLI  Resp Panel by RT-PCR (Flu A&B, Covid) Urine, Catheterized     Status: None   Collection Time: 05/30/21 10:30 AM   Specimen: Urine,  Catheterized; Nasopharyngeal(NP) swabs in vial transport medium  Result Value Ref Range Status   SARS Coronavirus 2 by RT PCR NEGATIVE NEGATIVE Final    Comment: (NOTE) SARS-CoV-2 target nucleic acids are  NOT DETECTED.  The SARS-CoV-2 RNA is generally detectable in upper respiratory specimens during the acute phase of infection. The lowest concentration of SARS-CoV-2 viral copies this assay can detect is 138 copies/mL. A negative result does not preclude SARS-Cov-2 infection and should not be used as the sole basis for treatment or other patient management decisions. A negative result may occur with  improper specimen collection/handling, submission of specimen other than nasopharyngeal swab, presence of viral mutation(s) within the areas targeted by this assay, and inadequate number of viral copies(<138 copies/mL). A negative result must be combined with clinical observations, patient history, and epidemiological information. The expected result is Negative.  Fact Sheet for Patients:  BloggerCourse.com  Fact Sheet for Healthcare Providers:  SeriousBroker.it  This test is no t yet approved or cleared by the [place] FDA and  has been authorized for detection and/or diagnosis of SARS-CoV-2 by FDA under an Emergency Use Authorization (EUA). This EUA will remain  in effect (meaning this test can be used) for the duration of the COVID-19 declaration under Section 564(b)(1) of the Act, 21 U.S.C.section 360bbb-3(b)(1), unless the authorization is terminated  or revoked sooner.       Influenza A by PCR NEGATIVE NEGATIVE Final   Influenza B by PCR NEGATIVE NEGATIVE Final    Comment: (NOTE) The Xpert Xpress SARS-CoV-2/FLU/RSV plus assay is intended as an aid in the diagnosis of influenza from Nasopharyngeal swab specimens and should not be used as a sole basis for treatment. Nasal washings and aspirates are unacceptable for Xpert  Xpress SARS-CoV-2/FLU/RSV testing.  Fact Sheet for Patients: BloggerCourse.com  Fact Sheet for Healthcare Providers: SeriousBroker.it  This test is not yet approved or cleared by the [place] FDA and has been authorized for detection and/or diagnosis of SARS-CoV-2 by FDA under an Emergency Use Authorization (EUA). This EUA will remain in effect (meaning this test can be used) for the duration of the COVID-19 declaration under Section 564(b)(1) of the Act, 21 U.S.C. section 360bbb-3(b)(1), unless the authorization is terminated or revoked.  Performed at [facility], [address], [place], [place] [address]   MRSA Next Gen by PCR, Nasal     Status: None   Collection Time: 05/30/21 11:39 PM   Specimen: Nasal Mucosa; Nasal Swab  Result Value Ref Range Status   MRSA by PCR Next Gen NOT DETECTED NOT DETECTED Final    Comment: (NOTE) The GeneXpert MRSA Assay (FDA approved for NASAL specimens only), is one component of a comprehensive MRSA colonization surveillance program. It is not intended to diagnose MRSA infection nor to guide or monitor treatment for MRSA infections. Test performance is not FDA approved in patients less than 35 years old. Performed at [facility], [address] [place], [place] [address]      Radiology Studies:  No results found.   Scheduled Meds:    benzocaine   Mouth/Throat TID   chlorhexidine  15 mL Mouth Rinse BID   chlorhexidine  15 mL Mouth Rinse BID   LORazepam  0.5 mg Intravenous BID   mouth rinse  15 mL Mouth Rinse q12n4p   mouth rinse  15 mL Mouth Rinse q12n4p   mirtazapine  7.5 mg Oral QHS    Continuous Infusions:    morphine 1 mg/hr (06/04/21 1000)      LOS: 5 days     Marcellus Scott, MD, [place], [facility]. [facility]    To contact the attending provider between 7A-7P or the covering provider  during after hours 7P-7A, please log into the web site  [email or web address] and access using universal  password for that web site. If you do not have the password, please call the hospital operator.  06/04/2021, 12:40 PM

## 2021-06-05 MED ORDER — GLYCOPYRROLATE 0.2 MG/ML IJ SOLN
0.4000 mg | Freq: Three times a day (TID) | INTRAMUSCULAR | Status: DC
  Administered 2021-06-05 – 2021-06-06 (×4): 0.4 mg via INTRAVENOUS
  Filled 2021-06-05 (×4): qty 2

## 2021-06-05 NOTE — Plan of Care (Signed)
  Problem: Education: Goal: Knowledge of disease or condition will improve Outcome: Progressing Goal: Knowledge of secondary prevention will improve Outcome: Progressing Goal: Knowledge of patient specific risk factors addressed and post discharge goals established will improve Outcome: Progressing   Problem: Nutrition: Goal: Risk of aspiration will decrease Outcome: Progressing Goal: Dietary intake will improve Outcome: Progressing

## 2021-06-05 NOTE — Progress Notes (Signed)
PROGRESS NOTE   Brandi Stewart  WVP:[id number]    DOB: 12-28-1950    DOA: 05/30/2021  PCP: Pcp, No   I have briefly reviewed patients previous medical records in [facility].  Chief Complaint  Patient presents with   Code Stroke    Brief Narrative:   70 y.o. 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  She was recently hospitalized 05/25/2021 - 05/27/2021 when MRI brain revealed subcortical infarct, neurology recommended continuing dual antiplatelets x3 weeks then Plavix alone and continuing her Keppra.  Code stroke was called in the ED.  Patient admitted for recurrent acute stroke.  Failed swallow evaluation and MBS, remains n.p.o.  Neurology/stroke team consulted.  Palliative care consulted and family opted to transition patient to full comfort care on 7/2 with eventual discharge to residential hospice.   Assessment & Plan:  Principal Problem:   Acute left hemiparesis (HCC) Active Problems:   History of CVA with residual deficit   Prediabetes   Acute left-sided weakness   Acute CVA (cerebrovascular accident) (HCC)   Acute stroke with left hemiparesis and aphasia: History of multiple prior CVAs.  She now presented with left-sided weakness and nonverbal state.  Not a candidate for tPA or mechanical thrombectomy.  Neurology/stroke service consulted and their input appreciated.  She recently had a full stroke work-up.  MRI this admission shows increase in size of prior right corona radiator stroke.  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   Loop recorder considered however given poor prognosis, neurology decided not to consult EP.  SLP input appreciated, failed swallow evaluation, and MBS, continue n.p.o. and IV fluids in the interim.  EEG shows encephalopathy features without seizures.  Given overall poor prognosis, Palliative care was consulted and family opted to transition patient to full comfort care on 7/2 with eventual discharge to residential hospice.  All medications nonessential to comfort were discontinued.  Awaiting [facility] bed.  Acute encephalopathy: Acute on chronic encephalopathy related to multiple strokes.  Essential hypertension/hypotension Antihypertensives discontinued.  Hyperlipidemia Meds nonessential to comfort discontinued.  Prediabetes: A1c 6.2.    Chronic diastolic CHF 2D echo 6/24: LVEF 55-60% and grade 1 diastolic dysfunction.    Dehydration with ketonuria and elevated lactate IV fluids stopped  Body mass index is 27.22 kg/m.  Adult failure to thrive Secondary to recurrent strokes, associated debility and multiple medical problems.  Palliative care consulted and family opted to transition patient to full comfort care on 7/2 with eventual discharge to residential hospice.  Palliative care continues to follow and adjusting medications for comfort.  Now appears comfortable.  Starting to further decline.   DVT prophylaxis:      Code Status: DNR Family Communication: Discussed with son [name] at bedside. Disposition:  Status is: Inpatient  Remains inpatient appropriate because:Inpatient level of care appropriate due to severity of illness  Dispo: The patient is from: SNF              Anticipated d/c is to: Residential hospice pending bed.  Patient currently is medically stable for DC.   Difficult to place patient No        Consultants:   Neurology Palliative care medicine  Procedures:   None  Antimicrobials:    Anti-infectives (From admission, onward)    None          Subjective:  Not as alert as yesterday.  Not tracking with eyes.  However still blinks with responses when son Alycia RossettiRyan asked her couple questions at bedside.  Objective:   Vitals:   06/03/21 2009 06/04/21 0952 06/04/21 2023 06/05/21 0921  BP: 128/80 131/89 101/78 92/79  Pulse: 69 75 79 61  Resp: 18 20 14 15   Temp: 98.9 F (37.2 C) 99.3 F (37.4 C) 98.2 F (36.8 C) 99.6 F (37.6 C)  TempSrc: Oral Oral Oral Oral  SpO2: 100% 98% 98% 98%  Weight:      Height:        General exam: Elderly female, moderately built and nourished lying in bed.  Appeared comfortable and in no distress. Respiratory system: Poor inspiratory effort.  Clear to auscultation. Cardiovascular system: S1 and S2 heard, RRR.  No JVD, murmurs or pedal edema.  Telemetry discontinued. Gastrointestinal system: Abdomen is nondistended, soft and nontender. No organomegaly or masses felt. Normal bowel sounds heard. Central nervous system: Alert but less so compared to yesterday.  Not tracking with eyes.  Still blinks to questions.  Remains nonverbal.  Facial asymmetry present.   Extremities: Not moving any limbs spontaneously or to touch even today.  Did not provide noxious stimuli.  All extremities with some edema, unclear if dependent from strokes. Skin: No rashes, lesions or ulcers Psychiatry: Judgement and insight impaired.  Mood & affect cannot be assessed    Data Reviewed:   I have personally reviewed following labs and imaging studies   CBC: Recent Labs  Lab 05/30/21 0933 05/30/21 0958  WBC  --  9.9  NEUTROABS  --  8.4*  HGB 14.3 13.3  HCT 42.0 42.9  MCV  --  91.7  PLT  --  363    Basic Metabolic Panel: Recent Labs  Lab 05/30/21 0930 05/30/21 0933 05/31/21 0343  NA 140 143 144  K 4.5 4.1 3.7  CL 108 110 114*  CO2 18*  --  21*  GLUCOSE 122* 125* 111*  BUN 20 25* 22  CREATININE 1.08* 0.80 0.77  CALCIUM 9.6  --  9.6    Liver Function Tests: Recent Labs  Lab 05/30/21 0930  AST  33  ALT 22  ALKPHOS 79  BILITOT 1.3*  PROT 7.2  ALBUMIN 3.7    CBG: Recent Labs  Lab 05/30/21 0929 05/31/21 0351  GLUCAP 86 104*    Microbiology Studies:   Recent Results (from the past 240 hour(s))  Urine culture     Status: Abnormal   Collection Time: 05/30/21  9:53 AM   Specimen: Urine, Random  Result Value Ref Range Status   Specimen Description URINE, RANDOM  Final   Special Requests   Final    NONE Performed at [facility], [address] [place], [place] [address]    Culture 10,000 COLONIES/mL ESCHERICHIA COLI (A)  Final   Report Status 06/01/2021 FINAL  Final   Organism ID, Bacteria ESCHERICHIA COLI (A)  Final      Susceptibility   Escherichia coli - MIC*    AMPICILLIN <=2 SENSITIVE Sensitive     CEFAZOLIN <=4 SENSITIVE Sensitive     CEFEPIME <=0.12 SENSITIVE Sensitive  CEFTRIAXONE <=0.25 SENSITIVE Sensitive     CIPROFLOXACIN <=0.25 SENSITIVE Sensitive     GENTAMICIN <=1 SENSITIVE Sensitive     IMIPENEM <=0.25 SENSITIVE Sensitive     NITROFURANTOIN <=16 SENSITIVE Sensitive     TRIMETH/SULFA <=20 SENSITIVE Sensitive     AMPICILLIN/SULBACTAM <=2 SENSITIVE Sensitive     PIP/TAZO <=4 SENSITIVE Sensitive     * 10,000 COLONIES/mL ESCHERICHIA COLI  Resp Panel by RT-PCR (Flu A&B, Covid) Urine, Catheterized     Status: None   Collection Time: 05/30/21 10:30 AM   Specimen: Urine, Catheterized; Nasopharyngeal(NP) swabs in vial transport medium  Result Value Ref Range Status   SARS Coronavirus 2 by RT PCR NEGATIVE NEGATIVE Final    Comment: (NOTE) SARS-CoV-2 target nucleic acids are NOT DETECTED.  The SARS-CoV-2 RNA is generally detectable in upper respiratory specimens during the acute phase of infection. The lowest concentration of SARS-CoV-2 viral copies this assay can detect is 138 copies/mL. A negative result does not preclude SARS-Cov-2 infection and should not be used as the sole basis for treatment or other patient management decisions. A  negative result may occur with  improper specimen collection/handling, submission of specimen other than nasopharyngeal swab, presence of viral mutation(s) within the areas targeted by this assay, and inadequate number of viral copies(<138 copies/mL). A negative result must be combined with clinical observations, patient history, and epidemiological information. The expected result is Negative.  Fact Sheet for Patients:  BloggerCourse.com  Fact Sheet for Healthcare Providers:  SeriousBroker.it  This test is no t yet approved or cleared by the [place] FDA and  has been authorized for detection and/or diagnosis of SARS-CoV-2 by FDA under an Emergency Use Authorization (EUA). This EUA will remain  in effect (meaning this test can be used) for the duration of the COVID-19 declaration under Section 564(b)(1) of the Act, 21 U.S.C.section 360bbb-3(b)(1), unless the authorization is terminated  or revoked sooner.       Influenza A by PCR NEGATIVE NEGATIVE Final   Influenza B by PCR NEGATIVE NEGATIVE Final    Comment: (NOTE) The Xpert Xpress SARS-CoV-2/FLU/RSV plus assay is intended as an aid in the diagnosis of influenza from Nasopharyngeal swab specimens and should not be used as a sole basis for treatment. Nasal washings and aspirates are unacceptable for Xpert Xpress SARS-CoV-2/FLU/RSV testing.  Fact Sheet for Patients: BloggerCourse.com  Fact Sheet for Healthcare Providers: SeriousBroker.it  This test is not yet approved or cleared by the [place] FDA and has been authorized for detection and/or diagnosis of SARS-CoV-2 by FDA under an Emergency Use Authorization (EUA). This EUA will remain in effect (meaning this test can be used) for the duration of the COVID-19 declaration under Section 564(b)(1) of the Act, 21 U.S.C. section 360bbb-3(b)(1), unless the authorization  is terminated or revoked.  Performed at [facility], [address] [place], [place] [address]   MRSA Next Gen by PCR, Nasal     Status: None   Collection Time: 05/30/21 11:39 PM   Specimen: Nasal Mucosa; Nasal Swab  Result Value Ref Range Status   MRSA by PCR Next Gen NOT DETECTED NOT DETECTED Final    Comment: (NOTE) The GeneXpert MRSA Assay (FDA approved for NASAL specimens only), is one component of a comprehensive MRSA colonization surveillance program. It is not intended to diagnose MRSA infection nor to guide or monitor treatment for MRSA infections. Test performance is not FDA approved in patients less than 35 years old. Performed at [facility]  [facility], [address] [place], [place] [address]      Radiology Studies:  No results found.   Scheduled Meds:    benzocaine   Mouth/Throat TID   chlorhexidine  15 mL Mouth Rinse BID   chlorhexidine  15 mL Mouth Rinse BID   glycopyrrolate  0.4 mg Intravenous TID   LORazepam  0.5 mg Intravenous BID   mouth rinse  15 mL Mouth Rinse q12n4p   mouth rinse  15 mL Mouth Rinse q12n4p   mirtazapine  7.5 mg Oral QHS    Continuous Infusions:    morphine 1 mg/hr (06/04/21 1000)      LOS: 6 days     Marcellus Scott, MD, [place], [facility]. [facility]    To contact the attending provider between 7A-7P or the covering provider during after hours 7P-7A, please log into the web site [email or web address] and access using universal [place] password for that web site. If you do not have the password, please call the hospital operator.  06/05/2021, 1:19 PM

## 2021-06-05 NOTE — Progress Notes (Signed)
Daily Progress Note   Patient Name: Brandi Stewart       Date: 06/05/2021 DOB: 09-21-1951  Age: 70 y.o. MRN#: [id number] Attending Physician: Elease Etienne, MD Primary Care Physician: Pcp, No Admit Date: 05/30/2021  Reason for Consultation/Follow-up: Symptom management   Subjective: Patient non responsive and not tracking today.  Appears comfortable.  Has some coarse expiratory breath sounds that may be a slight moan or snore.  Slightly gurgly.    No family at bedside.    Assessment: Patient actively dying.  No longer tracking or interacting.  Per Nurse Tech no urine since yesterday.   Patient Profile/HPI:  70 y.o. 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 Code stroke was called, not a tPA candidate (outside window).   Last "normal" was the night prior to presentation. She was able to communicate with family last night, but this AM was acutely non verbal and EMS was called. She has been on ASA and clopidogrel for stroke prophylaxis. Son also state she was able to feed herself last night, but her fine motor skills were not as good as they have been.    She was just admitted to [facility] on 6/23 for acute worsening of right sided weakness and aphasia and found to have an acute subcortical infarct. Neurology followed along and felt this was unrelated to presenting complaints. She has been seen at [facility] for multiple previous strokes as well. In November 2021 she had an acute left thalamoscapuslar junction and left basal ganglia infarct and imaging showed a chronic right MCA territory  infarct. EEGs were also obtained without evidence of seizures, but she was placed on Keppra for seizure prophylaxis. She continues to be on this.    Length of Stay: 6   Vital Signs: BP 92/79 (BP Location: Left Leg)   Pulse 61   Temp 99.6 F (37.6 C) (Oral) Comment: RN Notified  Resp 15   Ht 5\' 6"  (1.676 m)   Wt 76.5 kg   SpO2 98%   BMI 27.22 kg/m  SpO2: SpO2: 98 % O2 Device: O2 Device: Room Air O2 Flow Rate: O2  Flow Rate (L/min): 0 L/min       Palliative Assessment/Data:  10%     Palliative Care Plan    Recommendations/Plan: Continue current care - comfort measures only. In the next day or so patient may not be able to transport any longer - but currently still waiting on a BP bed. Called [name] to discuss the changes with him. Appreciate excellent nursing care patient is receiving.  Code Status:  DNR  Prognosis:  Hours - Days   Discharge Planning: To Be Determined Hospice facility vs Hospital death.  Care plan was discussed with family and RN Tech.  Thank you for allowing the Palliative Medicine Team to assist in the care of this patient.  Total time spent:  15 min.     Greater than 50%  of this time was spent counseling and coordinating care related to the above assessment and plan.  Norvel Richards, PA-C Palliative Medicine  Please contact Palliative MedicineTeam phone at [phone] for questions and concerns between 7 am - 7 pm.   Please see AMION for individual provider pager numbers.

## 2021-06-05 NOTE — Care Management Important Message (Signed)
Important Message  Patient Details  Name: Brandi Stewart MRN: [id number] Date of Birth: [birth year]   Medicare Important Message Given:  Yes     Fatuma Dowers Stefan Church 06/05/2021, 12:51 PM

## 2021-06-05 NOTE — TOC Initial Note (Signed)
Transition of Care [facility]) - Initial/Assessment Note    Patient Details  Name: Minette Manders MRN: [id number] Date of Birth: 08/19/1951  Transition [facility] Care [facility]) CM/SW Contact:    Baldemar Lenis, LCSW Phone Number: 06/05/2021, 12:29 PM  Clinical Narrative:      CSW following for transition to [facility]. No bed available today. CSW to continue to follow.             Expected Discharge Plan: Hospice Medical Facility Barriers to Discharge: Hospice Bed not available, Continued Medical Work up   Patient Goals and CMS Choice Patient states their goals for this hospitalization and ongoing recovery are:: patient unable to participate in goal setting, not oriented CMS Medicare.gov Compare Post Acute Care list provided to:: Patient Represenative (must comment) Choice offered to / list presented to : Adult Children  Expected Discharge Plan and Services Expected Discharge Plan: Hospice Medical Facility     Post Acute Care Choice: Hospice Living arrangements for the past 2 months: Skilled Nursing Facility                                      Prior Living Arrangements/Services Living arrangements for the past 2 months: Skilled Nursing Facility Lives with:: Facility Resident Patient language and need for interpreter reviewed:: No Do you feel safe going back to the place where you live?: Yes      Need for Family Participation in Patient Care: Yes (Comment) Care giver support system in place?: Yes (comment)   Criminal Activity/Legal Involvement Pertinent to Current Situation/Hospitalization: No - Comment as needed  Activities of Daily Living      Permission Sought/Granted Permission sought to share information with : Facility Medical sales representative, Family Supports Permission granted to share information with : Yes, Verbal Permission Granted  Share Information with NAME: [name]  Permission granted to share info w AGENCY: Training and development officer granted to share info w  Relationship: Son     Emotional Assessment   Attitude/Demeanor/Rapport: Unable to Assess Affect (typically observed): Unable to Assess   Alcohol / Substance Use: Not Applicable Psych Involvement: No (comment)  Admission diagnosis:  Acute left-sided weakness [R53.1] Left-sided weakness [R53.1] Acute CVA (cerebrovascular accident) [facility]) [I63.9] Patient Active Problem List   Diagnosis Date Noted   Acute left hemiparesis (HCC) 05/30/2021   History of CVA with residual deficit 05/30/2021   Prediabetes 05/30/2021   Acute left-sided weakness 05/30/2021   Acute CVA (cerebrovascular accident) (HCC) 05/30/2021   Essential hypertension 05/25/2021   Dyslipidemia 05/25/2021   PCP:  Oneita Hurt, No Pharmacy:   [facility], [place] - [address] [place] [address] [place] [address] [place] [address] Phone: [phone] Fax: [phone]     Social Determinants of Health (SDOH) Interventions    Readmission Risk Interventions No flowsheet data found.

## 2021-06-05 NOTE — Progress Notes (Signed)
Civil engineer, contracting [facility]) Hospital Liaison note.     Chart and pt information has been reviewed by [facility] physician. [facility] eligibility confirmed.   [facility] is unable to offer a room today. Hospital Liaison will follow up tomorrow or sooner if a room becomes available. Please do not hesitate to call with questions.     Thank you for the opportunity to participate in this patient's care.  Yolande Jolly, RN, [facility]  [phone]

## 2021-06-06 MED ORDER — LORAZEPAM 2 MG/ML IJ SOLN: 0.5000 mg | Freq: Two times a day (BID) | INTRAMUSCULAR | Status: AC

## 2021-06-06 MED ORDER — GLYCOPYRROLATE 0.2 MG/ML IJ SOLN: 0.2000 mg | INTRAMUSCULAR | Status: AC | PRN

## 2021-06-06 MED ORDER — GLYCOPYRROLATE 0.2 MG/ML IJ SOLN: 0.4000 mg | Freq: Three times a day (TID) | INTRAMUSCULAR | Status: AC

## 2021-06-06 MED ORDER — LORAZEPAM 2 MG/ML IJ SOLN: 0.5000 mg | INTRAMUSCULAR | Status: AC | PRN

## 2021-06-06 MED ORDER — POLYVINYL ALCOHOL 1.4 % OP SOLN: 1.0000 [drp] | Freq: Four times a day (QID) | OPHTHALMIC | Status: AC | PRN

## 2021-06-06 MED ORDER — MORPHINE SULFATE (PF) 2 MG/ML IV SOLN: 1.0000 mg | INTRAVENOUS | Status: AC | PRN

## 2021-06-06 MED ORDER — MORPHINE 100MG IN NS 100ML (1MG/ML) PREMIX INFUSION: 1.0000 mg/h | INTRAVENOUS | Status: AC

## 2021-06-06 NOTE — TOC Transition Note (Signed)
Transition of Care [facility]) - CM/SW Discharge Note   Patient Details  Name: Brandi Stewart MRN: [id number] Date of Birth: 1951/05/03  Transition of Care [facility]) CM/SW Contact:  Baldemar Lenis, LCSW Phone Number: 06/06/2021, 1:20 PM   Clinical Narrative:   Nurse to call report to [phone].    Final next level of care: Hospice Medical Facility Barriers to Discharge: Barriers Resolved   Patient Goals and CMS Choice Patient states their goals for this hospitalization and ongoing recovery are:: patient unable to participate in goal setting, not oriented CMS Medicare.gov Compare Post Acute Care list provided to:: Patient Represenative (must comment) Choice offered to / list presented to : Adult Children  Discharge Placement              Patient chooses bed at:  [facility]) Patient to be transferred to facility by: [facility] Name of family member notified: [name] Patient and family notified of of transfer: 06/06/21  Discharge Plan and Services     Post Acute Care Choice: Hospice                               Social Determinants of Health (SDOH) Interventions     Readmission Risk Interventions No flowsheet data found.

## 2021-06-06 NOTE — Plan of Care (Signed)
  Problem: Education: Goal: Knowledge of disease or condition will improve Outcome: Progressing Goal: Knowledge of secondary prevention will improve Outcome: Progressing Goal: Knowledge of patient specific risk factors addressed and post discharge goals established will improve Outcome: Progressing   Problem: Nutrition: Goal: Risk of aspiration will decrease Outcome: Progressing Goal: Dietary intake will improve Outcome: Progressing   Problem: Ischemic Stroke/TIA Tissue Perfusion: Goal: Complications of ischemic stroke/TIA will be minimized Outcome: Progressing   Problem: Spontaneous Subarachnoid Hemorrhage Tissue Perfusion: Goal: Complications of Spontaneous Subarachnoid Hemorrhage will be minimized Outcome: Progressing

## 2021-06-06 NOTE — Progress Notes (Signed)
This chaplain responded to PMT consult for spiritual care. The chaplain was updated by the PMT and read the chart notes before the visit.   The Pt. family is not at the bedside. The chaplain shared a moment of sacred silence and intercessory prayer with the Pt.

## 2021-06-06 NOTE — Progress Notes (Signed)
Civil engineer, contracting [facility])  [facility] registration paperwork and consents are complete and [facility] is ready for patient transfer once RN has called report to [phone]. TOC aware.  Thank you for the opportunity to participate in this patient's care,  Roda Shutters, RN [facility] HLT [phone]

## 2021-06-06 NOTE — Progress Notes (Signed)
[facility]) Hospital Liaison note.     This patient is approved to transfer to [facility] today.    [facility] will notify TOC when registration paperwork has been completed to arrange transport.    RN please call report to [phone].   Thank you,     Mary Anne Robertson, RN, CCM       [facility] Hospital Liaison  [phone] 

## 2021-06-06 NOTE — Discharge Summary (Signed)
Physician Discharge Summary  Brandi Stewart ZOX:[id number] DOB: 1951-03-10  PCP: Pcp, No  Admitted from: SNF Discharged to: Residential hospice/[facility]  Admit date: 05/30/2021 Discharge date: 06/06/2021  Recommendations for Outpatient Follow-up:    Contact information for follow-up providers     MD at [facility] Follow up.   Why: Will continue to follow for end-of-life hospice care.             Contact information for after-discharge care     Destination     [facility] .   Service: Skilled Nursing Contact information: [address] [place] [address] [phone]                      Home Health: None    Equipment/Devices: None    Discharge Condition: Guarded and poor with expected decline and death   Code Status: DNR Diet recommendation: NPO due to total dysphagia.    Discharge Diagnoses:  Principal Problem:   Acute left hemiparesis (HCC) Active Problems:   History of CVA with residual deficit   Prediabetes   Acute left-sided weakness   Acute CVA (cerebrovascular accident) [facility])   Brief Summary: 70 y.o. 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  She was recently hospitalized 05/25/2021 - 05/27/2021 when MRI brain revealed subcortical infarct, neurology recommended continuing dual antiplatelets x3 weeks then Plavix alone and continuing her Keppra.  Code stroke was called in the ED.  Patient admitted for recurrent acute stroke.  Failed swallow evaluation and MBS, remains n.p.o.  Neurology/stroke team consulted.  Palliative care consulted and family opted to transition patient to full comfort care on 7/2 with eventual discharge to residential hospice.     Assessment & Plan:    Acute stroke with left hemiparesis and  aphasia: History of multiple prior CVAs.  She now presented with left-sided weakness and nonverbal state.  Not a candidate for tPA or mechanical thrombectomy.  Neurology/stroke service consulted and their input appreciated.  She recently had a full stroke work-up.  MRI this admission shows increase in size of prior right corona radiator stroke.  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  Loop recorder considered however given poor prognosis, neurology decided not to consult EP.  SLP input appreciated, failed swallow evaluation, and MBS, continue n.p.o.  EEG shows encephalopathy features without seizures.  Given overall poor prognosis, Palliative care was consulted and family opted to transition patient to full comfort care on 7/2 with eventual discharge to residential hospice.  All medications nonessential to comfort were discontinued.     Acute encephalopathy: Acute on chronic encephalopathy related to multiple strokes.   Essential hypertension/hypotension Antihypertensives discontinued.  Hyperlipidemia Meds nonessential to comfort discontinued.  Prediabetes: A1c 6.2.     Chronic diastolic CHF 2D echo 6/24: LVEF 55-60% and grade 1 diastolic dysfunction.     Dehydration with ketonuria and elevated lactate IV fluids stopped   Body mass index is 27.22 kg/m.   Adult failure to thrive Secondary to recurrent strokes, associated debility and multiple medical problems.  Palliative care consulted and family opted to transition patient to full comfort care on 7/2 with eventual discharge to residential hospice.  Palliative care continued to follow and adjusting medications for comfort.  Now appears comfortable.  Starting to further decline.           Consultants:   Neurology Palliative care medicine   Discharge Instructions  Discharge Instructions     Bed rest   Complete by: As directed    Call MD for:  difficulty breathing, headache or visual disturbances   Complete by: As directed    Call MD for:  persistant nausea and vomiting   Complete by: As directed    Call MD for:  severe uncontrolled pain   Complete by: As directed    Call MD for:  temperature >100.4   Complete by: As directed    Discharge instructions   Complete by: As directed    NPO.        Medication List     STOP taking these medications    albuterol 108 (90 Base) MCG/ACT inhaler Commonly known as: VENTOLIN HFA   aspirin 81 MG EC tablet   atorvastatin 40 MG tablet Commonly known as: LIPITOR   clopidogrel 75 MG tablet Commonly known as: PLAVIX   levETIRAcetam 500 MG tablet Commonly known as: KEPPRA   melatonin 3 MG Tabs tablet   mirtazapine 7.5 MG tablet Commonly known as: REMERON   telmisartan 40 MG tablet Commonly known as: MICARDIS       TAKE these medications    glycopyrrolate 0.2 MG/ML injection Commonly known as: ROBINUL Inject 2 mLs (0.4 mg total) into the vein 3 (three) times daily.   glycopyrrolate 0.2 MG/ML injection Commonly known as: ROBINUL Inject 1 mL (0.2 mg total) into the vein every 4 (four) hours as needed (excessive secretions).   LORazepam 2 MG/ML injection Commonly known as: ATIVAN Inject 0.25 mLs (0.5 mg total) into the vein every 4 (four) hours as needed for anxiety or sedation.   LORazepam 2 MG/ML injection Commonly known as: ATIVAN Inject 0.25 mLs (0.5 mg total) into the vein 2 (two) times daily.   morphine 1 mg/mL Soln infusion Inject 1 mg/hr into the vein continuous.   morphine 2 MG/ML injection Inject 0.5-1 mLs (1-2 mg total) into the vein every hour as needed (Severe pain or dyspnea).   polyvinyl alcohol 1.4 % ophthalmic solution Commonly known as: LIQUIFILM TEARS Place 1 drop into  both eyes 4 (four) times daily as needed for dry eyes.       Allergies  Allergen Reactions   Other     Statins-HMG-coA- Reductase Inhibitors  Per MAR   Statins Other (See Comments)    Not on MAR      Procedures/Studies: CT ANGIO HEAD NECK W WO CM  Result Date: 05/31/2021 CLINICAL DATA:  Stroke. Evaluate for intracranial atherosclerotic disease. EXAM: CT ANGIOGRAPHY HEAD AND NECK TECHNIQUE: Multidetector CT imaging of the head and neck was performed using the standard protocol during bolus administration of intravenous contrast. Multiplanar CT image reconstructions and MIPs were obtained to evaluate the vascular anatomy. Carotid stenosis measurements (when applicable) are obtained utilizing NASCET criteria, using the distal internal carotid diameter as the denominator. CONTRAST:  60mL OMNIPAQUE IOHEXOL 350 MG/ML SOLN COMPARISON:  CT angiogram of the head and neck October 10, 2020. FINDINGS: CT HEAD FINDINGS Brain: Hypodensity within the right basal ganglia region and corona radiata consistent with acute infarcts seen on recent MRI of the brain. Areas of  encephalomalacia and gliosis in the right frontoparietal and occipital regions. Hypodensity of the periventricular white matter, nonspecific, most likely related to chronic small vessel ischemia. No hemorrhage, hydrocephalus, extra-axial collection or mass lesion. Vascular: No hyperdense vessel or unexpected calcification. Skull: Normal. Negative for fracture or focal lesion. Sinuses: Imaged portions are clear. Orbits: No acute finding. Review of the MIP images confirms the above findings CTA NECK FINDINGS Aortic arch: Standard branching. Imaged portion shows no evidence of aneurysm or dissection. Mild atherosclerotic changes of the aortic arch. No significant stenosis of the major arch vessel origins. Right carotid system: Atherosclerotic changes of the right carotid bifurcation without hemodynamically significant stenosis. Increased tortuosity of  the cervical segment of the right ICA. Left carotid system: Atherosclerotic changes of the left carotid bifurcation without hemodynamically significant stenosis. Vertebral arteries: Dominant right vertebral artery with normal course and caliber. Mild luminal irregularities along the course of the non dominant left vertebral artery without hemodynamically significant stenosis in the neck. Skeleton: Degenerative changes of the cervical spine. Other neck: Mildly heterogeneous thyroid gland with a 6 mm hypodense right thyroid lobe nodule. Upper chest: No acute findings.  Left subclavian Port-A-Cath. Review of the MIP images confirms the above findings CTA HEAD FINDINGS Anterior circulation: Normal caliber of the intracranial internal carotid arteries. Luminal irregularities along the bilateral ACA and MCA vascular trees, consistent with intracranial atherosclerotic disease. New focal area of mild stenosis in the left M1 segment and moderate stenosis in M3 segments. Stable mild stenosis at the distal right M1 segment. Multifocal areas of severe stenosis at the right A2/ACA segment with occlusion at the A2-A3 junction with minimal opacification of distal branches. Severe stenosis at the left A3/ACA segment with diminutive caliber distally, worsened when compared to prior CTA. Posterior circulation: Atherosclerotic changes of the right vertebral artery without hemodynamically significant stenosis. Atherosclerotic changes of the left vertebral artery with occlusion near the vertebrobasilar junction. The basilar artery is diminutive in caliber with luminal irregularity and mild stenosis at the mid basilar segment. A fetal right PCA is noted. Luminal irregularity of the bilateral posterior cerebral arteries with unchanged multifocal areas of severe stenosis at the right P2-P3 junction and P4 segments. Mild stenosis at the left V2 and V3 segments. Venous sinuses: As permitted by contrast timing, patent. Anatomic variants:  Right fetal PCA. Review of the MIP images confirms the above findings IMPRESSION: 1. Hypodense foci within the right basal ganglia and corona radiata corresponding to acute infarct seen on recent MRI of the brain. 2. Remote right frontoparietal and occipital infarcts. 3. Mild atherosclerotic disease in the major vessels of the neck without hemodynamically significant stenosis. 4. Intracranial atherosclerotic disease, as described above, progressed from prior CT angiogram. New moderate stenosis at left M3/MCA segments with stable mild bilateral M1 segment stenosis. 5. 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 6. Severe stenosis at left A3 ACA segment with diminutive caliber distally, worsened when compared to prior CTA. 7. New occlusion of the distal left vertebral artery at the vertebrobasilar junction. 8. Atherosclerotic disease of the bilateral posterior cerebral arteries with multifocal areas of severe stenosis at the right PCA, unchanged. Electronically Signed   By: Baldemar Lenis M.D.   On: 05/31/2021 14:12   CT HEAD WO CONTRAST  Result Date: 05/30/2021 CLINICAL DATA:  Seizure EXAM: CT HEAD WITHOUT CONTRAST TECHNIQUE: Contiguous axial images were obtained from the base of the skull through the vertex without intravenous contrast. COMPARISON:  May 25, 2021 head CT and brain MRI FINDINGS: Brain: Note degree of motion artifact. Mild diffuse atrophy is stable. There is no intracranial mass, hemorrhage, extra-axial fluid collection, or midline shift. Evidence of prior infarct at the right parieto-occipital junction is stable. There are lacunar type infarcts in the left lentiform nucleus as well as in each thalamus. There is decreased attenuation throughout portions of the centra semiovale bilaterally, stable. There is evidence of a prior infarct in the posterosuperior right frontal lobe. No appreciable acute infarct evident. Vascular: No  hyperdense vessels evident. Calcification noted in each carotid siphon region. Skull: Bony calvarium appears intact. Sinuses/Orbits: Paranasal sinuses clear. Orbits symmetric bilaterally. Other: Mastoid air cells clear. IMPRESSION: There is a degree of motion artifact making this study less than optimal. Stable atrophy with prior infarcts at several sites, primarily on the right. There is periventricular small vessel disease. No acute infarct is demonstrated on this study. No mass or hemorrhage. Foci of arterial vascular calcification. Electronically Signed   By: Bretta Bang III M.D.   On: 05/30/2021 09:49   MR ANGIO HEAD WO CONTRAST  Result Date: 05/25/2021 CLINICAL DATA:  Neuro deficit, acute, stroke suspected. Additional history provided: Worsening right-sided weakness and aphasia. EXAM: MRA HEAD WITHOUT CONTRAST TECHNIQUE: Angiographic images of the Circle of Willis were acquired using MRA technique without intravenous contrast. COMPARISON:  Brain MRI 01/11/2021. CT angiogram head/neck 10/10/2020. FINDINGS: Anterior circulation: The intracranial internal carotid arteries are patent. The M1 middle cerebral arteries are patent. Progressive severe stenosis within the distal M1 right middle cerebral artery. Atherosclerotic irregularity of the M2 and more distal middle cerebral arteries bilaterally. However, no M2 proximal branch occlusion or high-grade proximal M2 stenosis is identified. Redemonstrated multifocal severe stenoses within the A2 and A3 right anterior cerebral artery. Flow related enhancement is poorly appreciated within the distal anterior cerebral artery. On the prior CTA head of 10/10/2020, only minimal thready enhancement of the distal right ACA was appreciated. The left anterior cerebral artery is patent. New severe stenosis versus artifact within the A3/A4 left anterior cerebral artery (series 307, image 2). Posterior circulation: The dominant intracranial right vertebral artery is  patent without significant stenosis. The non dominant intracranial left vertebral artery is irregular and poorly delineated beyond the left PICA origin. This is at least partially related to developmentally small vessel size. There may be superimposed high-grade stenosis. The basilar artery is patent. The posterior cerebral arteries are patent. Redemonstrated high-grade stenosis within the P3 right posterior cerebral artery. Known high-grade stenosis within the P4 right PCA better appreciated on the prior CTA. The right posterior cerebral artery is fetal in origin. The left posterior communicating artery is hypoplastic or absent. Anatomic variants: As described IMPRESSION: Intracranial atherosclerotic disease with multifocal stenoses, most notably as follows. Progressive severe stenosis within the distal M1 right middle cerebral artery. Redemonstrated multifocal severe stenoses within the A2 and A3 right anterior cerebral artery. Flow related enhancement is poorly appreciated within the right anterior cerebral artery distal to this. On the prior CTA of 10/10/2020, this portion of the right anterior cerebral artery demonstrated only minimal thready enhancement. New severe stenosis versus artifact within the A3/A4 left anterior cerebral artery. The non dominant intracranial left vertebral artery is irregular and poorly delineated beyond the left PICA origin. This is at least partially related to developmentally small vessel size. There may be superimposed high-grade stenosis. 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 Electronically Signed   By: Jackey Loge DO  On: 05/25/2021 17:49   MR ANGIO NECK WO CONTRAST  Result Date: 05/25/2021 EXAM: MRA NECK WITHOUT CONTRAST TECHNIQUE: Multiplanar and multiecho pulse sequences of the neck were obtained without intravenous contrast. Angiographic images of the neck were obtained using MRA technique without and with intravenous contrast.  COMPARISON:  CT angiogram neck 10/10/2020. FINDINGS: Examination significantly limited by moderate to severe motion degradation and noncontrast technique. The common carotid and internal carotid arteries are patent within the neck. Within described limitations, no definite hemodynamically significant stenosis of the common carotid or internal carotid arteries within the neck is identified. The vertebral arteries are patent within the neck with antegrade flow bilaterally. Motion degradation significantly limits evaluation for stenoses within these vessels. The right vertebral artery is dominant. IMPRESSION: Examination significantly limited by moderate to severe motion degradation and non-contrast technique. The common carotid and internal carotid arteries are patent within the neck. Within described limitations, no definite hemodynamically significant stenosis of the common carotid or internal carotid arteries is identified within the neck. The vertebral arteries are patent within the neck with antegrade flow bilaterally. Motion degradation significantly limits evaluation for stenoses within these vessels. Electronically Signed   By: Jackey Loge DO   On: 05/25/2021 18:09   MR BRAIN WO CONTRAST  Result Date: 05/30/2021 CLINICAL DATA:  Neuro deficit, acute, stroke suspected. EXAM: MRI HEAD WITHOUT CONTRAST TECHNIQUE: Multiplanar, multiecho pulse sequences of the brain and surrounding structures were obtained without intravenous contrast. COMPARISON:  Prior head CT examinations 05/30/2021 and earlier. Brain MRI 05/25/2021. FINDINGS: Brain: Intermittently motion degraded examination, limiting evaluation. Most notably, there is severe motion degradation of the axial T1 weighted sequence, severe motion degradation of the sagittal T1 weighted sequence, moderate motion degradation of the axial T2 FLAIR sequence and severe motion degradation of the coronal T2 weighted sequence. Mild generalized cerebral and cerebellar  atrophy. The diffusion-weighted imaging is of good quality. Acute/early subacute infarction changes within the right corona radiata have increased in size as compared to the prior brain MRI of 05/25/2021, now spanning a region measuring 2.3 x 1.0 x 2.2 cm (AP x TV x CC). Additionally, there is a new subcentimeter acute infarct within the right external capsule (series 3, image 26). Redemonstrated chronic cortical/subcortical infarcts within the right frontal, parietal and occipital lobes. Redemonstrated chronic lacunar infarcts within the right cerebral hemispheric white matter, bilateral basal ganglia and bilateral thalami. Background moderate multifocal T2/FLAIR hyperintensity within the cerebral white matter, nonspecific but compatible with chronic small vessel ischemic disease. Tiny chronic lacunar infarct within the inferior left cerebellar hemisphere. No evidence of an intracranial mass. No chronic intracranial blood products. No extra-axial fluid collection. No midline shift. Vascular: Expected proximal arterial flow voids. Skull and upper cervical spine: No focal marrow lesion is identified within the limitations of motion degradation. Sinuses/Orbits: Visualized orbits show no acute finding. No significant paranasal sinus disease. IMPRESSION: Motion degraded examination, as described and limiting evaluation. Acute/early subacute infarction changes within the right corona radiata have increased in size from the brain MRI of 05/25/2021, now measuring 2.3 x 1.0 x 2.2 cm. Additionally, there is a new subcentimeter acute infarct within the right external capsule. Chronic cortical/subcortical infarcts within the right cerebral hemisphere, multiple chronic lacunar infarcts, background cerebral white matter chronic small vessel ischemic disease and generalized parenchymal atrophy, as detailed. Electronically Signed   By: Jackey Loge DO   On: 05/30/2021 14:20   MR BRAIN WO CONTRAST  Result Date:  05/25/2021 CLINICAL DATA:  Neuro deficit, acute, stroke suspected.  Additional history provided: Patient reports worsening right-sided weakness and aphasia. EXAM: MRI HEAD WITHOUT CONTRAST TECHNIQUE: Multiplanar, multiecho pulse sequences of the brain and surrounding structures were obtained without intravenous contrast. COMPARISON:  Noncontrast head CT performed earlier today 05/25/2021. Brain MRI 01/11/2021. CT angiogram head/neck 10/10/2020. FINDINGS: The patient was unable to tolerate the full examination. As a result, only axial and coronal diffusion-weighted sequences (and the corresponding ADC sequences) could be obtained. There is moderate motion degradation of the coronal diffusion-weighted sequence. 2.0 x 0.8 x 1.8 cm focus of restricted diffusion within the right corona radiata compatible with acute infarction. No acute infarct is identified elsewhere within the brain. IMPRESSION: The patient was unable to tolerate the full examination. As a result, only diffusion-weighted imaging could be obtained. The acquired coronal diffusion-weighted sequence is moderately motion degraded. 2.0 x 0.8 x 1.8 cm acute infarct within the right corona radiata. No acute infarct identified elsewhere within the brain. Electronically Signed   By: Jackey LogeKyle  Golden DO   On: 05/25/2021 17:33   DG Chest Portable 1 View  Result Date: 05/30/2021 CLINICAL DATA:  70 year old female with left side weakness and aphasia. Cough. EXAM: PORTABLE CHEST 1 VIEW COMPARISON:  Portable chest 05/25/2021. FINDINGS: Portable AP semi upright view at 1000 hours. Stable left chest Port-A-Cath. Mildly improved lung volumes. Mediastinal contours are stable and within normal limits. Thoracolumbar scoliosis. Allowing for portable technique the lungs are clear. Stable right axillary or chest wall surgical clips. No acute osseous abnormality identified. Negative visible bowel gas pattern. IMPRESSION: No acute cardiopulmonary abnormality. Electronically Signed    By: Odessa FlemingH  Hall M.D.   On: 05/30/2021 10:10   DG Chest Portable 1 View  Result Date: 05/25/2021 CLINICAL DATA:  Shortness of breath. EXAM: PORTABLE CHEST 1 VIEW COMPARISON:  None. FINDINGS: Left chest wall port a catheter noted with tip in the projection of the SVC. Normal heart size. No pleural effusion or interstitial edema. No airspace consolidation. Scoliosis deformity identified within the thoracic spine, convex towards the right. IMPRESSION: No acute cardiopulmonary abnormalities. Electronically Signed   By: Signa Kellaylor  Stroud M.D.   On: 05/25/2021 18:42   DG Swallowing Func-Speech Pathology  Result Date: 06/01/2021 Formatting of this result is different from the original. Objective Swallowing Evaluation: Type of Study: MBS-Modified Barium Swallow Study  Patient Details Name: Brandi LevyBrenton Cohron MRN: [id number] Date of Birth: 03/13/1951 Today's Date: 06/01/2021 Time: SLP Start Time (ACUTE ONLY): 1045 -SLP Stop Time (ACUTE ONLY): 1115 SLP Time Calculation (min) (ACUTE ONLY): 30 min Past Medical History: Past Medical History: Diagnosis Date  Cognitive communication deficit   CVA (cerebral vascular accident) (HCC)   Dyslipidemia   Dysphagia following unspecified cerebrovascular disease   Dysphagia, oropharyngeal phase   Essential hypertension   Hemiplegia, unspecified affecting right dominant side (HCC)   Muscle weakness (generalized)   Other abnormalities of gait and mobility   Personal history of malignant neoplasm of breast  Past Surgical History: No past surgical history on file. HPI: 70 y/o female recently discharged on 6/25 following newfound acute infarct within R corona radiata, now presented to ED on 6/28 with worsening L sided weakness and aphasia. MRI found R corona radiata stroke size is increased from 05/25/2021 and new subcentimeter acute infarct within R external capsule. Per neuro notes from recent admission, pt was able to speak with evidence of aphasia and dysarthria; followed basic commands. PMH: left  CVA in 2010 and 10/2020 with residual R weakness and aphasia, HTN, HLD  Subjective: alert Assessment / Plan /  Recommendation [facility] IP CLINICAL IMPRESSIONS 06/01/2021 Clinical Impression Pt presents with a severe oropharyngeal dysphagia marked by severely delayed if not absent swallow response and subsequent aspiration of materials (thin liquids and purees). Pt required max cues (tactile/verbal) to generate tongue movement to propel material to back of pharynx - barium spilled from lips and there were minimal attempts to achieve a cohesive bolus that could be transferred to throat. Aspiration occurred during and after the swallow due to residue that collected then spilled from the hypopharynx into the larynx. Aspiration did not elicit a cough response.  At this time, swallow is severely impaired.  Recommend continuing NPO; Palliative care consult is pending.  Family will benefit from discussion re: alternative nutrition vs a comfort approach to care. SLP will follow. SLP Visit Diagnosis Dysphagia, oropharyngeal phase (R13.12) Attention and concentration deficit following -- Frontal lobe and executive function deficit following -- Impact on safety and function Severe aspiration risk   [facility] IP TREATMENT RECOMMENDATION 06/01/2021 Treatment Recommendations Therapy as outlined in treatment plan below   Prognosis 06/01/2021 Prognosis for Safe Diet Advancement Guarded Barriers to Reach Goals -- Barriers/Prognosis Comment -- [facility] IP DIET RECOMMENDATION 06/01/2021 SLP Diet Recommendations NPO Liquid Administration via -- Medication Administration -- Compensations -- Postural Changes --   CHL IP OTHER RECOMMENDATIONS 06/01/2021 Recommended Consults -- Oral Care Recommendations Oral care QID Other Recommendations --   CH[facility] IP FOLLOW UP RECOMMENDATIONS 06/01/2021 Follow up Recommendations Skilled Nursing facility   [facility] IP FREQUENCY AND DURATION 06/01/2021 Speech Therapy Frequency (ACUTE ONLY) min 3x week Treatment Duration 2 weeks      [facility]  IP ORAL PHASE 06/01/2021 Oral Phase Impaired Oral - Pudding Teaspoon -- Oral - Pudding Cup -- Oral - Honey Teaspoon -- Oral - Honey Cup -- Oral - Nectar Teaspoon -- Oral - Nectar Cup -- Oral - Nectar Straw -- Oral - Thin Teaspoon Left anterior bolus loss;Impaired mastication;Weak lingual manipulation;Incomplete tongue to palate contact;Reduced posterior propulsion;Holding of bolus;Left pocketing in lateral sulci;Pocketing in anterior sulcus;Lingual/palatal residue;Decreased bolus cohesion;Delayed oral transit Oral - Thin Cup -- Oral - Thin Straw -- Oral - Puree Left anterior bolus loss;Weak lingual manipulation;Reduced posterior propulsion;Holding of bolus;Left pocketing in lateral sulci;Pocketing in anterior sulcus;Lingual/palatal residue;Delayed oral transit;Decreased bolus cohesion Oral - Mech Soft -- Oral - Regular -- Oral - Multi-Consistency -- Oral - Pill -- Oral Phase - Comment --  CHL IP PHARYNGEAL PHASE 06/01/2021 Pharyngeal Phase Impaired Pharyngeal- Pudding Teaspoon -- Pharyngeal -- Pharyngeal- Pudding Cup -- Pharyngeal -- Pharyngeal- Honey Teaspoon -- Pharyngeal -- Pharyngeal- Honey Cup -- Pharyngeal -- Pharyngeal- Nectar Teaspoon -- Pharyngeal -- Pharyngeal- Nectar Cup -- Pharyngeal -- Pharyngeal- Nectar Straw -- Pharyngeal -- Pharyngeal- Thin Teaspoon Delayed swallow initiation-pyriform sinuses;Penetration/Aspiration before swallow;Penetration/Aspiration during swallow;Penetration/Apiration after swallow;Trace aspiration;Pharyngeal residue - pyriform;Pharyngeal residue - valleculae Pharyngeal Material enters airway, passes BELOW cords without attempt by patient to eject out (silent aspiration) Pharyngeal- Thin Cup -- Pharyngeal -- Pharyngeal- Thin Straw -- Pharyngeal -- Pharyngeal- Puree Reduced pharyngeal peristalsis;Penetration/Apiration after swallow;Trace aspiration;Pharyngeal residue - valleculae;Pharyngeal residue - pyriform Pharyngeal Material enters airway, passes BELOW cords without attempt by  patient to eject out (silent aspiration) Pharyngeal- Mechanical Soft -- Pharyngeal -- Pharyngeal- Regular -- Pharyngeal -- Pharyngeal- Multi-consistency -- Pharyngeal -- Pharyngeal- Pill -- Pharyngeal -- Pharyngeal Comment --  No flowsheet data found. 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  EEG to evaluate for seizures. Level of alertness: Awake, asleep AEDs during EEG study: Keppra Technical aspects: This EEG study was done with scalp electrodes positioned according to the 10-20 International system of electrode placement. Electrical activity was acquired at a sampling rate of 500Hz  and reviewed with a high frequency filter of 70Hz  and a low frequency filter of 1Hz . EEG data were recorded continuously and digitally stored. Description: The posterior dominant rhythm consists of 8 Hz activity of moderate voltage (25-35 uV) seen predominantly in posterior head regions, symmetric and reactive to eye opening and eye closing. Sleep was characterized by vertex waves, sleep spindles (12 to 14 Hz), maximal frontocentral region.  EEG showed intermittent generalized and maximal left frontotemporal 3 to 6 Hz theta-delta slowing. Hyperventilation and photic stimulation were not performed.   ABNORMALITY - Intermittent slow, generalized and maximal left frontotemporal region IMPRESSION: This study suggestive of nonspecific cortical dysfunction in left frontotemporal region.  Additionally there is mild diffuse encephalopathy, nonspecific etiology.  No seizures or definite epileptiform  discharges were seen throughout the recording. Brandi Stewart   EEG adult  Result Date: 05/26/2021 Brandi Quest, MD     05/26/2021  1:32 PM Patient Name: Brandi Stewart MRN: [id number] Epilepsy Attending: Charlsie Stewart Referring Physician/Provider: Lanae Boast, NP Date: 05/26/2021 Duration: 22.13 mins Patient history: 70 year old female presented with right hemiparesis and worsening aphasia.  EEG to evaluate for seizures. Level of alertness: Awake, asleep AEDs during EEG study: LEV Technical aspects: This EEG study was done with scalp electrodes positioned according to the 10-20 International system of electrode placement. Electrical activity was acquired at a sampling rate of 500Hz  and reviewed with a high frequency filter of 70Hz  and a low frequency filter of 1Hz . EEG data were recorded continuously and digitally stored. Description: The posterior dominant rhythm consists of 8-9 Hz activity of moderate voltage (25-35 uV) seen predominantly in posterior head regions, symmetric and reactive to eye opening and eye closing. Sleep was characterized by vertex waves, sleep spindles (12 to 14 Hz), maximal frontocentral region. EEG showed intermittent generalized and maximal left frontotemporal region 3 to 6 Hz theta-delta slowing. Hyperventilation and photic stimulation were not performed.   ABNORMALITY - Intermittent slow, generalized and maximal left frontotemporal region IMPRESSION: This study is suggestive of nonspecific cortical dysfunction in left frontotemporal region.  Additionally there is mild diffuse encephalopathy, nonspecific etiology.  No seizures or definite epileptiform discharges were seen throughout the recording. Brandi Stewart   ECHOCARDIOGRAM COMPLETE  Result Date: 05/26/2021    ECHOCARDIOGRAM REPORT   Patient Name:   Brandi Stewart Date of Exam: 05/26/2021 Medical Rec #:  [id number]     Height:       66.0 in Accession #:    [id number]    Weight:       170.0 lb Date of Birth:   11-Feb-1951     BSA:          1.866 m Patient Age:    69 years      BP:           148/74 mmHg Patient Gender: F             HR:  67 bpm. Exam Location:  Inpatient Procedure: 2D Echo, Cardiac Doppler, Color Doppler and Intracardiac            Opacification Agent Indications:    Stroke  History:        Patient has no prior history of Echocardiogram examinations.                 Stroke; Risk Factors:Hypertension and Dyslipidemia. Past history                 of breast cancer/.  Sonographer:    Leta Jungling RDCS Referring Phys: [id number] ALLISON WOLFE IMPRESSIONS  1. Left ventricular ejection fraction, by estimation, is 55 to 60%. The left ventricle has normal function. The left ventricle has no regional wall motion abnormalities. There is mild left ventricular hypertrophy. Left ventricular diastolic parameters are consistent with Grade I diastolic dysfunction (impaired relaxation). Elevated left atrial pressure.  2. Right ventricular systolic function is normal. The right ventricular size is normal.  3. The mitral valve is normal in structure. Trivial mitral valve regurgitation. No evidence of mitral stenosis.  4. The aortic valve is calcified. Aortic valve regurgitation is not visualized. No aortic stenosis is present. FINDINGS  Left Ventricle: Left ventricular ejection fraction, by estimation, is 55 to 60%. The left ventricle has normal function. The left ventricle has no regional wall motion abnormalities. Definity contrast agent was given IV to delineate the left ventricular  endocardial borders. The left ventricular internal cavity size was normal in size. There is mild left ventricular hypertrophy. Left ventricular diastolic parameters are consistent with Grade I diastolic dysfunction (impaired relaxation). Elevated left atrial pressure. Right Ventricle: The right ventricular size is normal. Right ventricular systolic function is normal. Left Atrium: Left atrial size was normal in size. Right Atrium:  Right atrial size was normal in size. Pericardium: There is no evidence of pericardial effusion. Mitral Valve: The mitral valve is normal in structure. Mild mitral annular calcification. Trivial mitral valve regurgitation. No evidence of mitral valve stenosis. Tricuspid Valve: The tricuspid valve is normal in structure. Tricuspid valve regurgitation is trivial. No evidence of tricuspid stenosis. Aortic Valve: The aortic valve is calcified. Aortic valve regurgitation is not visualized. No aortic stenosis is present. Pulmonic Valve: The pulmonic valve was not well visualized. Pulmonic valve regurgitation is not visualized. No evidence of pulmonic stenosis. Aorta: The aortic root is normal in size and structure. Venous: The inferior vena cava was not well visualized. IAS/Shunts: The interatrial septum was not well visualized.  LEFT VENTRICLE PLAX 2D LVIDd:         3.30 cm     Diastology LVIDs:         2.40 cm     LV e' medial:    4.25 cm/s LV PW:         1.00 cm     LV E/e' medial:  14.8 LV IVS:        1.30 cm     LV e' lateral:   6.65 cm/s LVOT diam:     2.20 cm     LV E/e' lateral: 9.4 LV SV:         75 LV SV Index:   40 LVOT Area:     3.80 cm  LV Volumes (MOD) LV vol d, MOD A2C: 62.3 ml LV vol d, MOD A4C: 66.2 ml LV vol s, MOD A2C: 17.7 ml LV vol s, MOD A4C: 16.4 ml LV SV MOD A2C:     44.6 ml  LV SV MOD A4C:     66.2 ml LV SV MOD BP:      49.2 ml RIGHT VENTRICLE RV S prime:     11.20 cm/s LEFT ATRIUM             Index       RIGHT ATRIUM          Index LA diam:        2.20 cm 1.18 cm/m  RA Area:     7.57 cm LA Vol (A2C):   29.6 ml 15.86 ml/m RA Volume:   10.90 ml 5.84 ml/m LA Vol (A4C):   30.0 ml 16.07 ml/m LA Biplane Vol: 31.8 ml 17.04 ml/m  AORTIC VALVE LVOT Vmax:   81.30 cm/s LVOT Vmean:  63.400 cm/s LVOT VTI:    0.196 m  AORTA Ao Root diam: 2.70 cm MITRAL VALVE               TRICUSPID VALVE MV Area (PHT): 2.19 cm    TR Peak grad:   16.8 mmHg MV Decel Time: 346 msec    TR Vmax:        205.00 cm/s MV E  velocity: 62.80 cm/s MV A velocity: 87.30 cm/s  SHUNTS MV E/A ratio:  0.72        Systemic VTI:  0.20 m                            Systemic Diam: 2.20 cm Olga Millers MD Electronically signed by Olga Millers MD Signature Date/Time: 05/26/2021/12:28:32 PM    Final    CT HEAD CODE STROKE WO CONTRAST  Result Date: 05/25/2021 CLINICAL DATA:  Code stroke.  Comment EXAM: CT HEAD WITHOUT CONTRAST TECHNIQUE: Contiguous axial images were obtained from the base of the skull through the vertex without intravenous contrast. COMPARISON:  None. FINDINGS: Brain: No evidence of acute large vascular territory infarction, hemorrhage, hydrocephalus, extra-axial collection or mass lesion/mass effect. 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 Additional remote infarcts in the right MCA territory. Advanced chronic microvascular ischemic disease. Moderate atrophy with ex vacuo ventricular dilation. Vascular: No hyperdense vessel identified. Skull: No acute fracture. Sinuses/Orbits: Clear sinuses.  Unremarkable orbits. Other: No mastoid effusions. ASPECTS [facility] Stroke Program Early CT Score) total score (0-10 with 10 being normal): 10. IMPRESSION: 1. No evidence of acute large vascular territory infarct or acute hemorrhage. ASPECTS is 10. 2. 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 Code stroke imaging results were communicated on 05/25/2021 at 2:38 pm to provider Dr. Pearlean Brownie via 2:37 p.m. Electronically Signed   By: Feliberto Harts MD   On: 05/25/2021 14:41      Subjective: Nonverbal.  Somewhat drowsy but arousable to call, opens eyes but not sure if she is consistently blinking today in response to questions.  Discharge Exam:  Vitals:   06/04/21 2023 06/05/21 0921 06/05/21 2025 06/06/21 0756  BP: 101/78 92/79 (!) 109/44 108/84  Pulse: 79 61 81 61  Resp: 14 15 10 15   Temp: 98.2 F (36.8 C) 99.6 F (37.6 C) 98.6 F  (37 C) 98.5 F (36.9 C)  TempSrc: Oral Oral Oral Oral  SpO2: 98% 98% 100% 98%  Weight:      Height:        General exam: Elderly female, moderately built and nourished lying in bed.  Appeared comfortable and in no distress. Respiratory system: Poor inspiratory effort.  Clear to auscultation.  No apneic spells noted.  Breathing even and unlabored. Cardiovascular system: S1 and S2 heard, RRR.  No JVD, murmurs or pedal edema.  Telemetry discontinued. Gastrointestinal system: Abdomen is nondistended, soft and nontender. No organomegaly or masses felt. Normal bowel sounds heard. Central nervous system: Mental status as noted above.  Not tracking with eyes.  Remains nonverbal.  Facial asymmetry present.   Extremities: Not moving any limbs spontaneously or to touch even today.  Did not provide noxious stimuli.  All extremities with some edema, unclear if dependent from strokes. Skin: No rashes, lesions or ulcers Psychiatry: Judgement and insight impaired.  Mood & affect cannot be assessed    The results of significant diagnostics from this hospitalization (including imaging, microbiology, ancillary and laboratory) are listed below for reference.     Microbiology: Recent Results (from the past 240 hour(s))  Urine culture     Status: Abnormal   Collection Time: 05/30/21  9:53 AM   Specimen: Urine, Random  Result Value Ref Range Status   Specimen Description URINE, RANDOM  Final   Special Requests   Final    NONE Performed at [facility], [address] [place], [place] [address]    Culture 10,000 COLONIES/mL ESCHERICHIA COLI (A)  Final   Report Status 06/01/2021 FINAL  Final   Organism ID, Bacteria ESCHERICHIA COLI (A)  Final      Susceptibility   Escherichia coli - MIC*    AMPICILLIN <=2 SENSITIVE Sensitive     CEFAZOLIN <=4 SENSITIVE Sensitive     CEFEPIME <=0.12 SENSITIVE Sensitive     CEFTRIAXONE <=0.25 SENSITIVE Sensitive     CIPROFLOXACIN <=0.25 SENSITIVE Sensitive      GENTAMICIN <=1 SENSITIVE Sensitive     IMIPENEM <=0.25 SENSITIVE Sensitive     NITROFURANTOIN <=16 SENSITIVE Sensitive     TRIMETH/SULFA <=20 SENSITIVE Sensitive     AMPICILLIN/SULBACTAM <=2 SENSITIVE Sensitive     PIP/TAZO <=4 SENSITIVE Sensitive     * 10,000 COLONIES/mL ESCHERICHIA COLI  Resp Panel by RT-PCR (Flu A&B, Covid) Urine, Catheterized     Status: None   Collection Time: 05/30/21 10:30 AM   Specimen: Urine, Catheterized; Nasopharyngeal(NP) swabs in vial transport medium  Result Value Ref Range Status   SARS Coronavirus 2 by RT PCR NEGATIVE NEGATIVE Final    Comment: (NOTE) SARS-CoV-2 target nucleic acids are NOT DETECTED.  The SARS-CoV-2 RNA is generally detectable in upper respiratory specimens during the acute phase of infection. The lowest concentration of SARS-CoV-2 viral copies this assay can detect is 138 copies/mL. A negative result does not preclude SARS-Cov-2 infection and should not be used as the sole basis for treatment or other patient management decisions. A negative result may occur with  improper specimen collection/handling, submission of specimen other than nasopharyngeal swab, presence of viral mutation(s) within the areas targeted by this assay, and inadequate number of viral copies(<138 copies/mL). A negative result must be combined with clinical observations, patient history, and epidemiological information. The expected result is Negative.  Fact Sheet for Patients:  BloggerCourse.com  Fact Sheet for Healthcare Providers:  SeriousBroker.it  This test is no t yet approved or cleared by the [place] FDA and  has been authorized for detection and/or diagnosis of SARS-CoV-2 by FDA under an Emergency Use Authorization (EUA). This EUA will remain  in effect (meaning this test can be used) for the duration of the COVID-19 declaration under Section 564(b)(1) of the Act, 21 U.S.C.section  360bbb-3(b)(1), unless the  authorization is terminated  or revoked sooner.       Influenza A by PCR NEGATIVE NEGATIVE Final   Influenza B by PCR NEGATIVE NEGATIVE Final    Comment: (NOTE) The Xpert Xpress SARS-CoV-2/FLU/RSV plus assay is intended as an aid in the diagnosis of influenza from Nasopharyngeal swab specimens and should not be used as a sole basis for treatment. Nasal washings and aspirates are unacceptable for Xpert Xpress SARS-CoV-2/FLU/RSV testing.  Fact Sheet for Patients: BloggerCourse.com  Fact Sheet for Healthcare Providers: SeriousBroker.it  This test is not yet approved or cleared by the [place] FDA and has been authorized for detection and/or diagnosis of SARS-CoV-2 by FDA under an Emergency Use Authorization (EUA). This EUA will remain in effect (meaning this test can be used) for the duration of the COVID-19 declaration under Section 564(b)(1) of the Act, 21 U.S.C. section 360bbb-3(b)(1), unless the authorization is terminated or revoked.  Performed at [facility], [address], [place], [place] [address]   MRSA Next Gen by PCR, Nasal     Status: None   Collection Time: 05/30/21 11:39 PM   Specimen: Nasal Mucosa; Nasal Swab  Result Value Ref Range Status   MRSA by PCR Next Gen NOT DETECTED NOT DETECTED Final    Comment: (NOTE) The GeneXpert MRSA Assay (FDA approved for NASAL specimens only), is one component of a comprehensive MRSA colonization surveillance program. It is not intended to diagnose MRSA infection nor to guide or monitor treatment for MRSA infections. Test performance is not FDA approved in patients less than 60 years old. Performed at [facility], [address] [place], [place] [address]      Labs: CBC: No results for input(s): WBC, NEUTROABS, HGB, HCT, MCV, PLT in the last 168 hours.  Basic Metabolic Panel: Recent Labs  Lab 05/31/21 0343  NA 144  K  3.7  CL 114*  CO2 21*  GLUCOSE 111*  BUN 22  CREATININE 0.77  CALCIUM 9.6    Liver Function Tests: No results for input(s): AST, ALT, ALKPHOS, BILITOT, PROT, ALBUMIN in the last 168 hours.  CBG: Recent Labs  Lab 05/31/21 0351  GLUCAP 104*    Hgb A1c No results for input(s): HGBA1C in the last 72 hours.  Lipid Profile No results for input(s): CHOL, HDL, LDLCALC, TRIG, CHOLHDL, LDLDIRECT in the last 72 hours.  Thyroid function studies No results for input(s): TSH, T4TOTAL, T3FREE, THYROIDAB in the last 72 hours.  Invalid input(s): FREET3  Anemia work up No results for input(s): VITAMINB12, FOLATE, FERRITIN, TIBC, IRON, RETICCTPCT in the last 72 hours.  Urinalysis    Component Value Date/Time   COLORURINE AMBER (A) 05/30/2021 1025   APPEARANCEUR CLOUDY (A) 05/30/2021 1025   LABSPEC 1.025 05/30/2021 1025   PHURINE 5.0 05/30/2021 1025   GLUCOSEU NEGATIVE 05/30/2021 1025   HGBUR NEGATIVE 05/30/2021 1025   BILIRUBINUR NEGATIVE 05/30/2021 1025   KETONESUR 80 (A) 05/30/2021 1025   PROTEINUR 30 (A) 05/30/2021 1025   NITRITE NEGATIVE 05/30/2021 1025   LEUKOCYTESUR TRACE (A) 05/30/2021 1025      Time coordinating discharge: 25 minutes  SIGNED:  Marcellus Scott, MD, FACP, [facility]. [facility]  To contact the attending provider between 7A-7P or the covering provider during after hours 7P-7A, please log into the web site [email or web address] and access using universal [place] password for that web site. If you do not have the password, please call the hospital operator.

## 2021-06-06 NOTE — Progress Notes (Addendum)
Report called to Tiffany at [facility] at this time.  Family at bedside and aware of transport to facility.  Had no belongings in room.  Peripheral IV left in place per request of Surveyor, minerals.

## 2021-06-06 NOTE — Progress Notes (Signed)
Nutrition Brief Note  Chart reviewed. Pt now transitioning to comfort care.  No further nutrition interventions planned at this time.  Please re-consult as needed.   Roylene Heaton W, RD, LDN, CDCES Registered Dietitian II Certified Diabetes Care and Education Specialist Please refer to AMION for RD and/or RD on-call/weekend/after hours pager   

## 2021-06-06 NOTE — Progress Notes (Signed)
Daily Progress Note   Patient Name: Brandi Stewart       Date: 06/06/2021 DOB: Jun 04, 1951  Age: 70 y.o. MRN#: [id number] Attending Physician: Elease Etienne, MD Primary Care Physician: Pcp, No Admit Date: 05/30/2021 Length of Stay: 7 days  Reason for Consultation/Follow-up: Disposition, Establishing goals of care, and Non pain symptom management  HPI/Patient Profile:  70 y.o. 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 Code stroke was called, not a tPA candidate (outside window).   Last "normal" was the night prior to presentation. She was able to communicate with family last night, but this AM was acutely non verbal and EMS was called. She has been on ASA and clopidogrel for stroke prophylaxis. Son also state she was able to feed herself last night, but her fine motor skills were not as good as they have been.    She was just admitted to [facility] on 6/23 for acute worsening of right sided weakness and aphasia and found to have an acute subcortical infarct. Neurology followed along and felt this was unrelated to presenting complaints. She has been seen at [facility] for multiple previous strokes as well. In November 2021 she had an acute left thalamoscapuslar junction and left basal ganglia infarct and imaging showed a chronic right MCA territory infarct. EEGs were also obtained without evidence of seizures, but she was placed on Keppra for seizure prophylaxis. She continues to be on this.  Current Medications: Scheduled Meds:  . benzocaine   Mouth/Throat TID  . chlorhexidine  15 mL Mouth Rinse BID  . chlorhexidine  15 mL Mouth Rinse BID  . glycopyrrolate  0.4 mg Intravenous TID  . LORazepam  0.5 mg Intravenous BID  . mouth rinse  15 mL Mouth Rinse q12n4p  . mouth rinse  15 mL Mouth Rinse  q12n4p  . mirtazapine  7.5 mg Oral QHS    Continuous Infusions: . morphine 1 mg/hr (06/05/21 1700)    PRN Meds: acetaminophen **OR** acetaminophen (TYLENOL) oral liquid 160 mg/5 mL **OR** acetaminophen, albuterol, antiseptic oral rinse, glycopyrrolate **OR** glycopyrrolate **OR** glycopyrrolate, haloperidol **OR** haloperidol **OR** haloperidol lactate, LORazepam, morphine injection, morphine, ondansetron **OR** ondansetron (ZOFRAN) IV, polyvinyl alcohol  Subjective:   Subjective: Chart Reviewed. Updates received. Patient Assessed. Created space and opportunity for patient  and family to explore thoughts and feelings regarding current medical situation.  Today's Discussion: Family not available at bedside this morning. Discussed with nursing staff who agrees the patient looks comfortable, no signs of distress. Occasional expiratory moan. Feels her heart is "strong", breathing is regular and unlabored, no apneic episodes.   Review of Systems  Unable to perform ROS: Patient unresponsive   Objective:   Vital Signs: BP 108/84 (BP Location: Left Leg)   Pulse 61   Temp 98.5 F (36.9 C) (Oral)   Resp 15   Ht 5\' 6"  (1.676 m)   Wt 76.5 kg   SpO2 98%   BMI 27.22 kg/m  SpO2: SpO2: 98 % O2 Device: O2 Device: Room Air O2 Flow Rate: O2 Flow Rate (L/min): 0 L/min  Physical Exam: Physical Exam Constitutional:      General: She is not in acute distress. HENT:     Head: Normocephalic and atraumatic.  Cardiovascular:     Rate and Rhythm: Normal rate and regular rhythm.  Heart sounds: Normal heart sounds.  Pulmonary:     Effort: Pulmonary effort is normal. No respiratory distress.     Breath sounds: Normal breath sounds. No stridor. No rhonchi or rales.  Abdominal:     General: Abdomen is flat. There is no distension.     Palpations: Abdomen is soft.  Skin:    General: Skin is warm and dry.  Neurological:     Mental Status: She is unresponsive.    SpO2: SpO2: 98 % O2  Device:SpO2: 98 % O2 Flow Rate: .O2 Flow Rate (L/min): 0 L/min  IO: Intake/output summary:  Intake/Output Summary (Last 24 hours) at 06/06/2021 1004 Last data filed at 06/05/2021 1700 Gross per 24 hour  Intake 6 ml  Output --  Net 6 ml    LBM: Last BM Date: 05/31/21 Baseline Weight: Weight: 77.1 kg Most recent weight: Weight: 76.5 kg   Palliative Assessment/Data:   Assessment & Plan:   Impression: 70 year old female awaiting transfer to [facility] currently on comfort care, actively dying.  No longer tracking or interacting.  She does appear comfortable, no signs of distress or labored breathing.  I feel she is appropriate for transfer today given her current clinical status.  SUMMARY OF RECOMMENDATIONS   Continue comfort care Okay to transfer to [facility] pending bed offer Discussed with nursing staff Called and left a message with [facility] liaison on status of bed Called to discuss with the patient's son, [name]. He expressed appreciation for her care  Code Status: DNR  Goals of Care/Recommendations: Comfort Care Transfer to residential hospice  Symptom Management: Continue Robinul at current dose Continue Morphine gtt at current dose with boluses available for respiratory distress or signs of pain/discomfort Ativan/Haldol prn available for any agitation  Prognosis: Hours - Days  Discharge Planning: Hospice facility  Discussed with: Nursing staff, [name] (son)  Thank you for allowing [place] to participate in the care of Danyal Adorno PMT will continue to support holistically.  Time Total: 35 min  Visit consisted of counseling and education dealing with the complex and emotionally intense issues of symptom management and palliative care in the setting of serious and potentially life-threatening illness. Greater than 50%  of this time was spent counseling and coordinating care related to the above assessment and plan.  Wynne Dust, NP Palliative Medicine  Team  Team Phone # [phone] (Nights/Weekends)  06/06/2021, 10:04 AM   Yong Channel, NP Palliative Medicine Team Pager [phone] (Please see [email or web address] for schedule) Team Phone [phone]    Greater than 50%  of this time was spent counseling and coordinating care related to the above assessment and plan

## 2021-07-03 DEATH — deceased
